# Patient Record
Sex: Female | Born: 1960 | Race: Black or African American | Hispanic: No | Marital: Single | Smoking: Former smoker
Health system: Southern US, Community
[De-identification: ages and names within clinical notes are randomized; demographics above are authoritative.]

## PROBLEM LIST (undated history)

## (undated) DIAGNOSIS — Z9289 Personal history of other medical treatment: Secondary | ICD-10-CM

## (undated) DIAGNOSIS — I10 Essential (primary) hypertension: Secondary | ICD-10-CM

## (undated) DIAGNOSIS — M549 Dorsalgia, unspecified: Secondary | ICD-10-CM

## (undated) DIAGNOSIS — I4891 Unspecified atrial fibrillation: Secondary | ICD-10-CM

## (undated) DIAGNOSIS — R569 Unspecified convulsions: Secondary | ICD-10-CM

## (undated) HISTORY — DX: Personal history of other medical treatment: Z92.89

---

## 1998-02-10 ENCOUNTER — Encounter: Admission: RE | Admit: 1998-02-10 | Discharge: 1998-02-10 | Payer: Self-pay | Admitting: Sports Medicine

## 1998-05-16 ENCOUNTER — Encounter: Admission: RE | Admit: 1998-05-16 | Discharge: 1998-05-16 | Payer: Self-pay | Admitting: Family Medicine

## 1998-06-06 ENCOUNTER — Encounter: Admission: RE | Admit: 1998-06-06 | Discharge: 1998-06-06 | Payer: Self-pay | Admitting: Family Medicine

## 1998-07-02 ENCOUNTER — Encounter: Admission: RE | Admit: 1998-07-02 | Discharge: 1998-07-02 | Payer: Self-pay | Admitting: Family Medicine

## 1998-07-15 ENCOUNTER — Encounter: Admission: RE | Admit: 1998-07-15 | Discharge: 1998-07-15 | Payer: Self-pay | Admitting: Family Medicine

## 1998-07-31 ENCOUNTER — Encounter: Admission: RE | Admit: 1998-07-31 | Discharge: 1998-07-31 | Payer: Self-pay | Admitting: Family Medicine

## 1998-08-15 ENCOUNTER — Encounter: Admission: RE | Admit: 1998-08-15 | Discharge: 1998-08-15 | Payer: Self-pay | Admitting: Family Medicine

## 1998-08-25 ENCOUNTER — Encounter: Admission: RE | Admit: 1998-08-25 | Discharge: 1998-08-25 | Payer: Self-pay | Admitting: Family Medicine

## 1998-09-24 ENCOUNTER — Encounter: Admission: RE | Admit: 1998-09-24 | Discharge: 1998-09-24 | Payer: Self-pay | Admitting: Family Medicine

## 1998-11-19 ENCOUNTER — Encounter: Admission: RE | Admit: 1998-11-19 | Discharge: 1998-11-19 | Payer: Self-pay | Admitting: Family Medicine

## 1999-01-20 ENCOUNTER — Encounter: Payer: Self-pay | Admitting: Emergency Medicine

## 1999-01-20 ENCOUNTER — Emergency Department (HOSPITAL_COMMUNITY): Admission: EM | Admit: 1999-01-20 | Discharge: 1999-01-20 | Payer: Self-pay | Admitting: Emergency Medicine

## 1999-01-28 ENCOUNTER — Ambulatory Visit (HOSPITAL_BASED_OUTPATIENT_CLINIC_OR_DEPARTMENT_OTHER): Admission: RE | Admit: 1999-01-28 | Discharge: 1999-01-28 | Payer: Self-pay | Admitting: Orthopedic Surgery

## 1999-04-08 ENCOUNTER — Encounter: Admission: RE | Admit: 1999-04-08 | Discharge: 1999-04-08 | Payer: Self-pay | Admitting: Family Medicine

## 1999-07-27 ENCOUNTER — Emergency Department (HOSPITAL_COMMUNITY): Admission: EM | Admit: 1999-07-27 | Discharge: 1999-07-27 | Payer: Self-pay | Admitting: Emergency Medicine

## 1999-07-27 ENCOUNTER — Encounter: Payer: Self-pay | Admitting: Emergency Medicine

## 1999-10-05 ENCOUNTER — Encounter: Admission: RE | Admit: 1999-10-05 | Discharge: 1999-10-05 | Payer: Self-pay | Admitting: Family Medicine

## 1999-11-06 ENCOUNTER — Encounter: Admission: RE | Admit: 1999-11-06 | Discharge: 1999-11-06 | Payer: Self-pay | Admitting: Family Medicine

## 2000-02-10 ENCOUNTER — Encounter: Admission: RE | Admit: 2000-02-10 | Discharge: 2000-02-10 | Payer: Self-pay | Admitting: Family Medicine

## 2000-04-26 ENCOUNTER — Encounter: Admission: RE | Admit: 2000-04-26 | Discharge: 2000-04-26 | Payer: Self-pay | Admitting: Family Medicine

## 2000-04-26 ENCOUNTER — Other Ambulatory Visit: Admission: RE | Admit: 2000-04-26 | Discharge: 2000-04-26 | Payer: Self-pay | Admitting: Obstetrics

## 2000-05-04 ENCOUNTER — Encounter: Admission: RE | Admit: 2000-05-04 | Discharge: 2000-05-04 | Payer: Self-pay | Admitting: *Deleted

## 2000-10-20 ENCOUNTER — Encounter: Admission: RE | Admit: 2000-10-20 | Discharge: 2000-10-20 | Payer: Self-pay | Admitting: Family Medicine

## 2001-03-08 ENCOUNTER — Encounter (INDEPENDENT_AMBULATORY_CARE_PROVIDER_SITE_OTHER): Payer: Self-pay | Admitting: *Deleted

## 2001-05-18 ENCOUNTER — Encounter: Admission: RE | Admit: 2001-05-18 | Discharge: 2001-05-18 | Payer: Self-pay | Admitting: Family Medicine

## 2001-06-06 ENCOUNTER — Encounter: Admission: RE | Admit: 2001-06-06 | Discharge: 2001-06-06 | Payer: Self-pay | Admitting: Family Medicine

## 2001-08-09 ENCOUNTER — Encounter: Admission: RE | Admit: 2001-08-09 | Discharge: 2001-08-09 | Payer: Self-pay | Admitting: Family Medicine

## 2001-08-22 ENCOUNTER — Encounter: Admission: RE | Admit: 2001-08-22 | Discharge: 2001-08-22 | Payer: Self-pay | Admitting: *Deleted

## 2001-12-21 ENCOUNTER — Encounter: Admission: RE | Admit: 2001-12-21 | Discharge: 2001-12-21 | Payer: Self-pay | Admitting: Family Medicine

## 2002-01-22 ENCOUNTER — Encounter: Admission: RE | Admit: 2002-01-22 | Discharge: 2002-01-22 | Payer: Self-pay | Admitting: Family Medicine

## 2002-02-09 ENCOUNTER — Encounter: Admission: RE | Admit: 2002-02-09 | Discharge: 2002-02-09 | Payer: Self-pay | Admitting: Family Medicine

## 2002-02-14 ENCOUNTER — Encounter: Admission: RE | Admit: 2002-02-14 | Discharge: 2002-02-14 | Payer: Self-pay | Admitting: General Surgery

## 2002-02-14 ENCOUNTER — Encounter (HOSPITAL_BASED_OUTPATIENT_CLINIC_OR_DEPARTMENT_OTHER): Payer: Self-pay | Admitting: General Surgery

## 2002-02-27 ENCOUNTER — Encounter: Payer: Self-pay | Admitting: Neurology

## 2002-02-27 ENCOUNTER — Observation Stay (HOSPITAL_COMMUNITY): Admission: EM | Admit: 2002-02-27 | Discharge: 2002-02-28 | Payer: Self-pay | Admitting: Emergency Medicine

## 2002-02-27 ENCOUNTER — Encounter: Payer: Self-pay | Admitting: Emergency Medicine

## 2002-04-25 ENCOUNTER — Encounter: Payer: Self-pay | Admitting: Neurology

## 2002-04-25 ENCOUNTER — Ambulatory Visit (HOSPITAL_COMMUNITY): Admission: RE | Admit: 2002-04-25 | Discharge: 2002-04-25 | Payer: Self-pay | Admitting: Neurology

## 2002-06-13 ENCOUNTER — Encounter: Admission: RE | Admit: 2002-06-13 | Discharge: 2002-06-13 | Payer: Self-pay | Admitting: Family Medicine

## 2002-10-03 ENCOUNTER — Encounter: Admission: RE | Admit: 2002-10-03 | Discharge: 2002-10-03 | Payer: Self-pay | Admitting: Family Medicine

## 2002-10-30 ENCOUNTER — Encounter: Payer: Self-pay | Admitting: Obstetrics

## 2002-10-30 ENCOUNTER — Encounter: Admission: RE | Admit: 2002-10-30 | Discharge: 2002-10-30 | Payer: Self-pay | Admitting: Obstetrics

## 2003-01-20 ENCOUNTER — Emergency Department (HOSPITAL_COMMUNITY): Admission: AD | Admit: 2003-01-20 | Discharge: 2003-01-20 | Payer: Self-pay | Admitting: Family Medicine

## 2003-04-23 ENCOUNTER — Ambulatory Visit (HOSPITAL_COMMUNITY): Admission: RE | Admit: 2003-04-23 | Discharge: 2003-04-23 | Payer: Self-pay | Admitting: Neurology

## 2003-04-30 ENCOUNTER — Encounter: Admission: RE | Admit: 2003-04-30 | Discharge: 2003-04-30 | Payer: Self-pay | Admitting: Sports Medicine

## 2004-01-09 ENCOUNTER — Ambulatory Visit: Payer: Self-pay | Admitting: Family Medicine

## 2004-08-21 ENCOUNTER — Emergency Department (HOSPITAL_COMMUNITY): Admission: EM | Admit: 2004-08-21 | Discharge: 2004-08-21 | Payer: Self-pay | Admitting: *Deleted

## 2004-11-19 ENCOUNTER — Ambulatory Visit (HOSPITAL_COMMUNITY): Admission: RE | Admit: 2004-11-19 | Discharge: 2004-11-19 | Payer: Self-pay | Admitting: Obstetrics

## 2005-05-01 ENCOUNTER — Emergency Department (HOSPITAL_COMMUNITY): Admission: EM | Admit: 2005-05-01 | Discharge: 2005-05-01 | Payer: Self-pay | Admitting: Family Medicine

## 2006-05-05 DIAGNOSIS — K219 Gastro-esophageal reflux disease without esophagitis: Secondary | ICD-10-CM

## 2006-05-05 DIAGNOSIS — I10 Essential (primary) hypertension: Secondary | ICD-10-CM

## 2006-05-05 DIAGNOSIS — D649 Anemia, unspecified: Secondary | ICD-10-CM

## 2006-05-05 DIAGNOSIS — G44209 Tension-type headache, unspecified, not intractable: Secondary | ICD-10-CM

## 2006-05-05 DIAGNOSIS — Z720 Tobacco use: Secondary | ICD-10-CM

## 2006-05-06 ENCOUNTER — Encounter (INDEPENDENT_AMBULATORY_CARE_PROVIDER_SITE_OTHER): Payer: Self-pay | Admitting: *Deleted

## 2006-11-20 ENCOUNTER — Emergency Department (HOSPITAL_COMMUNITY): Admission: EM | Admit: 2006-11-20 | Discharge: 2006-11-20 | Payer: Self-pay | Admitting: Emergency Medicine

## 2007-05-07 ENCOUNTER — Encounter: Payer: Self-pay | Admitting: Internal Medicine

## 2007-05-07 ENCOUNTER — Ambulatory Visit: Payer: Self-pay | Admitting: Internal Medicine

## 2007-05-07 ENCOUNTER — Emergency Department (HOSPITAL_COMMUNITY): Admission: EM | Admit: 2007-05-07 | Discharge: 2007-05-07 | Payer: Self-pay | Admitting: Emergency Medicine

## 2007-06-01 ENCOUNTER — Ambulatory Visit: Payer: Self-pay | Admitting: Internal Medicine

## 2007-06-01 LAB — CONVERTED CEMR LAB
BUN: 11 mg/dL (ref 6–23)
Chloride: 101 meq/L (ref 96–112)
Glucose, Bld: 89 mg/dL (ref 70–99)
Potassium: 3.3 meq/L — ABNORMAL LOW (ref 3.5–5.1)

## 2007-06-13 ENCOUNTER — Ambulatory Visit: Payer: Self-pay | Admitting: Internal Medicine

## 2007-06-13 LAB — CONVERTED CEMR LAB
BUN: 12 mg/dL (ref 6–23)
CO2: 31 meq/L (ref 19–32)
Chloride: 101 meq/L (ref 96–112)
Creatinine, Ser: 0.9 mg/dL (ref 0.4–1.2)

## 2007-07-11 ENCOUNTER — Ambulatory Visit: Payer: Self-pay | Admitting: Internal Medicine

## 2007-08-02 ENCOUNTER — Ambulatory Visit: Payer: Self-pay | Admitting: Internal Medicine

## 2008-01-16 ENCOUNTER — Emergency Department (HOSPITAL_COMMUNITY): Admission: EM | Admit: 2008-01-16 | Discharge: 2008-01-16 | Payer: Self-pay | Admitting: Emergency Medicine

## 2008-01-31 ENCOUNTER — Ambulatory Visit: Payer: Self-pay | Admitting: Internal Medicine

## 2008-03-20 ENCOUNTER — Emergency Department (HOSPITAL_COMMUNITY): Admission: EM | Admit: 2008-03-20 | Discharge: 2008-03-20 | Payer: Self-pay | Admitting: Emergency Medicine

## 2008-11-05 DIAGNOSIS — Z87898 Personal history of other specified conditions: Secondary | ICD-10-CM

## 2008-11-05 DIAGNOSIS — D573 Sickle-cell trait: Secondary | ICD-10-CM | POA: Insufficient documentation

## 2008-11-05 DIAGNOSIS — Z8669 Personal history of other diseases of the nervous system and sense organs: Secondary | ICD-10-CM | POA: Insufficient documentation

## 2008-11-05 DIAGNOSIS — I480 Paroxysmal atrial fibrillation: Secondary | ICD-10-CM | POA: Insufficient documentation

## 2008-11-05 DIAGNOSIS — R42 Dizziness and giddiness: Secondary | ICD-10-CM

## 2008-11-15 ENCOUNTER — Telehealth: Payer: Self-pay | Admitting: Internal Medicine

## 2008-11-22 ENCOUNTER — Ambulatory Visit: Payer: Self-pay | Admitting: Internal Medicine

## 2008-11-28 ENCOUNTER — Telehealth: Payer: Self-pay | Admitting: Internal Medicine

## 2009-03-21 ENCOUNTER — Ambulatory Visit: Payer: Self-pay | Admitting: Internal Medicine

## 2009-04-18 ENCOUNTER — Telehealth: Payer: Self-pay | Admitting: Internal Medicine

## 2009-04-25 ENCOUNTER — Telehealth: Payer: Self-pay | Admitting: Internal Medicine

## 2009-05-27 ENCOUNTER — Ambulatory Visit (HOSPITAL_COMMUNITY): Admission: RE | Admit: 2009-05-27 | Discharge: 2009-05-27 | Payer: Self-pay | Admitting: Obstetrics

## 2009-06-24 ENCOUNTER — Emergency Department (HOSPITAL_COMMUNITY): Admission: EM | Admit: 2009-06-24 | Discharge: 2009-06-24 | Payer: Self-pay | Admitting: Emergency Medicine

## 2009-07-22 ENCOUNTER — Encounter: Payer: Self-pay | Admitting: Internal Medicine

## 2009-07-29 ENCOUNTER — Ambulatory Visit: Payer: Self-pay | Admitting: Internal Medicine

## 2009-09-25 ENCOUNTER — Telehealth: Payer: Self-pay | Admitting: Internal Medicine

## 2009-10-03 ENCOUNTER — Encounter: Payer: Self-pay | Admitting: Internal Medicine

## 2009-10-06 ENCOUNTER — Ambulatory Visit: Payer: Self-pay | Admitting: Internal Medicine

## 2009-10-29 ENCOUNTER — Ambulatory Visit: Payer: Self-pay | Admitting: Internal Medicine

## 2009-10-29 DIAGNOSIS — R5383 Other fatigue: Secondary | ICD-10-CM

## 2009-10-29 DIAGNOSIS — R5381 Other malaise: Secondary | ICD-10-CM

## 2009-12-13 ENCOUNTER — Emergency Department (HOSPITAL_COMMUNITY): Admission: EM | Admit: 2009-12-13 | Discharge: 2009-12-13 | Payer: Self-pay | Admitting: Emergency Medicine

## 2010-03-29 ENCOUNTER — Encounter: Payer: Self-pay | Admitting: Obstetrics

## 2010-04-05 LAB — CONVERTED CEMR LAB
Basophils Absolute: 0 10*3/uL (ref 0.0–0.1)
CO2: 28 meq/L (ref 19–32)
Calcium: 9.9 mg/dL (ref 8.4–10.5)
Creatinine, Ser: 0.9 mg/dL (ref 0.4–1.2)
Eosinophils Absolute: 0.1 10*3/uL (ref 0.0–0.7)
Glucose, Bld: 109 mg/dL — ABNORMAL HIGH (ref 70–99)
Hemoglobin: 12.8 g/dL (ref 12.0–15.0)
Lymphocytes Relative: 27.9 % (ref 12.0–46.0)
MCHC: 33.8 g/dL (ref 30.0–36.0)
Monocytes Absolute: 0.5 10*3/uL (ref 0.1–1.0)
Neutro Abs: 4.2 10*3/uL (ref 1.4–7.7)
Neutrophils Relative %: 62.4 % (ref 43.0–77.0)
RDW: 13.8 % (ref 11.5–14.6)

## 2010-04-09 NOTE — Assessment & Plan Note (Signed)
Summary: rov. syncopal, episodes / gd   Visit Type:  Follow-up Primary Provider:  Kellie Shropshire  CC:  was just put on Keppra.  History of Present Illness: [name] is a delightful 50 year old with a history of hypertension and paroxysmal atrial fibrillation who underwent cardioversion in March 2008.  She returns today for routine followup.   Had holter in 9/10 showed PVCs but no AF. No current palpitations, chest pain or shortness of breath.  Had a seizure in April. 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 Then eyes rolled back and had convulsions.  EMS at site said her heart beat was really faint. Taken to ER and brain MRI showed gliosis (no stroke) which was felt to be possible focus for seizure. No evidence of CVA. Started on Keppra. Saw Dr. Marjory Lies in neurology who is planning EEG. Asked her to come back and see [place] to r/o cardiac etiology for episode. Had similar episode of seizures in 2004.  Current Medications (verified): 1)  Benicar Hct 40-25 Mg Tabs (Olmesartan Medoxomil-Hctz) .... Once Daily 2)  Carvedilol 12.5 Mg Tabs (Carvedilol) .... Take One Tablet By Mouth Twice A Day 3)  Aspirin Ec 325 Mg Tbec (Aspirin) .... Take One Tablet By Mouth Daily 4)  Amlodipine Besylate 5 Mg Tabs (Amlodipine Besylate) .... Take One Tablet By Mouth Daily 5)  Keppra 500 Mg Tabs (Levetiracetam) .... Two Times A Day  Allergies (verified): 1)  ! 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 Lurene Shadow, h pylori + 6/03 (treated), ss trait (has had hgb >13 in past) Current Problems:  * CARDIOVERSION TUBAL LIGATION, HX OF (ICD-V26.51) DIZZINESS (ICD-780.4) ATRIAL FIBRILLATION, HX OF (ICD-V12.59) SYNCOPE, HX OF (ICD-V12.49) SICKLE CELL TRAIT (ICD-282.5) FIBROCYSTIC BREAST DISEASE, HX OF (ICD-V13.9) TOBACCO DEPENDENCE (ICD-305.1) TENSION HEADACHE (ICD-307.81) HYPERTENSION, BENIGN SYSTEMIC  (ICD-401.1) GASTROESOPHAGEAL REFLUX, NO ESOPHAGITIS (ICD-530.81) ANEMIA, OTHER, UNSPECIFIED (ICD-285.9)  Review of Systems       As per HPI and past medical history; otherwise all systems negative.   Vital Signs:  Patient profile:   50 year old female Height:      64 inches Weight:      153 pounds BMI:     26.36 Pulse rate:   71 / minute BP sitting:   122 / 80  (left arm) Cuff size:   regular  Vitals Entered By: Hardin Negus, RMA (Jul 29, 2009 4:31 PM)  Physical Exam  General:  Gen: well appearing. no resp difficulty HEENT: normal Neck: supple. no JVD. Carotids 2+ bilat; no bruits. No lymphadenopathy or thryomegaly appreciated. Cor: PMI nondisplaced. Regular rate & rhythm. No rubs, gallops. 2/6 SEM at LSB Lungs: clear Abdomen: soft, nontender, nondistended. Good bowel sounds. Extremities: no cyanosis, clubbing, rash, edema Neuro: alert & orientedx3, cranial nerves grossly intact. moves all 4 extremities w/o difficulty. affect pleasant    Impression & Recommendations:  Problem # 1:  ATRIAL FIBRILLATION, HX OF (ICD-V12.59) This has been quiescent since 2008. Holter monitor without evidence of AF. While I cannot entirely exclude arrhythmia as a cause of her seizure I think it is unlikely. Given that episodes now 7 years apart, placing another monitor on her would be of little value. Thus we have chosen a watch and wait approach. If episode recurs in near future would place 1 month event monitor. We also discussed possiblity of implantable loop recorder but once again given that episodes are 7 years apart this would be low  yield at this point. With CHADs2 score of 1 would continue ASA and not switch to coumadin.   Other Orders: EKG w/ Interpretation (93000)  Patient Instructions: 1)  follow up in 6 months

## 2010-04-09 NOTE — Procedures (Signed)
Summary: Summary Report  Summary Report   Imported By: Erle Crocker 11/19/2009 12:42:07  _____________________________________________________________________  External Attachment:    Type:   Image     Comment:   External Document

## 2010-04-09 NOTE — Miscellaneous (Signed)
Summary: Orders Update  2 week event monitor  Clinical Lists Changes  Orders: Added new Referral order of Event (Event) - Signed

## 2010-04-09 NOTE — Assessment & Plan Note (Signed)
Summary: Z6X   Primary Provider:  Kellie Shropshire   History of Present Illness: [name] is a delightful 50 year old with a history of hypertension and paroxysmal atrial fibrillation who underwent cardioversion in March 2008.  She returns today for routine followup.   Overall, she is doing very well.  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  At the end of the last year was having palpitations. Holter showed PVCs but no AF. No current palpitations, chest pain or shortness of breath. Remains active going to gym 5 days/week on TM, ellipticals and weights.   Problems Prior to Update: 1)  Cardioversion  () 2)  Tubal Ligation, Hx of  (ICD-V26.51) 3)  Dizziness  (ICD-780.4) 4)  Atrial Fibrillation, Hx of  (ICD-V12.59) 5)  Syncope, Hx of  (ICD-V12.49) 6)  Sickle Cell Trait  (ICD-282.5) 7)  Fibrocystic Breast Disease, Hx of  (ICD-V13.9) 8)  Tobacco Dependence  (ICD-305.1) 9)  Tension Headache  (ICD-307.81) 10)  Hypertension, Benign Systemic  (ICD-401.1) 11)  Gastroesophageal Reflux, No Esophagitis  (ICD-530.81) 12)  Anemia, Other, Unspecified  (ICD-285.9)  Current Medications (verified): 1)  Benicar Hct 40-25 Mg Tabs (Olmesartan Medoxomil-Hctz) .... Once Daily 2)  Atenolol 25 Mg Tabs (Atenolol) .... Take One Tablet By Mouth Two Times A Day 3)  Aspirin Ec 325 Mg Tbec (Aspirin) .... Take One Tablet By Mouth Daily  Allergies (verified): 1)  ! 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 Lurene Shadow, h pylori + 6/03 (treated), ss trait (has had hgb >13 in past) Current Problems:  * CARDIOVERSION TUBAL LIGATION, HX OF (ICD-V26.51) DIZZINESS (ICD-780.4) ATRIAL FIBRILLATION, HX OF (ICD-V12.59) SYNCOPE, HX OF (ICD-V12.49) SICKLE CELL TRAIT (ICD-282.5) FIBROCYSTIC BREAST DISEASE, HX OF (ICD-V13.9) TOBACCO DEPENDENCE (ICD-305.1) TENSION HEADACHE (ICD-307.81) HYPERTENSION, BENIGN SYSTEMIC (ICD-401.1) GASTROESOPHAGEAL  REFLUX, NO ESOPHAGITIS (ICD-530.81) ANEMIA, OTHER, UNSPECIFIED (ICD-285.9)  Review of Systems       As per HPI and past medical history; otherwise all systems negative.   Vital Signs:  Patient profile:   50 year old female Height:      64 inches Weight:      156 pounds BMI:     26.87 Pulse rate:   76 / minute BP sitting:   136 / 82  (right arm) Cuff size:   regular  Vitals Entered By: Hardin Negus, RMA (March 21, 2009 4:23 PM)  Physical Exam  General:  Gen: well appearing. no resp difficulty HEENT: normal Neck: supple. no JVD. Carotids 2+ bilat; no bruits. No lymphadenopathy or thryomegaly appreciated. Cor: PMI nondisplaced. Regular rate & rhythm. No rubs, gallops. 2/6 SEM at LSB Lungs: clear Abdomen: soft, nontender, nondistended. Good bowel sounds. Extremities: no cyanosis, clubbing, rash, edema Neuro: alert & orientedx3, cranial nerves grossly intact. moves all 4 extremities w/o difficulty. affect pleasant    Impression & Recommendations:  Problem # 1:  HYPERTENSION, BENIGN SYSTEMIC (ICD-401.1)  BP borderline. Will have her take BPs 2x day for 2 weeks and fax numbers in. If BP still up change atenolol to carvedilol 12.5 two times a day.  Her updated medication list for this problem includes:    Benicar Hct 40-25 Mg Tabs (Olmesartan medoxomil-hctz) ..... Once daily    Atenolol 25 Mg Tabs (Atenolol) .Marland Kitchen... Take one tablet by mouth two times a day    Aspirin Ec 325 Mg Tbec (Aspirin) .Marland Kitchen... Take one tablet by mouth daily  Problem # 2:  ATRIAL FIBRILLATION, HX OF (ICD-V12.59)  No further  episodes. Continue ASA.  Her updated medication list for this problem includes:    Atenolol 25 Mg Tabs (Atenolol) .Marland Kitchen... Take one tablet by mouth two times a day    Aspirin Ec 325 Mg Tbec (Aspirin) .Marland Kitchen... Take one tablet by mouth daily  Problem # 3:  TOBACCO DEPENDENCE (ICD-305.1) Counseled on need to quit.  Patient Instructions: 1)  Your physician has requested that you  regularly monitor and record your blood pressure readings at home.  Please use the same machine at the same time of day to check your readings and record them to bring to your follow-up visit. 2)  Follow up in 1 year

## 2010-04-09 NOTE — Letter (Signed)
Summary: [facility] Office Note  [facility] Office Note   Imported By: Roderic Ovens 08/15/2009 15:23:49  _____________________________________________________________________  External Attachment:    Type:   Image     Comment:   External Document

## 2010-04-09 NOTE — Progress Notes (Signed)
Summary: pt b/p meds not working   Phone Note Call from Patient Call back at [employer] [phone]   Caller: Patient Reason for Call: Talk to Nurse Summary of Call: pt was on Atenolol 50mg  and medication was changed but the new medication is not working her readings are still high and sometimes higher so can the dose be increased since the new medication is a lower dose. She already has a 90day supply of the new meds and she dosen't want to through the meds out. Initial call taken by: Omer Jack,  April 25, 2009 10:30 AM  Follow-up for Phone Call        spoke w/pt she switched atenolol over to carvedilol 12.5 two times a day on Mon 2/14, she is concerned b/c BP cont. to run high around 149-150s/80-100s, will discuss w/Dr Romyn Boswell and call pt back Meredith Staggers, RN  April 25, 2009 10:59 AM   pt rtn your call/lg Omer Jack  April 28, 2009 12:46 PM   Additional Follow-up for Phone Call Additional follow up Details #1::        add norvasc 5qd Dolores Patty, MD, [facility]  April 28, 2009 10:08 AM  Left message to call back Meredith Staggers, RN  April 28, 2009 11:53 AM   pt is aware, new rx sent in [place], RN  April 28, 2009 4:57 PM     New/Updated Medications: AMLODIPINE BESYLATE 5 MG TABS (AMLODIPINE BESYLATE) Take one tablet by mouth daily Prescriptions: AMLODIPINE BESYLATE 5 MG TABS (AMLODIPINE BESYLATE) Take one tablet by mouth daily  #30 x 6   Entered by:   Meredith Staggers, RN   Authorized by:   Dolores Patty, MD, [facility]   Signed by:   Meredith Staggers, RN on 04/28/2009   Method used:   Electronically to        [facility] [phone]* (retail)       [address]       [place], [place]  [address]       Ph: [phone] or [phone]       Fax: [phone]   RxID:   [id number]

## 2010-04-09 NOTE — Progress Notes (Signed)
Summary: blood pressure  Phone Note Call from Patient Call back at Home Phone [phone]   Caller: Patient Reason for Call: Talk to Nurse Details for Reason: Per pt calling, faxing over blood pressure reading, pt is out of her meds, was told that meds might be changed. if meds gonna changed pls [facility] [phone].  Initial call taken by: Lorne Skeens,  April 18, 2009 2:58 PM  Follow-up for Phone Call        lmtcb Scherrie Bateman, LPN  April 18, 2009 3:22 PM  returning call, Migdalia Dk  April 18, 2009 3:34 PM   I called the pt after reviewing her BP readings and they are running about the same as when she was in the office to see Dr Gala Romney.  The pt ran out of her Atenlol today and wanted to get her medication changed today if possible.  I reviewed Dr Ronae Noell's note and he did recommend changing the pt from Atenolol to Carvedilol 12.5mg  two times a day if her BP is still running around 136/82 (this was her BP on 03/21/09).  Rx for Carvedilol sent to the pharmacy.  The pt will stop Atenolol.   Follow-up by: Julieta Gutting, RN, BSN,  April 18, 2009 5:56 PM  Additional Follow-up for Phone Call Additional follow up Details #1::        agree. thanks.  Dolores Patty, MD, [facility]  April 22, 2009 12:55 PM     New/Updated Medications: CARVEDILOL 12.5 MG TABS (CARVEDILOL) Take one tablet by mouth twice a day Prescriptions: CARVEDILOL 12.5 MG TABS (CARVEDILOL) Take one tablet by mouth twice a day  #180 x 3   Entered by:   Julieta Gutting, RN, BSN   Authorized by:   Dolores Patty, MD, [facility]   Signed by:   Julieta Gutting, RN, BSN on 04/18/2009   Method used:   [facility]        [facility] [phone]* (retail)       [address]       [place], [place]  [address]       Ph: [phone] or [phone]       Fax: [phone]   RxID:   [id number]

## 2010-04-09 NOTE — Assessment & Plan Note (Signed)
Summary: 1 month rov. pt had holter monitor 10-06-09/sl   Visit Type:  Follow-up Primary Provider:  Kellie Shropshire  CC:  tired and fatigue.  History of Present Illness: [name] is a delightful 50 year old with a history of hypertension and paroxysmal atrial fibrillation who underwent cardioversion in March 2008.  She returns today for routine followup.   Had holter in 9/10 showed PVCs but no AF. No current palpitations, chest pain or shortness of breath.  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 Then eyes rolled back and had convulsions.  EMS at site said her heart beat was really faint. Taken to ER and brain MRI showed gliosis (no stroke) which was felt to be possible focus for seizure. No evidence of CVA. Started on Keppra. Saw Dr. Marjory Lies in neurology who did EEG which was normal.  Returns today for f/u. No recurrent seizures or syncope. Compliant with all her medicines. Overall doing ok but has periods when she is extremely fatigued. Has not taken her BP during the episodes. No palpitations.   Had Holter monitor earlier this month which I reviewed with her. Sinus rhythm. No arrhythmias.   Current Medications (verified): 1)  Benicar Hct 40-25 Mg Tabs (Olmesartan Medoxomil-Hctz) .... Once Daily 2)  Carvedilol 12.5 Mg Tabs (Carvedilol) .... Take One Tablet By Mouth Twice A Day 3)  Aspirin Ec 325 Mg Tbec (Aspirin) .... Take One Tablet By Mouth Daily 4)  Amlodipine Besylate 5 Mg Tabs (Amlodipine Besylate) .... Take One Tablet By Mouth Daily 5)  Keppra 500 Mg Tabs (Levetiracetam) .... Two Times A Day  Allergies (verified): 1)  ! Pcn  Review of Systems       As per HPI and past medical history; otherwise all systems negative.   Vital Signs:  Patient profile:   50 year old female Height:      64 inches Weight:      150 pounds BMI:     25.84 Pulse rate:   69 / minute BP sitting:   128 / 84  (left arm) Cuff size:    regular  Vitals Entered By: Hardin Negus, RMA (October 29, 2009 9:33 AM)  Physical Exam  General:  Well appearing. no resp difficulty HEENT: normal Neck: supple. no JVD. Carotids 2+ bilat; no bruits. No lymphadenopathy or thryomegaly appreciated. Cor: PMI nondisplaced. Regular rate & rhythm. No rubs, gallops. 2/6 SEM at LSB Lungs: clear Abdomen: soft, nontender, nondistended. Good bowel sounds. Extremities: no cyanosis, clubbing, rash, edema Neuro: alert & orientedx3, cranial nerves grossly intact. moves all 4 extremities w/o difficulty. affect pleasant    Impression & Recommendations:  Problem # 1:  HYPERTENSION, BENIGN SYSTEMIC (ICD-401.1) Blood pressure well controlled. Continue current regimen.  Problem # 2:  FATIGUE / MALAISE (ICD-780.79) Unclear what to make of this. Doesn't appear  to have sleep apnea. Will have her check BP during episodes and will also check baseline labs including CBC and TSH. She will f/u with Dr. Renae Gloss.   Problem # 3:  ATRIAL FIBRILLATION, HX OF (ICD-V12.59) This has been quiescent since 2008. Continue current regimen.   Other Orders: EKG w/ Interpretation (93000) TLB-BMP (Basic Metabolic Panel-BMET) (80048-METABOL) TLB-CBC Platelet - w/Differential (85025-CBCD) TLB-TSH (Thyroid Stimulating Hormone) (84443-TSH) TLB-T4 (Thyrox), Free [phone])  Patient Instructions: 1)  Labs today 2)  Your physician wants you to follow-up in:  1 year.  You will receive a reminder letter in the mail two months in advance. If  you don't receive a letter, please call our office to schedule the follow-up appointment.

## 2010-04-09 NOTE — Progress Notes (Signed)
Summary: pt heart flutter/ would like to see Dr Gala Romney  Phone Note Call from Patient Call back at Home Phone [phone]   Caller: Patient Reason for Call: Talk to Nurse, Talk to Doctor Summary of Call: pt heart is fluttering and it is getting worse and she thinks it's her seizure meds that is causing it and she wants to come in but first avail is Sept and she wants to caome sooner Initial call taken by: Omer Jack,  September 25, 2009 12:02 PM  Follow-up for Phone Call        Left message to call back, if not urgent can see Dr Gala Romney 8/3 at 10:45 Meredith Staggers, RN  September 25, 2009 5:47 PM   RN Left Message To Call Back. Bernita Raisin, RN, BSN  October 02, 2009 11:06 AM  RN s/w Pt concerned re: medication Sheralyn Boatman) for seizures and mentioned a monitor and she needs to see Dr Gala Romney sooner.Marland Kitchen Her heart is still fluttering, beginning to have twitches in calves, toes, lips and cheeks uncontrollably and feels like her vision is blurry @ times. Bernita Raisin, RN, BSN  October 02, 2009 3:01 PM   Additional Follow-up for Phone Call Additional follow up Details #1::        Place 2 week event monitor. Needs to talk to her neurologist about stopping Keppra not me. I will see her after the event monitor is done. Dolores Patty, MD, [facility]  October 02, 2009 7:00 PM      Appended Document: pt heart flutter/ would like to see Dr Gala Romney pt aware of the above.  Aware the order will be placed and Windell Moulding will call her back to schedule monitor placement

## 2010-05-26 LAB — CBC
MCHC: 35 g/dL (ref 30.0–36.0)
Platelets: 195 10*3/uL (ref 150–400)
RBC: 3.98 MIL/uL (ref 3.87–5.11)
RDW: 13.3 % (ref 11.5–15.5)

## 2010-05-26 LAB — BASIC METABOLIC PANEL
BUN: 10 mg/dL (ref 6–23)
CO2: 28 mEq/L (ref 19–32)
Calcium: 8.8 mg/dL (ref 8.4–10.5)
Creatinine, Ser: 0.76 mg/dL (ref 0.4–1.2)
GFR calc Af Amer: >60 mL/min (ref >60)
Glucose, Bld: 92 mg/dL (ref 70–99)

## 2010-05-26 LAB — DIFFERENTIAL
Basophils Absolute: 0 10*3/uL (ref 0.0–0.1)
Basophils Relative: 0 % (ref 0–1)
Eosinophils Absolute: 0.1 10*3/uL (ref 0.0–0.7)
Neutro Abs: 5.5 10*3/uL (ref 1.7–7.7)
Neutrophils Relative %: 69 % (ref 43–77)

## 2010-05-26 LAB — POCT CARDIAC MARKERS
CKMB, poc: <1 ng/mL — ABNORMAL LOW (ref 1.0–8.0)
Myoglobin, poc: 43.4 ng/mL (ref 12–200)

## 2010-05-26 LAB — URINALYSIS, ROUTINE W REFLEX MICROSCOPIC
Glucose, UA: NEGATIVE mg/dL
Hgb urine dipstick: NEGATIVE
Ketones, ur: NEGATIVE mg/dL
Protein, ur: NEGATIVE mg/dL
pH: 7 (ref 5.0–8.0)

## 2010-07-21 NOTE — Assessment & Plan Note (Signed)
[facility]                            CARDIOLOGY OFFICE NOTE   NAME:[name], [name]                     MRN:          [id number]  DATE:01/31/2008                            DOB:          Jun 05, 1960    PRIMARY CARE PHYSICIAN:  Merlene Laughter. Renae Gloss, MD   INTERVAL HISTORY:  Clois Dupes is a delightful 50 year old with a history of  hypertension and paroxysmal atrial fibrillation who underwent  cardioversion in March 2008.  She returns today for routine followup.   Overall, she is doing very well.  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  She does note one episode of brief palpitations that lasted 2-3  minutes while she was at work under a great deal of stress.  Otherwise,  she has not had any other palpitations.  She denies any chest pain or  shortness of breath.   CURRENT MEDICATIONS:  1. Aspirin 325 a day.  2. Atenolol 50 a day.  3. Benicar/hydrochlorothiazide 40/25.  4. Multivitamins.  5. Vitamin B.  6. Estroven.  7. Diclofenac.   PHYSICAL EXAMINATION:  GENERAL:  She is well appearing in no acute  distress, ambulates around in the clinic without any respiratory  difficulty.  VITAL SIGNS:  Blood pressure initially is 124/82, on manual recheck  132/82.  Heart rate is 71.  Weight is 155.  HEENT:  Normal.  NECK:  Supple.  No JVD.  Carotids are 2+ bilaterally without any bruits.  There is no lymphadenopathy or thyromegaly.  CARDIAC:  PMI is nondisplaced.  Regular rate and rhythm.  No murmurs,  rubs, or gallops.  LUNGS:  Clear.  ABDOMEN:  Soft, nontender, nondistended.  No hepatosplenomegaly.  No  bruits.  No masses.  Good bowel sounds.  EXTREMITIES:  Warm with no cyanosis, clubbing, or edema.  No rash.  NEUROLOGIC:  Alert and oriented x3.  Cranial nerves II through XII are  intact.  Moves all 4 extremities without difficulty.  PSYCHIATRIC:  Affect is pleasant.   EKG shows normal sinus rhythm at a rate of 71 with LVH.   ASSESSMENT AND  PLAN:  1. Paroxysmal atrial fibrillation.  This seems quiescent.  Her      congestive heart failure, hypertension, age, diabetes, prior stroke      is 1.  We will keep her on aspirin.  2. Hypertension.  Blood pressure is a bit borderline.  I would like to      see all her systolics under 130.  I have recommended she get home      blood pressure cuff and monitor this more closely with a blood      pressure log and follow up with Dr. Renae Gloss.     Bevelyn Buckles. Bensimhon, MD  Electronically Signed    DRB/MedQ  DD: 01/31/2008  DT: 02/01/2008  Job #: [id number]

## 2010-07-21 NOTE — Assessment & Plan Note (Signed)
[facility]                            CARDIOLOGY OFFICE NOTE   NAME:[name], [name]                     MRN:          [id number]  DATE:06/01/2007                            DOB:          1960/09/24    PRIMARY CARDIOLOGIST:  Dr. Arvilla Meres.   PRIMARY CARE DOCTOR:  Merlene Laughter. Renae Gloss, M.D.   PATIENT PROFILE:  A 50 year old African female with history of  hypertension and paroxysmal atrial fibrillation, who presents for  followup after recent cardioversion problems:  1. Atrial fibrillation.      a.     May 07, 2007, a 2-D echocardiogram, EF 65%, no regional       wall motion abnormalities.  Mildly increased LV wall thickness.       Trivial AI.      b.     Status post successful cardiac cardioversion May 07, 2007.  2. Poorly-controlled hypertension.  3. Ongoing tobacco abuse.   HISTORY OF PRESENT ILLNESS:  A 50 year old African female with the above  problem list.  She was recently hospitalized on May 07, 2007, secondary  to tachy palpitations and was found to be in a-fib, which was reasonably  well rate controlled.  Given the short duration of her a-fib, she was  successfully cardioverted by Dr. Gala Romney and was placed on aspirin  therapy.  She has previously been on atenolol therapy, and this was  continued.  Since her discharge, she has done reasonably well, although  has noted 2 episodes of tachy palpitations, just lasting a couple of  minutes and resolving spontaneously.  She says she could feel it up in  her throat.  There was no associated chest pain or dyspnea.  She  unfortunately continues to smoke, as well as drinking two to three cups  of caffeinated coffee and one caffeinated soft drink a day.  Today, she  is in sinus rhythm at 87 beats a minute.   ALLERGIES:  PENICILLIN.   HOME MEDICATIONS:  1. Aspirin 325 mg daily.  2. Atenolol 50 mg daily.  3. Benicar HCT 40/25 mg daily.   PHYSICAL EXAM:  VITAL SIGNS:  Blood  pressure 160/96, heart rate 87,  respirations 16, weight is 147 pounds.  GENERAL:  Pleasant African-American female in no acute distress, awake  and alert, oriented x3.  HEENT:  Normal.  NEURO:  Grossly intact, nonfocal.  SKIN:  Warm and dry without lesions or masses.  NECK:  No bruits or JVD.  LUNGS:  Respirations are regular, unlabored, clear to auscultation.  CARDIAC:  Regular S1, S2, no S3-S4, murmurs.  ABDOMEN:  Round, soft, nontender, nondistended.  Bowel sounds present  x4.  EXTREMITIES:  Warm, dry, pink.  No clubbing, cyanosis or edema.  Dorsalis pedis, posterior tibial pulses 2+ and equal bilaterally.   ACCESSORY CLINICAL FINDINGS:  Event recorder his pending system.   ASSESSMENT/PLAN:  1. Atrial fibrillation.  The patient is status post cardioversion      earlier this month and is in sinus rhythm today, although she has      had at least two episodes of tachy palpitations.  These were fairly      short-lived, and it is not clear at to whether or not these were      atrial fibrillation.  She is not on Coumadin with an [place] score of      one.  I have gone ahead and ordered a CardioNet to better evaluate      her tachy palpitations and to determine her burden of atrial      fibrillation.  As her resting heart rate is 87, I have also      increased her atenolol 250 mg b.i.d..  Will have her follow up with      Dr. Gala Romney in approximately one month.  2. Hypertension.  This is poorly controlled.  We will start by      increasing atenolol 50 b.i.d. as above.  She remains on Benicar      HCTZ 40/25 daily.  If her pressure remains elevated on followup,      would likely plan to add Norvasc therapy.  3. Disposition.  The patient will wear a CardioNet monitor as above,      follow with Dr. Gala Romney in roughly 1 month.      Nicolasa Ducking, ANP  Electronically Signed      Duke Salvia, MD, [facility]  Electronically Signed   CB/MedQ  DD: 06/01/2007  DT: 06/02/2007  Job  #: [id number]

## 2010-07-21 NOTE — Discharge Summary (Signed)
[name], [name]              ACCOUNT NO.:  [id number]   MEDICAL RECORD NO.:  [id number]          PATIENT TYPE:  INP   LOCATION:  1829                         FACILITY:  [facility]   PHYSICIAN:  Beckey Rutter, MD  DATE OF BIRTH:  1960-06-04   DATE OF ADMISSION:  05/07/2007  DATE OF DISCHARGE:  05/07/2007                               DISCHARGE SUMMARY   PRIMARY CARE PHYSICIAN:  CHIEF COMPLAINT and HISTORY OF PRESENT ILLNESS  This is 50 year old pleasant African-American female with a past medical  history of tobacco abuse presented today with heart tracing.   The patient found to have atrial fibrillation with rate control.  The  patient was seen, and evaluated by Dr. Arvilla Meres, who performed  at cardioversion DC-CV.  This patient, now, is on sinus.  She is  comfortable.  Her first enzymes are negative and she has low-risk for  recurrence.  The patient also has a 2-D echo which is suggestive for  LVH.  The patient is stable for discharge, today.   DISCHARGE DIAGNOSES:  1. Atrial fibrillation with rate control converted to sinus in the      emergency department, today.  2. Hypertension.  3. Tobacco abuse.   DISCHARGE MEDICATIONS:  1. Aspirin 325 mg p.o. daily.  2. Atenolol.  3. Benicar dose as prior to the admission.   DISCHARGE/PLAN:  The patient is discharged, today, to follow up with Dr.  Arvilla Meres as discussed with her.      Beckey Rutter, MD  Electronically Signed     EME/MEDQ  D:  05/07/2007  T:  05/08/2007  Job:  [id number]

## 2010-07-21 NOTE — H&P (Signed)
[name], [name]              ACCOUNT NO.:  [id number]   MEDICAL RECORD NO.:  [id number]          PATIENT TYPE:  INP   LOCATION:  1829                         FACILITY:  [facility]   PHYSICIAN:  Della Goo, M.D. DATE OF BIRTH:  02/16/1961   DATE OF ADMISSION:  05/07/2007  DATE OF DISCHARGE:  05/07/2007                              HISTORY & PHYSICAL   PRIMARY CARE PHYSICIAN:  Cala Bradford R. Renae Gloss, M.D.   CHIEF COMPLAINT:  Palpitations.   HISTORY OF PRESENT ILLNESS:  This is a 50 year old female presenting to  the emergency department with complaints of palpitations which have been  occurring for the past 24 hours.  She reports having shortness of breath  and lightheadedness associated with this.  She denies having any chest  pain, nausea or vomiting.  She denies having any previous similar  episodes.  The patient was evaluated in the emergency department and  found to be in atrial fibrillation.  However, the rate was controlled  with rates of 64-82.  The patient was referred for admission and further  evaluation and treatment of the new atrial fibrillation.   PAST MEDICAL HISTORY:  Significant for hypertension.   PAST SURGICAL HISTORY:  None.   MEDICATIONS:  Include atenolol and Benicar.   ALLERGIES:  To PENICILLIN.   SOCIAL HISTORY:  The patient is a smoker, smokes 1-1/2 packs per day for  20 years.  She also drinks socially 3-4 beers on weekends.   FAMILY HISTORY:  Positive for sickle cell disease in her mother and a  sister with a sickle cell variant.  No history of coronary artery  disease in her family.  Positive hypertension in her mother and 2  brothers.  No diabetes in her family.  No cancer in her family that she  knows of.   REVIEW OF SYSTEMS:  Pertinents as mentioned above.   PHYSICAL EXAMINATION:  GENERAL:  This is a 50 year old well-nourished,  well-developed female in no acute distress currently.  VITAL SIGNS:  Her vital signs are temperature 97.4,  blood pressure  initially 174/110, heart rate 64, respirations 16.  The patient was  administered labetalol x1 dose and her blood pressure did improve and  decreased to 151/103 with a heart rate of 82.  HEENT:  Normocephalic, atraumatic.  Pupils equally round and reactive to  light.  Extraocular muscles are intact.  Funduscopic benign.  Oropharynx  is clear.  NECK:  Neck is supple, full range of motion.  No thyromegaly, adenopathy  or jugular venous distention.  HEART:  Regular rhythm.  No murmurs, gallops or rubs.  LUNGS:  Clear to auscultation bilaterally.  ABDOMEN:  Positive bowel sounds, soft, nontender, nondistended.  EXTREMITIES:  Without cyanosis, clubbing or edema.  NEUROLOGICAL:  Examination nonfocal.   LABORATORY STUDIES:  Hemoglobin 13.9 on ISTAT and hematocrit 41.0,  sodium 139, potassium 3.5, chloride 108, bicarb 23.5, BUN 12, creatinine  1.0 and glucose 101.  Pro-time 12.4, INR 0.9.  Cardiac enzymes with a  myoglobin of 28.6, CK-MB less than 1.0, troponin less than 0.05.  EKG  reveals atrial fibrillation which as reported above is  rate controlled.  Also there are ST-segment changes but these were present on previous EKG  with hospitalization.   ASSESSMENT:  A 50 year old female being admitted with:  1. Palpitations.  2. New atrial fibrillation.  3. Hypertension.   PLAN:  The patient will be admitted to the telemetry area for cardiac  monitoring.  Oral Cardizem therapy has been ordered q.6 h. 30 mg  tablets.  IV Lopressor has also been ordered p.r.n.  A cardiology  consultation will also be placed.  Full dose Lovenox therapy and the  Coumadin protocol will be initiated.  A 2-D echo study has also been  ordered.      Della Goo, M.D.  Electronically Signed     HJ/MEDQ  D:  05/07/2007  T:  05/08/2007  Job:  [id number]   cc:   Merlene Laughter. Renae Gloss, M.D.

## 2010-07-21 NOTE — H&P (Signed)
[name], [name]              ACCOUNT NO.:  [id number]   MEDICAL RECORD NO.:  [id number]          PATIENT TYPE:  INP   LOCATION:  1829                         FACILITY:  [facility]   PHYSICIAN:  [name], MDDATE OF BIRTH:  1961/02/21   DATE OF ADMISSION:  05/07/2007  DATE OF DISCHARGE:  05/07/2007                              HISTORY & PHYSICAL   CARDIOLOGIST:  New, being seen by Dr. Gala [name].   PRIMARY CARE PHYSICIAN:  [name].   We were asked by [facility] services to evaluate this 50 year old African-  American female with new onset atrial fib, around 7 o'clock Saturday  evening.  [name] states she was sitting watching TV, had sudden  onset of fluttering in her chest.  She described it as a sporadic  sensation that took her breath away.  She states she stood up to go the  bathroom, she got dizzy.  It seemed like the palpitations were worse  when she was moving around, tried to sleep last night, unable to rest  because of the fluttering in her chest.  She came to the emergency room  for further evaluation.  In the ER, she was found to be in atrial fib at  a rate of 69, with a blood pressure of 171/110.  The patient was treated  with labetalol.  Lovenox and warfarin was started by [employer]  physician, also.  [name] states she has had bronchitis three times  this winter, just recently 2 weeks ago was her last case.  She is also  concerned about anticoagulation.  She states her mother has sickle cell  disease, has some sort of cardiac abnormality, was put on heparin and  started bleeding while she was on heparin, but apparently is on Coumadin  and has had no problems with the Coumadin.  She also states she had a  sister with sickle cell trait, who started having some GI bleeding in  her stomach, not sure of etiology, states her sister is being worked up  for this, but they think she has a type of sickle cell.   ALLERGIES:  PENICILLIN.   AT HOME  MEDICATIONS:  Includes Atenolol and Benicar, unclear doses right  now.   PAST MEDICAL HISTORY:  Includes hypertension.  [name] states she  developed it during the pregnancy of her last child 12 years ago and  sickle cell trait, fibrocystic breast disease, history of syncope x1  associated with vertigo and viral illness in 2002.  She is also status  post tubal ligation.   SOCIAL HISTORY:  She lives in [place].  She lives with her two  daughters.  She works in Presenter, broadcasting for the [employer]  She smokes one pack of cigarettes a day, drinks EtOH on the weekend.  She states average of 4 beers, denies any drug or herbal medication use.   FAMILY HISTORY:  Mother alive with some type of cardiac workup.  Ms.  [name] is not sure.  She states she does have sickle cell disease.  Father deceased, unknown.  Siblings with known sickle cell trait.  REVIEW OF SYSTEMS:  Positive for sweats, shortness of breath,  palpitations, dizziness, numbness in extremities and lips at occasions.  All other systems reviewed and negative.   PHYSICAL EXAM:  VITAL SIGNS:  Temperature 97.4,  pulse 64-79, heart rate  16, blood pressure currently 144/95.  GENERAL:  In no acute distress.  HEENT:  Normal.  NECK:  Supple without lymphadenopathy, no bruit, no JVD.  CARDIOVASCULAR:  Exam reveals S1-S2, irregular rhythm.  LUNGS:  Clear to auscultation.  ABDOMEN:  Soft, nontender, positive bowel sounds.  LOWER EXTREMITIES:  Without clubbing, cyanosis or edema.  NEUROLOGICAL:  Alert and oriented x3.  Chest x-ray showing no acute  findings.   EKG appears to be fib flutter at a rate of 69, H&H 13.9 and 41.  Sodium  139, potassium 3.5, BUN 12, creatinine 1.0, glucose 101. Point of cares  negative x1, first set of enzymes, troponin less than 0.01, PT/INR 12.4  and 0.9.   IMPRESSION:  1. Fib flutter, new onset with a [place] score of 1, rate currently      controlled.  2. Hypertension.  3. Ongoing  tobacco use.   PLAN:  To check a TSH, a 2-D echocardiogram, check a D-dimer, check a  urine drug screen, keep the patient n.p.o., consider cardioversion. Will  defer to Dr. Gala [name], who has been in to examine and assess patient  with reasonable plan of care.      [name], ACNP      [name] Buckles. Bensimhon, MD  Electronically Signed    MB/MEDQ  D:  05/07/2007  T:  05/07/2007  Job:  [id number]

## 2010-07-21 NOTE — Assessment & Plan Note (Signed)
 HEALTHCARE                            CARDIOLOGY OFFICE NOTE   NAME:Dasher, Ria Clock                     MRN:          [id number]  DATE:07/11/2007                            DOB:          [birth year]    PRIMARY CARE PHYSICIAN:  Merlene Laughter. Renae Gloss, M.D.   INTERVAL HISTORY:  [name] is a delightful 50 year old woman with  history of hypertension and paroxysmal atrial fibrillation as well as  tobacco use who recently underwent cardioversion for paroxysmal atrial  fibrillation on May 07, 2007.  She returns today for routine followup.   She has been doing great.  She wore a monitor with no episodes of atrial  fibrillation.  She was at the gym working out.  She is limiting her  caffeine intake.  She has not had any problems.   CURRENT MEDICATIONS:  1. Aspirin 325 a day.  2. Atenolol 50 a day.  3. Benicar/hydrochlorothiazide 40/25.  4. Multivitamin.  5. Iron.   PHYSICAL EXAMINATION:  GENERAL:  She is well appearing, no acute  distress.  VITAL SIGNS:  Blood pressure is 112/72, heart rate 68. Weight is 153.  HEENT:  Normal.  NECK:  Supple.  No JVD.  Carotid 2+ bilaterally without bruits, no  lymphadenopathy or thyromegaly.  CARDIAC:  Regular rate and rhythm.  No murmurs, rubs or gallops.  LUNGS:  Clear.  ABDOMEN:  Soft, nontender, nondistended.  No hepatosplenomegaly, no  bruits, no masses.  Good bowel sounds.  EXTREMITIES:  Warm with no cyanosis, clubbing or edema.  No rash.  NEUROLOGIC:  Alert and x3.  Cranial nerves II-XII are intact.  Moves all  four extremities without difficulty.  Affect is very pleasant.   Review of monitor shows sinus rhythm with no evidence of atrial  fibrillation.   ASSESSMENT AND PLAN:  1. Paroxysmal atrial fibrillation.  This has not recurred.  Her CHADS      score is 1.  We previously discussed aspirin versus Coumadin.  At      this point, we have decided to proceed with full-dose aspirin.      Should she have  recurrence, we may consider Coumadin.  2. Hypertension, well controlled.   DISPOSITION:  Will see her back for routine followup in 6 months.  She  knows to get in touch with me should she have recurrent palpitations or  any other problems.     Bevelyn Buckles. Bensimhon, MD  Electronically Signed    DRB/MedQ  DD: 07/11/2007  DT: 07/11/2007  Job #: [id number]   cc:   Merlene Laughter. Renae Gloss, M.D.

## 2010-07-21 NOTE — Op Note (Signed)
NAMEMARIANE, [name]              ACCOUNT NO.:  [id number]   MEDICAL RECORD NO.:  [id number]          PATIENT TYPE:  INP   LOCATION:  1829                         FACILITY:  [facility]   PHYSICIAN:  Bevelyn Buckles. Bensimhon, MDDATE OF BIRTH:  [birth year]   DATE OF PROCEDURE:  05/07/2007  DATE OF DISCHARGE:  05/07/2007                               OPERATIVE REPORT   TYPE OF PROCEDURE:  Electrical cardioversion.   INDICATION:  New-onset atrial fibrillation.   PATIENT IDENTIFICATION:  [name] is a 50 year old woman with  paroxysmal atrial fibrillation.  She developed symptomatic palpitations  last night.  She came in and was found to be in atrial fibrillation with  a relatively controlled ventricular response.  Echocardiogram showed  normal LV function with moderate LVH.  We initially considered  attempting to cardiovert her with flecainide but given her LVH I felt IC  antiarrhythmics were relatively contraindicated.  She has been treated  with Lovenox and aspirin here.  I have discussed the risks and benefits  of cardioversion with her including a very small risk of stroke.  She  has agreed to proceed.   PROCEDURE DESCRIPTION:  She was sedated by Dr. Gelene Mink in  anesthesiology with 250 mg of sodium Pentothal.  After adequate  anesthesia was achieved she was given a single biphasic 150-joule shock  with prompt conversion to normal sinus rhythm.  There are no immediate  complications.   DISPOSITION:  She will be discharged home today and follow up with me in  clinic in 2 weeks.  Given that her CHADS score was 1 we decided to  forego Coumadin anticoagulation and will treat her with full-dose  aspirin 325 mg a day.      Bevelyn Buckles. Bensimhon, MD  Electronically Signed     DRB/MEDQ  D:  05/07/2007  T:  05/07/2007  Job:  [id number]

## 2010-07-24 NOTE — H&P (Signed)
NAME:  [name], [name]                        ACCOUNT NO.:  [id number]   MEDICAL RECORD NO.:  [id number]                   PATIENT TYPE:  EMS   LOCATION:  MAJO                                 FACILITY:  [facility]   PHYSICIAN:  Gustavus Messing. Orlin Hilding, M.D.          DATE OF BIRTH:  February 02, 1961   DATE OF ADMISSION:  02/27/2002  DATE OF DISCHARGE:                                HISTORY & PHYSICAL   CHIEF COMPLAINT:  Passing out an dizzy.   HISTORY OF PRESENT ILLNESS:  The patient is a 50 year old right-handed black  woman with a history of hypertension.  She is generally in good health.  Last night she went to bed as usual, woke up at about 1 o'clock in the  morning to go to the bathroom, felt hot and dizzy and then passed out.  She  came to quickly, unclear if she hit her head.  No incontinence, no tongue  laceration.  She tried to get up but felt dizzy again and had the sensation  of falling. She crawled into her bed to rest awhile, tried to get up and go  to the living room and once again felt dizzy like she was falling.  She also  had some nausea and vomiting.  She called her boyfriend to come over and  take her to the hospital.   REVIEW OF SYSTEMS:  She complains of a tender spot in the right posterior  head region for the last three days or so prior to this event.  Some vague  vision blurring, nothing specific  No true diplopia or amaurosis.  No chest  pain, shortness of breath, GI pain.  No symptoms compatible with transverse  myelitis or anything to suggest MS.   PAST MEDICAL HISTORY:  1. Hypertension.  2. Fibrocystic breast disease.  3. Sickle cell trait.  4. Recent URI.   MEDICATIONS:  1. Atenolol 25 mg a day.  2. Hydrochlorothiazide 25 mg a day.   ALLERGIES:  PENICILLIN.   SOCIAL HISTORY:  Lives with daughter, is a cigarette user of about one-half  pack a day.  Drinks beer and caffeine.  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   FAMILY  HISTORY:  Positive for sickle cell disease.   PHYSICAL EXAMINATION:  VITAL SIGNS:  Temperature T-max 100, blood pressure  107/61, pulse 71, respirations 20, 97% saturation.  HEENT:  Head is normocephalic and atraumatic.  NECK:  Supple without bruits.  HEART: Regular rate and rhythm without murmurs.  LUNGS:  Clear to auscultation.  EXTREMITIES:  Without edema.  ABDOMEN:  Positive bowel sounds, soft and nontender.  NEUROLOGIC:  Mental Status: She is awake and alert.  Normal language.  She  is oriented and appropriate. Cranial Nerves:  Pupils are equal and reactive.  Disk margins are sharp. Visual fields full to confrontation.  Extraocular  movements are intact without nystagmus ophthalmoparesis, or psychosis.  Facial sensation is normal (  7) facial motor activity is intact without droop  or  asymmetry.  Hearing is intact to finger scratch and voice.  (9, 10, 12)  Palate symmetric and tongue midline.  (11)  She has normal shoulder shrug.  Motor exam.  She has normal bulk, tone, and strength throughout with 5/5  strength in all four extremities.  No drift or satelliting, fasciculations,  atrophy, or tremor.  Reflexes 1+ with downgoing toes.  Coordination finger-  to-nose, rapid alternating movements, heel-to-shin are normal.  Sensory exam  is intact to light tough, temperature, and vibration.   LABORATORY DATA:  MRI of the brain is essentially normal except for a blush  or cloud in the right temporooccipital juncture which seems to have pre-  contrast increased signal in the T1  radiofrequency, cannot tell if it  enhances.  Also noted increased signal on flare.  Unclear of the  significance of this.  Posterior fossa is normal.   MRA is normal.   White blood cell count is 8.8, hemoglobin 12.3, hematocrit 36.1, platelets  238.  Sodium 138, potassium 3.6, chloride 108, CO2 23, BUN 14, creatinine  0.7, glucose 97, calcium 9.4, protein 7.5, albumin 3.9.  SGOT 23, SGPT 23,  alkaline  phosphatase 33, bilirubin 1.0.  PTT 26, PT 13.0, INR 0.9.   IMPRESSION:  Syncope, dizziness, vertigo. Rule out seizure.  Rule out  neurocardiogenic syncope.  Unclear if the a brain cloud on MRI is  significant or relevant.   PLAN:  Admit to telemetry.  Check EKG, EEG, sickle cell count, and  orthostatics.  Hopefully we will able to discharge if no further events.  She will need a followup MRI in two to three months with magnetization  transfer pre-contrast done.                                               Catherine A. Orlin Hilding, M.D.    CAW/MEDQ  D:  02/27/2002  T:  02/27/2002  Job:  [id number]

## 2010-07-24 NOTE — Discharge Summary (Signed)
NAME:  KRIMSON, MASSMANN                        ACCOUNT NO.:  [id number]   MEDICAL RECORD NO.:  [id number]                   PATIENT TYPE:  INP   LOCATION:  3030                                 FACILITY:  [facility]   PHYSICIAN:  Candy Sledge, M.D.            DATE OF BIRTH:  [birth year]   DATE OF ADMISSION:  02/27/2002  DATE OF DISCHARGE:  02/28/2002                                 DISCHARGE SUMMARY   BRIEF HISTORY:  For complete details of Chief Complaint and History of  Present Illness, Past Medical History, and Physical Examination, please  review Dr. Stark Klein admission History and Physical.   Briefly, the patient is a 50 year old, right-handed patient with a history  of hypertension.  She has generally been in good health.  She presented on  the morning of admission after awakening earlier that morning and  complaining of feeling hot and dizzy, then passing out.  She had some  subsequent episodes of dizziness as though she were falling. This was  associated with some nausea and vomiting.  She had been complaining of a  tender spot over the right posterior head region for about three days prior  to admission and some vague blurriness of vision.   Her examination on admission showed low-grade temperature of 100 degrees.  Blood pressure 107/61, pulse 71, respirations 20, O2 saturation 97%.  Her  neurological examination was nonfocal.   The patient did undergo an MRI of the brain at time of admission showing a  blush in the right temporooccipital junction which had increased signal on  pre-contrast and post-contrast T1 as well as on the flair study.  The  patient was admitted with a diagnosis of syncope/dizziness/vertigo/rule out  seizure.   LABORATORY DATA:  Admission CBC and differential showed white cell count  8200, hemoglobin 12.3, hematocrit 36.1, platelet count 238,000.  There were  68% neutrophils, 22% lymphocytes, 9% monocytes. Comprehensive metabolic  panel  was normal apart from low alkaline phosphatase 33 units/liter.  PT and  PTT were 13 and 26 seconds, respectively.  Urinalysis showed 0 to 2 wbc's, 0  to 2 rbc's, and leukocyte esterase negative.   CT scan of the head 02/27/2002 showed no evidence of acute intracranial  abnormality.   Telemetry strips showed normal sinus rhythm.  The patient had an MRI of the  brain as noted above.   She also underwent an EEG on 02/28/2002 which was entirely normal during the  racing state.   HOSPITAL COURSE:  The patient was admitted to the neurosciences unit.  Vital  signs were generally very acceptable with low-normal blood pressures.  T-max  on the day of discharge was 99 with subsequent temperatures being normal.  The patient had a good appetite. She had no further episodes of  dizziness/vertigo or witnessed seizure-like activity.  She was felt to have  arrived at maximum hospital benefit and was discharged to home.   I  should mention that, as part of her laboratory workup, she did have a  sickle cell panel which returned positive at this time.   In light of her abnormal MRI, the patient will be followed closely by Dr.  Orlin Hilding who plans a repeat MRI in about two months.  She was instructed not  to drive until her followup visit and advised not to drink more than 2 beers  per day or otherwise engage in dangerous activities.   DISCHARGE DIAGNOSES:  1. Syncope/vertigo, question etiology.  2. Abnormal MRI of right temporooccipital junction, to be followed.   DISCHARGE MEDICATIONS:  1. Atenolol 25 mg p.o. q.d.  2. HCTZ 25 mg p.o. q.d.  3. Patient's usual vitamin supplements.   DISPOSITION:  She is discharged to home with instruction to followup with  Demetrio Lapping, P. A. or Dr. Orlin Hilding in three to four weeks.  Other  restrictions as noted above.   CONDITION ON DISCHARGE:  Stable.  Prognosis fair to good.                                               Candy Sledge, M.D.     JJS/MEDQ  D:  02/28/2002  T:  03/02/2002  Job:  [id number]   cc:   [facility] Teaching Service   Santina Evans A. Orlin Hilding, M.D.  [address]  [address]  [place]  [place] [address]  Fax: [phone]

## 2010-10-26 ENCOUNTER — Other Ambulatory Visit: Payer: Self-pay | Admitting: Internal Medicine

## 2010-12-01 LAB — I-STAT 8, (EC8 V) (CONVERTED LAB)
BUN: 12 mg/dL (ref 6–23)
Glucose, Bld: 101 mg/dL — ABNORMAL HIGH (ref 70–99)
Hemoglobin: 13.9 g/dL (ref 12.0–15.0)
Potassium: 3.5 mEq/L (ref 3.5–5.1)
Sodium: 139 mEq/L (ref 135–145)
TCO2: 24 mmol/L (ref 0–100)
pH, Ven: 7.508 — ABNORMAL HIGH (ref 7.250–7.300)

## 2010-12-01 LAB — CK TOTAL AND CKMB (NOT AT ARMC)
CK, MB: 0.8 ng/mL (ref 0.3–4.0)
Relative Index: 0.6 (ref 0.0–2.5)
Total CK: 138 U/L (ref 7–177)

## 2010-12-01 LAB — RAPID URINE DRUG SCREEN, HOSP PERFORMED
Barbiturates: NOT DETECTED
Benzodiazepines: NOT DETECTED

## 2010-12-01 LAB — CBC
Platelets: 204 10*3/uL (ref 150–400)
RBC: 4.3 MIL/uL (ref 3.87–5.11)
WBC: 7.1 10*3/uL (ref 4.0–10.5)

## 2010-12-01 LAB — TROPONIN I: Troponin I: <0.01 ng/mL (ref 0.00–0.06)

## 2010-12-01 LAB — POCT I-STAT CREATININE
Creatinine, Ser: 1 mg/dL (ref 0.5–1.7)
Operator id: 222501

## 2010-12-01 LAB — POCT CARDIAC MARKERS
CKMB, poc: <1 ng/mL — ABNORMAL LOW (ref 1.0–8.0)
Operator id: 222501
Troponin i, poc: <0.05 ng/mL (ref 0.00–0.05)

## 2010-12-01 LAB — DIFFERENTIAL
Basophils Absolute: 0 10*3/uL (ref 0.0–0.1)
Basophils Relative: 1 % (ref 0–1)
Eosinophils Absolute: 0.2 10*3/uL (ref 0.0–0.7)
Monocytes Relative: 9 % (ref 3–12)
Neutro Abs: 3.8 10*3/uL (ref 1.7–7.7)
Neutrophils Relative %: 54 % (ref 43–77)

## 2010-12-01 LAB — TSH: TSH: 3.63 m[IU]/mL (ref 0.350–5.500)

## 2010-12-01 LAB — PROTIME-INR: INR: 0.9 (ref 0.00–1.49)

## 2010-12-01 LAB — D-DIMER, QUANTITATIVE: D-Dimer, Quant: <0.22 ug/mL-FEU (ref 0.00–0.48)

## 2010-12-18 ENCOUNTER — Inpatient Hospital Stay (INDEPENDENT_AMBULATORY_CARE_PROVIDER_SITE_OTHER)
Admission: RE | Admit: 2010-12-18 | Discharge: 2010-12-18 | Disposition: A | Discharge status: Home / Self Care | Payer: Self-pay | Source: Ambulatory Visit | Attending: Family Medicine | Admitting: Family Medicine

## 2010-12-18 DIAGNOSIS — N760 Acute vaginitis: Secondary | ICD-10-CM

## 2010-12-18 LAB — POCT PREGNANCY, URINE: Preg Test, Ur: NEGATIVE

## 2010-12-18 LAB — POCT URINALYSIS DIP (DEVICE)
Bilirubin Urine: NEGATIVE
Glucose, UA: NEGATIVE mg/dL
Hgb urine dipstick: NEGATIVE
Ketones, ur: NEGATIVE mg/dL
Nitrite: NEGATIVE
Protein, ur: NEGATIVE mg/dL
Specific Gravity, Urine: 1.02 (ref 1.005–1.030)
Urobilinogen, UA: 0.2 mg/dL (ref 0.0–1.0)
pH: 5.5 (ref 5.0–8.0)

## 2010-12-18 LAB — WET PREP, GENITAL
Trich, Wet Prep: NONE SEEN
Yeast Wet Prep HPF POC: NONE SEEN

## 2010-12-19 LAB — GC/CHLAMYDIA PROBE AMP, GENITAL
Chlamydia, DNA Probe: NEGATIVE
GC Probe Amp, Genital: NEGATIVE

## 2011-01-20 ENCOUNTER — Other Ambulatory Visit (HOSPITAL_COMMUNITY): Payer: Self-pay | Admitting: Internal Medicine

## 2011-01-20 DIAGNOSIS — Z1231 Encounter for screening mammogram for malignant neoplasm of breast: Secondary | ICD-10-CM

## 2011-02-26 ENCOUNTER — Ambulatory Visit (HOSPITAL_COMMUNITY): Payer: Self-pay | Attending: Internal Medicine

## 2011-03-26 ENCOUNTER — Encounter (HOSPITAL_COMMUNITY): Payer: Self-pay | Admitting: Emergency Medicine

## 2011-03-26 ENCOUNTER — Emergency Department (HOSPITAL_COMMUNITY)
Admission: EM | Admit: 2011-03-26 | Discharge: 2011-03-26 | Disposition: A | Discharge status: Still In Hospital | Payer: Self-pay | Source: Home / Self Care | Attending: Emergency Medicine | Admitting: Emergency Medicine

## 2011-03-26 ENCOUNTER — Emergency Department (INDEPENDENT_AMBULATORY_CARE_PROVIDER_SITE_OTHER)
Admission: EM | Admit: 2011-03-26 | Discharge: 2011-03-26 | Disposition: A | Discharge status: Home / Self Care | Payer: Self-pay | Source: Home / Self Care | Attending: Emergency Medicine | Admitting: Emergency Medicine

## 2011-03-26 DIAGNOSIS — M62830 Muscle spasm of back: Secondary | ICD-10-CM

## 2011-03-26 DIAGNOSIS — M5380 Other specified dorsopathies, site unspecified: Secondary | ICD-10-CM

## 2011-03-26 HISTORY — DX: Dorsalgia, unspecified: M54.9

## 2011-03-26 HISTORY — DX: Unspecified convulsions: R56.9

## 2011-03-26 HISTORY — DX: Essential (primary) hypertension: I10

## 2011-03-26 HISTORY — DX: Unspecified atrial fibrillation: I48.91

## 2011-03-26 MED ORDER — DIAZEPAM 5 MG PO TABS: 5.0000 mg | ORAL_TABLET | Freq: Four times a day (QID) | ORAL | Status: AC | PRN

## 2011-03-26 MED ORDER — HYDROCODONE-ACETAMINOPHEN 5-325 MG PO TABS: 2.0000 | ORAL_TABLET | ORAL | Status: AC | PRN

## 2011-03-26 MED ORDER — IBUPROFEN 600 MG PO TABS: 600.0000 mg | ORAL_TABLET | Freq: Four times a day (QID) | ORAL | Status: AC | PRN

## 2011-03-26 MED ORDER — LORAZEPAM 2 MG/ML IJ SOLN
INTRAMUSCULAR | Status: AC
  Filled 2011-03-26: qty 1

## 2011-03-26 MED ORDER — LORAZEPAM 2 MG/ML IJ SOLN
1.0000 mg | Freq: Once | INTRAMUSCULAR | Status: AC
  Administered 2011-03-26: 1 mg via INTRAMUSCULAR

## 2011-03-26 NOTE — ED Notes (Signed)
ALL INFORMATION DELETED ON THIS PT DUE TO DIFFERENT BIRTHDATE AND NEW CHART CREATED.= WITH DIFFERENT MEDICAL RECORD NUMBER

## 2011-03-26 NOTE — ED Provider Notes (Signed)
History     CSN: [id number]  Arrival date & time 03/26/11  1031   None     Chief Complaint  Patient presents with  . Spasms  . Back Pain    (Consider location/radiation/quality/duration/timing/severity/associated sxs/prior treatment) HPI Comments: Patient with left sided mid thoracic and left lumbar pain described as "spasms" over the past 2 days patient states she feels a "catch" when she rotates her torso, walks, bends forward. States that the muscles in between her ribs and down the left side of her back then start spasming. States that the pain is now starting to go into her right lower back . Reports some shortness of breath during spasms. No other shortness of breath. No nausea, vomiting, diaphoresis, radiation to neck or to arm, palpitations. No wheezing, cough, fevers. No exertional component. Has tried leftover prescription of Flexeril, ibuprofen 400 mg without relief. Has had multiple episodes of identical symptoms in the past since injuring her back at work years ago.  ROS as noted in HPI. All other ROS negative.   Patient is a 51 y.o. female presenting with back pain.  Back Pain  This is a recurrent problem. The current episode started 2 days ago. The problem has been gradually worsening. The pain is associated with twisting. The pain is present in the thoracic spine and lumbar spine. The pain does not radiate. The symptoms are aggravated by bending, twisting and certain positions. The pain is worse during the day. Pertinent negatives include no chest pain, no fever, no numbness, no weight loss, no headaches, no abdominal pain, no abdominal swelling, no bowel incontinence, no perianal numbness, no bladder incontinence, no dysuria, no pelvic pain, no leg pain, no paresthesias, no paresis, no tingling and no weakness. She has tried NSAIDs and muscle relaxants for the symptoms. The treatment provided no relief.    Past Medical History  Diagnosis Date  . Atrial fibrillation   .  Hypertension   . Back pain   . Seizures     Past Surgical History  Procedure Date  . Cesarean section     History reviewed. No pertinent family history.  History  Substance Use Topics  . Smoking status: Current Everyday Smoker  . Smokeless tobacco: Not on file  . Alcohol Use: Yes    OB History    Grav Para Term Preterm Abortions TAB SAB Ect Mult Living                  Review of Systems  Constitutional: Negative for fever and weight loss.  Cardiovascular: Negative for chest pain.  Gastrointestinal: Negative for abdominal pain and bowel incontinence.  Genitourinary: Negative for bladder incontinence, dysuria and pelvic pain.  Musculoskeletal: Positive for back pain.  Neurological: Negative for tingling, weakness, numbness, headaches and paresthesias.    Allergies  Penicillins  Home Medications   Current Outpatient Rx  Name Route Sig Dispense Refill  . CARVEDILOL 12.5 MG PO TABS Oral Take 12.5 mg by mouth 2 (two) times daily with a meal.    . LEVETIRACETAM 500 MG PO TABS Oral Take 500 mg by mouth every 12 (twelve) hours.    Marland Kitchen VALSARTAN-HYDROCHLOROTHIAZIDE 160-25 MG PO TABS Oral Take 1 tablet by mouth daily.    Marland Kitchen AMLODIPINE BESYLATE 5 MG PO TABS  TAKE ONE TABLET BY MOUTH EVERY DAY 30 tablet 6  . DIAZEPAM 5 MG PO TABS Oral Take 1 tablet (5 mg total) by mouth every 6 (six) hours as needed for anxiety. 16 tablet 0  .  HYDROCODONE-ACETAMINOPHEN 5-325 MG PO TABS Oral Take 2 tablets by mouth every 4 (four) hours as needed for pain. 20 tablet 0  . IBUPROFEN 600 MG PO TABS Oral Take 1 tablet (600 mg total) by mouth every 6 (six) hours as needed for pain. 30 tablet 0    BP 125/85  Pulse 70  Temp(Src) 98.2 F (36.8 C) (Oral)  Resp 16  SpO2 100%  Physical Exam  Nursing note and vitals reviewed. Constitutional: She is oriented to person, place, and time. She appears well-developed and well-nourished. She appears distressed.       Appears in mod painful distress  HENT:    Head: Normocephalic and atraumatic.  Eyes: Conjunctivae and EOM are normal.  Neck: Normal range of motion.  Cardiovascular: Normal rate, regular rhythm, normal heart sounds and intact distal pulses.   Pulmonary/Chest: Effort normal and breath sounds normal.  Abdominal: Soft. Bowel sounds are normal. She exhibits no distension. There is no tenderness.  Musculoskeletal: Normal range of motion.       Back:  Neurological: She is alert and oriented to person, place, and time.  Skin: Skin is warm and dry.  Psychiatric: She has a normal mood and affect. Her behavior is normal. Judgment and thought content normal.    ED Course  Procedures (including critical care time)  Labs Reviewed - No data to display No results found.   1. Back muscle spasm       MDM  [place] narcotic database reviewed. Pt with no narcotic rx in database. Previous charts reviewed.  Patient last seen urgent care Center on 12/2009 with a similar presentation. Had back pain left lower site and to the hip. Was prescribed Flexeril and Vicoprofen.   Pt started having muscle spasms with deep inspiration. Appears uncomfortable. VS acceptable. Will give her ativan 1 mg IM. Cardiac etiology less likely based on H&P. Discussed this with the patient. Will send her home with Valium, NSAIDs, and and a small prescription for Norco. Patient to follow with her doctor as needed. Patient agrees with plan.  Luiz Blare, MD 03/26/11 1120

## 2011-03-26 NOTE — ED Notes (Signed)
PT HERE WITH PROGRESSIVE MUSCLE SPASMS THAT STARTED UNDER LEFT SHOULDER BLADE WITH RADIATION TO RIGHT SIDE AND DOWN TO LUMBAR REGION X 2 DYS.PT HAS HX BACK INJURY YRS AGO AND STATES SX FLARES UP OCASSIONALLY.PT HAS BEEN TAKING FLEXERIL,IBUPROFEN AND HYDROCODONE AND STATES ITS NOT TOUCHING PAIN.PT NEEDED W/C TO GET IN ROOM

## 2011-03-26 NOTE — ED Notes (Deleted)
PT HERE WITH PROGRESSIVE SPASMS STARTING UNDER L SHOULDER BLADE RADIATING ACROSS R CHEST AND DOWN LUMBAR AREA X 2 DYS AGO.PT HAS HX BACK INJURY YRS AGO AND STATES PAIN FLARES UP OCASS.DIFF TO BREATHE OR AMBULATE

## 2011-05-15 ENCOUNTER — Other Ambulatory Visit (HOSPITAL_COMMUNITY): Payer: Self-pay | Admitting: Internal Medicine

## 2011-05-17 ENCOUNTER — Other Ambulatory Visit: Payer: Self-pay

## 2011-05-17 MED ORDER — CARVEDILOL 12.5 MG PO TABS: 12.5000 mg | ORAL_TABLET | Freq: Two times a day (BID) | ORAL | Status: DC

## 2011-06-06 ENCOUNTER — Other Ambulatory Visit (HOSPITAL_COMMUNITY): Payer: Self-pay | Admitting: Internal Medicine

## 2011-10-31 ENCOUNTER — Other Ambulatory Visit (HOSPITAL_COMMUNITY): Payer: Self-pay | Admitting: Internal Medicine

## 2011-10-31 DIAGNOSIS — I10 Essential (primary) hypertension: Secondary | ICD-10-CM

## 2011-12-06 ENCOUNTER — Other Ambulatory Visit (HOSPITAL_COMMUNITY): Payer: Self-pay | Admitting: *Deleted

## 2011-12-06 DIAGNOSIS — I10 Essential (primary) hypertension: Secondary | ICD-10-CM

## 2011-12-06 MED ORDER — AMLODIPINE BESYLATE 5 MG PO TABS: ORAL_TABLET | ORAL | Status: DC

## 2011-12-08 ENCOUNTER — Other Ambulatory Visit: Payer: Self-pay

## 2011-12-08 DIAGNOSIS — I10 Essential (primary) hypertension: Secondary | ICD-10-CM

## 2011-12-08 MED ORDER — AMLODIPINE BESYLATE 5 MG PO TABS: 5.0000 mg | ORAL_TABLET | Freq: Every day | ORAL | Status: DC

## 2011-12-09 ENCOUNTER — Other Ambulatory Visit: Payer: Self-pay | Admitting: Internal Medicine

## 2011-12-09 DIAGNOSIS — I10 Essential (primary) hypertension: Secondary | ICD-10-CM

## 2011-12-09 MED ORDER — AMLODIPINE BESYLATE 5 MG PO TABS: 5.0000 mg | ORAL_TABLET | Freq: Every day | ORAL | Status: DC

## 2012-01-17 ENCOUNTER — Encounter: Payer: Self-pay | Admitting: Cardiovascular Disease

## 2012-01-18 ENCOUNTER — Ambulatory Visit (INDEPENDENT_AMBULATORY_CARE_PROVIDER_SITE_OTHER): Payer: Self-pay | Admitting: Cardiovascular Disease

## 2012-01-18 ENCOUNTER — Encounter: Payer: Self-pay | Admitting: Cardiovascular Disease

## 2012-01-18 VITALS — BP 114/86 | HR 79 | Ht 64.5 in | Wt 157.6 lb

## 2012-01-18 DIAGNOSIS — I10 Essential (primary) hypertension: Secondary | ICD-10-CM

## 2012-01-18 DIAGNOSIS — I4891 Unspecified atrial fibrillation: Secondary | ICD-10-CM

## 2012-01-18 NOTE — Patient Instructions (Addendum)
Your physician wants you to follow-up in: 1 YEAR with Dr Cooper.  You will receive a reminder letter in the mail two months in advance. If you don't receive a letter, please call our office to schedule the follow-up appointment.  Your physician recommends that you continue on your current medications as directed. Please refer to the Current Medication list given to you today.  

## 2012-01-18 NOTE — Progress Notes (Signed)
HPI:  51 year old woman presenting for followup evaluation. She has been previously followed by Dr. Gala Romney. The patient has paroxysmal atrial fibrillation with no episodes of atrial fib in several years. She also has hypertension and has been well controlled on medication.  She presents today for followup evaluation because she needs to get her medications refilled. She denies chest pain, chest pressure, palpitations, dyspnea, lightheadedness, syncope, or leg swelling. She is doing quite well and has no complaints today.  Outpatient Encounter Prescriptions as of 01/18/2012  Medication Sig Dispense Refill  . ALPRAZolam (XANAX) 0.5 MG tablet Take 0.5 mg by mouth at bedtime as needed.      Marland Kitchen amLODipine (NORVASC) 5 MG tablet Take 1 tablet (5 mg total) by mouth daily.  30 tablet  1  . aspirin 325 MG tablet Take 325 mg by mouth daily.      . carvedilol (COREG) 12.5 MG tablet Take 1 tablet (12.5 mg total) by mouth 2 (two) times daily with a meal.  60 tablet  5  . levETIRAcetam (KEPPRA) 500 MG tablet Take 500 mg by mouth every 12 (twelve) hours.      Marland Kitchen losartan-hydrochlorothiazide (HYZAAR) 100-25 MG per tablet Take 1 tablet by mouth daily.      . [DISCONTINUED] valsartan-hydrochlorothiazide (DIOVAN-HCT) 160-25 MG per tablet Take 1 tablet by mouth daily.        Penicillins  Past Medical History  Diagnosis Date  . Atrial fibrillation   . Hypertension   . Back pain   . Seizures     Past Surgical History  Procedure Date  . Cesarean section     History   Social History  . Marital Status: Single    Spouse Name: N/A    Number of Children: N/A  . Years of Education: N/A   Occupational History  . Not on file.   Social History Main Topics  . Smoking status: Current Every Day Smoker  . Smokeless tobacco: Not on file  . Alcohol Use: Yes  . Drug Use: No  . Sexually Active:    Other Topics Concern  . Not on file   Social History Narrative  . No narrative on file    No family  history on file.  ROS: General: no fevers/chills/night sweats Eyes: no blurry vision, diplopia, or amaurosis ENT: no sore throat or hearing loss Resp: no cough, wheezing, or hemoptysis CV: no edema or palpitations GI: no abdominal pain, nausea, vomiting, diarrhea, or constipation GU: no dysuria, frequency, or hematuria Skin: no rash Neuro: no headache, numbness, tingling, or weakness of extremities Musculoskeletal: no joint pain or swelling Heme: no bleeding, DVT, or easy bruising Endo: no polydipsia or polyuria  BP 114/86  Pulse 79  Ht 5' 4.5" (1.638 m)  Wt 71.487 kg (157 lb 9.6 oz)  BMI 26.63 kg/m2  PHYSICAL EXAM: Pt is alert and oriented, WD, WN, in no distress. HEENT: normal Neck: JVP normal. Carotid upstrokes normal without bruits. No thyromegaly. Lungs: equal expansion, clear bilaterally CV: Apex is discrete and nondisplaced, RRR without murmur or gallop Abd: soft, NT, +BS, no bruit, no hepatosplenomegaly Back: no CVA tenderness Ext: no C/C/E        Femoral pulses 2+= without bruits        DP/PT pulses intact and = Skin: warm and dry without rash Neuro: CNII-XII intact             Strength intact = bilaterally  EKG:  Normal sinus rhythm 79 beats per minute, within  normal limits.  ASSESSMENT AND PLAN: 1. Paroxysmal atrial fibrillation. It does not appear that she has had any symptomatic atrial fib in several years. She has been treated with aspirin because of low CHADS score = 1 (HTN). I would agree with continuing this. It is also important to note that she has a seizure disorder and I think anticoagulation is at least relatively contraindicated because of this.  She has a structurally normal heart by echocardiogram in 2009 this was reviewed today. I would like to see her back in 12 months for followup.  2. Hypertension, essential. Her blood pressure is well controlled on her current antihypertensive therapy. These will be continued without changes.  For followup I  would like to see the patient back in 12 months.  Tonny Bollman 01/18/2012 6:15 PM

## 2012-03-17 ENCOUNTER — Other Ambulatory Visit: Payer: Self-pay

## 2012-03-17 MED ORDER — CARVEDILOL 12.5 MG PO TABS: 12.5000 mg | ORAL_TABLET | Freq: Two times a day (BID) | ORAL | Status: DC

## 2012-03-23 ENCOUNTER — Other Ambulatory Visit (HOSPITAL_COMMUNITY): Payer: Self-pay | Admitting: *Deleted

## 2012-03-23 MED ORDER — CARVEDILOL 12.5 MG PO TABS: 12.5000 mg | ORAL_TABLET | Freq: Two times a day (BID) | ORAL | Status: DC

## 2012-03-24 ENCOUNTER — Telehealth: Payer: Self-pay | Admitting: Cardiovascular Disease

## 2012-03-24 ENCOUNTER — Telehealth: Payer: Self-pay | Admitting: *Deleted

## 2012-03-24 NOTE — Telephone Encounter (Signed)
Error

## 2012-03-24 NOTE — Telephone Encounter (Signed)
New problem:    [facility] on [address]   Coreg 12.5 mg.

## 2012-03-28 ENCOUNTER — Other Ambulatory Visit: Payer: Self-pay

## 2012-03-28 MED ORDER — CARVEDILOL 12.5 MG PO TABS: 12.5000 mg | ORAL_TABLET | Freq: Two times a day (BID) | ORAL | Status: DC

## 2012-03-28 NOTE — Telephone Encounter (Signed)
Refill sent for carvedilol 12.5 mg take one tablet twice a day.  

## 2012-04-05 ENCOUNTER — Other Ambulatory Visit: Payer: Self-pay | Admitting: *Deleted

## 2012-04-05 DIAGNOSIS — I10 Essential (primary) hypertension: Secondary | ICD-10-CM

## 2012-04-05 MED ORDER — AMLODIPINE BESYLATE 5 MG PO TABS: 5.0000 mg | ORAL_TABLET | Freq: Every day | ORAL | Status: DC

## 2012-04-06 ENCOUNTER — Other Ambulatory Visit: Payer: Self-pay | Admitting: Internal Medicine

## 2012-04-07 ENCOUNTER — Telehealth: Payer: Self-pay

## 2012-04-07 NOTE — Telephone Encounter (Signed)
Called [name] to ask her about refill request for Carvedilol 12.5 MG and she stated she already picked it up and that she does not need any refills for any of her medications at this time.

## 2012-08-09 ENCOUNTER — Other Ambulatory Visit: Payer: Self-pay | Admitting: Cardiovascular Disease

## 2012-10-17 ENCOUNTER — Other Ambulatory Visit: Payer: Self-pay

## 2012-10-17 MED ORDER — LOSARTAN POTASSIUM-HCTZ 100-25 MG PO TABS: 1.0000 | ORAL_TABLET | Freq: Every day | ORAL | Status: DC

## 2012-12-06 LAB — HM COLONOSCOPY

## 2012-12-09 ENCOUNTER — Other Ambulatory Visit: Payer: Self-pay | Admitting: Cardiovascular Disease

## 2012-12-12 ENCOUNTER — Emergency Department (INDEPENDENT_AMBULATORY_CARE_PROVIDER_SITE_OTHER): Payer: Medicaid Other

## 2012-12-12 ENCOUNTER — Encounter (HOSPITAL_COMMUNITY): Payer: Self-pay | Admitting: Emergency Medicine

## 2012-12-12 ENCOUNTER — Emergency Department (HOSPITAL_COMMUNITY)
Admission: EM | Admit: 2012-12-12 | Discharge: 2012-12-12 | Disposition: A | Discharge status: Home / Self Care | Payer: Medicaid Other | Source: Home / Self Care | Attending: Family Medicine | Admitting: Family Medicine

## 2012-12-12 DIAGNOSIS — M25462 Effusion, left knee: Secondary | ICD-10-CM

## 2012-12-12 DIAGNOSIS — M25511 Pain in right shoulder: Secondary | ICD-10-CM

## 2012-12-12 DIAGNOSIS — M25519 Pain in unspecified shoulder: Secondary | ICD-10-CM

## 2012-12-12 DIAGNOSIS — M25469 Effusion, unspecified knee: Secondary | ICD-10-CM

## 2012-12-12 MED ORDER — HYDROCODONE-ACETAMINOPHEN 5-325 MG PO TABS: 2.0000 | ORAL_TABLET | ORAL | Status: DC | PRN

## 2012-12-12 MED ORDER — IBUPROFEN 800 MG PO TABS: 800.0000 mg | ORAL_TABLET | Freq: Three times a day (TID) | ORAL | Status: DC

## 2012-12-12 NOTE — ED Provider Notes (Signed)
CSN: [id number]     Arrival date & time 12/12/12  4098 History   First MD Initiated Contact with Patient 12/12/12 [phone]     Chief Complaint  Patient presents with  . Knee Pain  . Shoulder Pain   (Consider location/radiation/quality/duration/timing/severity/associated sxs/prior Treatment) Patient is a 52 y.o. female presenting with knee pain and shoulder pain. The history is provided by the patient. No language interpreter was used.  Knee Pain Location:  Knee Injury: no   Knee location:  L knee Pain details:    Quality:  Aching and sharp   Radiates to:  Does not radiate   Severity:  Moderate   Onset quality:  Gradual   Timing:  Constant   Progression:  Worsening Chronicity:  New Prior injury to area:  Yes Relieved by:  Nothing Worsened by:  Nothing tried Ineffective treatments:  None tried Associated symptoms comment:  Right shoulder pain Shoulder Pain  Pt complains of pain with moving shoulder  Past Medical History  Diagnosis Date  . Atrial fibrillation   . Hypertension   . Back pain   . Seizures    Past Surgical History  Procedure Laterality Date  . Cesarean section     History reviewed. No pertinent family history. History  Substance Use Topics  . Smoking status: Current Every Day Smoker  . Smokeless tobacco: Not on file  . Alcohol Use: Yes   OB History   Grav Para Term Preterm Abortions TAB SAB Ect Mult Living                 Review of Systems  Musculoskeletal: Positive for joint swelling.  All other systems reviewed and are negative.    Allergies  Penicillins  Home Medications   Current Outpatient Rx  Name  Route  Sig  Dispense  Refill  . ALPRAZolam (XANAX) 0.5 MG tablet   Oral   Take 0.5 mg by mouth at bedtime as needed.         Marland Kitchen amLODipine (NORVASC) 5 MG tablet      TAKE ONE TABLET BY MOUTH ONCE DAILY   30 tablet   0   . aspirin 325 MG tablet   Oral   Take 325 mg by mouth daily.         . carvedilol (COREG) 12.5 MG tablet  Oral   Take 1 tablet (12.5 mg total) by mouth 2 (two) times daily with a meal.   60 tablet   4   . levETIRAcetam (KEPPRA) 500 MG tablet   Oral   Take 500 mg by mouth every 12 (twelve) hours.         Marland Kitchen losartan-hydrochlorothiazide (HYZAAR) 100-25 MG per tablet   Oral   Take 1 tablet by mouth daily.   30 tablet   4    BP 140/85  Pulse 80  Temp(Src) 98.5 F (36.9 C) (Oral)  SpO2 100% Physical Exam  Vitals reviewed. Constitutional: She is oriented to person, place, and time. She appears well-developed and well-nourished.  HENT:  Head: Normocephalic.  Eyes: Pupils are equal, round, and reactive to light.  Cardiovascular: Normal rate.   Pulmonary/Chest: Effort normal.  Musculoskeletal: She exhibits tenderness.  Diffusely tender left shoulder,  No swollen,  Right knee effusion,  Tender,     Neurological: She is alert and oriented to person, place, and time. She has normal reflexes.  Skin: Skin is warm.  Psychiatric: She has a normal mood and affect.    ED Course  Procedures (  including critical care time) Labs Review Labs Reviewed - No data to display Imaging Review Dg Shoulder Right  12/12/2012   CLINICAL DATA:  Right shoulder pain for 2 months  EXAM: RIGHT SHOULDER - 2+ VIEW  COMPARISON:  None.  FINDINGS: There is no evidence of fracture or dislocation. Minimal degenerative changes right AC joint. Glenohumeral joint is preserved.  IMPRESSION: No acute fracture or subluxation. Minimal degenerative changes right AC joint.   Electronically Signed   By: Natasha Mead M.D.   On: 12/12/2012 09:43   Dg Knee Complete 4 Views Left  12/12/2012   CLINICAL DATA:  Knee pain  EXAM: LEFT KNEE - COMPLETE 4+ VIEW  COMPARISON:  None.  FINDINGS: The knee is located and the bony mineralization appears normal. There is a moderate-sized joint effusion. Degenerative changes of the patella noted with osteophyte formation along the superior pole and somewhat patchy mineralization of the posterior  cortex. No significant joint space narrowing of the knee is appreciated. Tiny patellar osteophytes are seen in the medial compartment. Negative for fracture.  IMPRESSION: 1.  Moderate-sized joint effusion.  2. Mild degenerative changes of the patellofemoral and medial compartments.   Electronically Signed   By: Britta Mccreedy M.D.   On: 12/12/2012 09:42    MDM   1. Effusion of left knee   2. Right shoulder pain    Knee immbolizer,  Hydrocodone, ibuprofen.   Schedule to see Dr. Rennis Chris for evaluation.   Rx for prednisone taper,  hydrocodone    Elson Areas, PA-C 12/12/12 1003

## 2012-12-12 NOTE — ED Notes (Signed)
C/o left knee pain and right shoulder pain for months off and on.  Patient states she feels as if fluid is on her knee.  Ice, heat, and pain medication taken.

## 2012-12-13 NOTE — ED Provider Notes (Signed)
Medical screening examination/treatment/procedure(s) were performed by resident physician or non-physician practitioner and as supervising physician I was immediately available for consultation/collaboration.   KINDL,JAMES DOUGLAS MD.   James D Kindl, MD 12/13/12 2109 

## 2012-12-21 ENCOUNTER — Other Ambulatory Visit: Payer: Self-pay

## 2012-12-21 MED ORDER — LEVETIRACETAM 500 MG PO TABS: 500.0000 mg | ORAL_TABLET | Freq: Two times a day (BID) | ORAL | Status: DC

## 2013-01-12 ENCOUNTER — Other Ambulatory Visit: Payer: Self-pay | Admitting: Cardiovascular Disease

## 2013-01-17 ENCOUNTER — Ambulatory Visit (INDEPENDENT_AMBULATORY_CARE_PROVIDER_SITE_OTHER): Payer: Medicaid Other | Admitting: Cardiovascular Disease

## 2013-01-17 ENCOUNTER — Encounter: Payer: Self-pay | Admitting: Cardiovascular Disease

## 2013-01-17 VITALS — BP 124/72 | HR 71 | Ht 64.5 in | Wt 159.0 lb

## 2013-01-17 DIAGNOSIS — I10 Essential (primary) hypertension: Secondary | ICD-10-CM

## 2013-01-17 NOTE — Progress Notes (Signed)
    HPI:  52 year old woman presenting for followup evaluation. The patient is followed for paroxysmal atrial fibrillation with no symptomatic episodes in several years. She's been maintained on aspirin alone with a CHADS-Vasc of 2 (female, HTN) and no atrial fib x several years. She also has a seizure disorder but no seizures in the past few years.  From a cardiac perspective she is doing well. She had a fleeting episode of palpitations several months ago. She's had no sustained symptoms. She denies chest pain, lightheadedness, edema, or shortness of breath.  The patient continues to smoke one pack per day. She has quit a few times in the past but has not been able to stay off of cigarettes long-term.  Outpatient Encounter Prescriptions as of 01/17/2013  Medication Sig  . ALPRAZolam (XANAX) 0.5 MG tablet Take 0.5 mg by mouth at bedtime as needed.  Marland Kitchen amLODipine (NORVASC) 5 MG tablet TAKE ONE TABLET BY MOUTH ONCE DAILY  . aspirin 325 MG tablet Take 325 mg by mouth daily.  . carvedilol (COREG) 12.5 MG tablet Take 1 tablet (12.5 mg total) by mouth 2 (two) times daily with a meal.  . HYDROcodone-acetaminophen (NORCO/VICODIN) 5-325 MG per tablet Take 2 tablets by mouth every 4 (four) hours as needed for pain.  Marland Kitchen ibuprofen (ADVIL,MOTRIN) 800 MG tablet Take 1 tablet (800 mg total) by mouth 3 (three) times daily.  Marland Kitchen levETIRAcetam (KEPPRA) 500 MG tablet Take 1 tablet (500 mg total) by mouth 2 (two) times daily.  Marland Kitchen losartan-hydrochlorothiazide (HYZAAR) 100-25 MG per tablet Take 1 tablet by mouth daily.    Allergies  Allergen Reactions  . Penicillins     Past Medical History  Diagnosis Date  . Atrial fibrillation   . Hypertension   . Back pain   . Seizures    ROS: Positive for anxiety, otherwise negative except as per HPI  BP 124/72  Pulse 71  Ht 5' 4.5" (1.638 m)  Wt 159 lb (72.122 kg)  BMI 26.88 kg/m2  PHYSICAL EXAM: Pt is alert and oriented, NAD HEENT: normal Neck: JVP - normal,  carotids 2+= without bruits Lungs: CTA bilaterally CV: RRR without murmur or gallop Abd: soft, NT, Positive BS, no hepatomegaly Ext: no C/C/E, distal pulses intact and equal Skin: warm/dry no rash  EKG:  Normal sinus rhythm 71 beats per minute, early repolarization pattern.  ASSESSMENT AND PLAN: 1. Paroxysmal atrial fibrillation. No documented episodes of atrial fib and many years. Will continue current program which includes aspirin alone. Her heart is structurally normal and considering her seizure disorder I think the risk-benefit of anticoagulation is unfavorable.  2. Essential hypertension. Blood pressure well controlled on a combination of amlodipine, carvedilol, losartan, and hydrochlorothiazide.  For followup I will see her back in 12 months. Labs are checked regularly by Dr. Renae Gloss.  Tonny Bollman 01/17/2013 12:13 PM

## 2013-01-17 NOTE — Patient Instructions (Signed)
Your physician wants you to follow-up in one YEAR with Dr Excell Seltzer. You will receive a reminder letter in the mail two months in advance. If you don't receive a letter, please call our office to schedule the follow-up appointment.  Your physician recommends that you continue on your current medications as directed. Please refer to the Current Medication list given to you today.

## 2013-01-18 ENCOUNTER — Telehealth: Payer: Self-pay | Admitting: *Deleted

## 2013-01-18 NOTE — Telephone Encounter (Signed)
Medicaid approval for losartan-hctz PA # [id number]

## 2013-01-21 ENCOUNTER — Encounter (HOSPITAL_COMMUNITY): Payer: Self-pay | Admitting: Emergency Medicine

## 2013-01-21 ENCOUNTER — Emergency Department (HOSPITAL_COMMUNITY): Admission: EM | Admit: 2013-01-21 | Discharge: 2013-01-21 | Discharge status: Against Medical Advice | Payer: Self-pay

## 2013-01-21 ENCOUNTER — Emergency Department (HOSPITAL_COMMUNITY)
Admission: EM | Admit: 2013-01-21 | Discharge: 2013-01-21 | Disposition: A | Discharge status: Home / Self Care | Payer: Medicaid Other | Attending: Emergency Medicine | Admitting: Emergency Medicine

## 2013-01-21 ENCOUNTER — Emergency Department (HOSPITAL_COMMUNITY)
Admission: EM | Admit: 2013-01-21 | Discharge: 2013-01-21 | Discharge status: Against Medical Advice | Payer: Medicaid Other

## 2013-01-21 DIAGNOSIS — I10 Essential (primary) hypertension: Secondary | ICD-10-CM | POA: Insufficient documentation

## 2013-01-21 DIAGNOSIS — M549 Dorsalgia, unspecified: Secondary | ICD-10-CM

## 2013-01-21 DIAGNOSIS — I4891 Unspecified atrial fibrillation: Secondary | ICD-10-CM | POA: Insufficient documentation

## 2013-01-21 DIAGNOSIS — Z880 Allergy status to penicillin: Secondary | ICD-10-CM | POA: Insufficient documentation

## 2013-01-21 DIAGNOSIS — Z791 Long term (current) use of non-steroidal anti-inflammatories (NSAID): Secondary | ICD-10-CM | POA: Insufficient documentation

## 2013-01-21 DIAGNOSIS — M545 Low back pain, unspecified: Secondary | ICD-10-CM | POA: Insufficient documentation

## 2013-01-21 DIAGNOSIS — Z7982 Long term (current) use of aspirin: Secondary | ICD-10-CM | POA: Insufficient documentation

## 2013-01-21 DIAGNOSIS — F17200 Nicotine dependence, unspecified, uncomplicated: Secondary | ICD-10-CM | POA: Insufficient documentation

## 2013-01-21 DIAGNOSIS — G40909 Epilepsy, unspecified, not intractable, without status epilepticus: Secondary | ICD-10-CM | POA: Insufficient documentation

## 2013-01-21 DIAGNOSIS — Z79899 Other long term (current) drug therapy: Secondary | ICD-10-CM | POA: Insufficient documentation

## 2013-01-21 MED ORDER — DIAZEPAM 5 MG/ML IJ SOLN
5.0000 mg | Freq: Once | INTRAMUSCULAR | Status: AC
  Administered 2013-01-21: 5 mg via INTRAVENOUS
  Filled 2013-01-21: qty 2

## 2013-01-21 MED ORDER — HYDROMORPHONE HCL PF 1 MG/ML IJ SOLN
1.0000 mg | Freq: Once | INTRAMUSCULAR | Status: AC
  Administered 2013-01-21: 1 mg via INTRAVENOUS
  Filled 2013-01-21: qty 1

## 2013-01-21 MED ORDER — DIAZEPAM 5 MG PO TABS: 5.0000 mg | ORAL_TABLET | Freq: Three times a day (TID) | ORAL | Status: DC | PRN

## 2013-01-21 MED ORDER — DEXAMETHASONE SODIUM PHOSPHATE 4 MG/ML IJ SOLN
8.0000 mg | Freq: Once | INTRAMUSCULAR | Status: AC
  Administered 2013-01-21: 8 mg via INTRAVENOUS
  Filled 2013-01-21: qty 2

## 2013-01-21 MED ORDER — NAPROXEN 375 MG PO TABS: 375.0000 mg | ORAL_TABLET | Freq: Two times a day (BID) | ORAL | Status: DC | PRN

## 2013-01-21 MED ORDER — OXYCODONE-ACETAMINOPHEN 5-325 MG PO TABS: 1.0000 | ORAL_TABLET | ORAL | Status: DC | PRN

## 2013-01-21 MED ORDER — KETOROLAC TROMETHAMINE 30 MG/ML IJ SOLN
30.0000 mg | Freq: Once | INTRAMUSCULAR | Status: AC
  Administered 2013-01-21: 30 mg via INTRAVENOUS
  Filled 2013-01-21: qty 1

## 2013-01-21 NOTE — ED Notes (Signed)
Pt c/o lower back pain x 2 days

## 2013-01-21 NOTE — ED Notes (Signed)
Discharge instructions explained to pt and a copy given to pt along w/ her prescriptions; pt verbalized understanding instructions.

## 2013-01-23 ENCOUNTER — Other Ambulatory Visit: Payer: Self-pay | Admitting: Diagnostic Neuroimaging

## 2013-01-25 NOTE — ED Provider Notes (Signed)
CSN: [id number]     Arrival date & time 01/21/13  1156 History   First MD Initiated Contact with Patient 01/21/13 1215     Chief Complaint  Patient presents with  . Back Pain   (Consider location/radiation/quality/duration/timing/severity/associated sxs/prior Treatment) HPI  52 year old female with back pain. Atraumatic. Onset about 2 days ago and progressively worsening. Vision she felt a "twinge" in her lower back when she twisted. Became progressively worse over the period of several hours significantly worse the next day. Since then the pain has been relatively constant. Takes at rest and worse with any type of movement. No numbness, tingling loss of strength. No urinary complaints. No incontinence or retention. Is on aspirin, otherwise no blood thinners. No history similar type pain. No history of spinal surgery. Denies IV drug use. No history of malignancy.  Past Medical History  Diagnosis Date  . Atrial fibrillation   . Hypertension   . Back pain   . Seizures    Past Surgical History  Procedure Laterality Date  . Cesarean section     No family history on file. History  Substance Use Topics  . Smoking status: Current Every Day Smoker    Types: Cigarettes  . Smokeless tobacco: Not on file  . Alcohol Use: Yes   OB History   Grav Para Term Preterm Abortions TAB SAB Ect Mult Living                 Review of Systems  All systems reviewed and negative, other than as noted in HPI.   Allergies  Penicillins  Home Medications   Current Outpatient Rx  Name  Route  Sig  Dispense  Refill  . ALPRAZolam (XANAX) 0.5 MG tablet   Oral   Take 0.5 mg by mouth at bedtime as needed.         Marland Kitchen amLODipine (NORVASC) 5 MG tablet   Oral   Take 5 mg by mouth daily.         Marland Kitchen aspirin 325 MG tablet   Oral   Take 325 mg by mouth daily.         . carvedilol (COREG) 12.5 MG tablet   Oral   Take 1 tablet (12.5 mg total) by mouth 2 (two) times daily with a meal.   60 tablet    4   . ibuprofen (ADVIL,MOTRIN) 800 MG tablet   Oral   Take 1 tablet (800 mg total) by mouth 3 (three) times daily.   21 tablet   0   . levETIRAcetam (KEPPRA) 500 MG tablet   Oral   Take 1 tablet (500 mg total) by mouth 2 (two) times daily.   60 tablet   0     PLEASE SCHEDULE APPT   . losartan-hydrochlorothiazide (HYZAAR) 100-25 MG per tablet   Oral   Take 1 tablet by mouth daily.   30 tablet   4   . Multiple Vitamins-Minerals (MULTIVITAMIN WITH MINERALS) tablet   Oral   Take 1 tablet by mouth daily.         . diazepam (VALIUM) 5 MG tablet   Oral   Take 1 tablet (5 mg total) by mouth every 8 (eight) hours as needed (muscle spasm).   12 tablet   0   . naproxen (NAPROSYN) 375 MG tablet   Oral   Take 1 tablet (375 mg total) by mouth 2 (two) times daily as needed.   20 tablet   0   . oxyCODONE-acetaminophen (PERCOCET/ROXICET)  5-325 MG per tablet   Oral   Take 1-2 tablets by mouth every 4 (four) hours as needed for severe pain.   15 tablet   0    BP 150/92  Pulse 79  Temp(Src) 98.4 F (36.9 C) (Oral)  Resp 16  SpO2 98% Physical Exam  Nursing note and vitals reviewed. Constitutional: She appears well-developed and well-nourished. No distress.  HENT:  Head: Normocephalic and atraumatic.  Eyes: Conjunctivae are normal. Right eye exhibits no discharge. Left eye exhibits no discharge.  Neck: Neck supple.  Cardiovascular: Normal rate, regular rhythm and normal heart sounds.  Exam reveals no gallop and no friction rub.   No murmur heard. Pulmonary/Chest: Effort normal and breath sounds normal. No respiratory distress.  Abdominal: Soft. She exhibits no distension. There is no tenderness.  Musculoskeletal: She exhibits tenderness. She exhibits no edema.  Tenderness across the lumbar region both in the midline and paraspinally bilaterally. No concerning skin lesions. Reports increase in lower back pain with hip flexion bilaterally.  Neurological: She is alert.   Strength is 5 out of 5 bilateral lower extremities. Sensation is intact to light touch. Patellar reflexes 1+ bilaterally. Easily palpable dorsalis pedis pulses.  Skin: Skin is warm and dry.  Psychiatric: She has a normal mood and affect. Her behavior is normal. Thought content normal.    ED Course  Procedures (including critical care time) Labs Review Labs Reviewed - No data to display Imaging Review No results found.  EKG Interpretation   None       MDM   1. Back pain    52 year old female with back pain. Atraumatic. Nonfocal neurological examination. Afebrile and hemodynamically stable. No history of any back surgery or blood thinning medication usage aside from aspirin. Denies IV drug abuse. Very low suspicion for emergent etiology of her symptoms. Will treat symptomatically at this time. Return precautions were discussed. Outpatient followup.    Raeford Razor, MD 01/25/13 [phone]

## 2013-02-12 ENCOUNTER — Other Ambulatory Visit: Payer: Self-pay | Admitting: Cardiovascular Disease

## 2013-02-14 ENCOUNTER — Telehealth: Payer: Self-pay | Admitting: Diagnostic Neuroimaging

## 2013-02-14 MED ORDER — LEVETIRACETAM 500 MG PO TABS: 500.0000 mg | ORAL_TABLET | Freq: Two times a day (BID) | ORAL | Status: DC

## 2013-02-14 NOTE — Telephone Encounter (Signed)
Called patient to reschedule 12/30 appointment per Dr. Richrd Humbles schedule to 3/17. Patient states she will need refill on her meds to last until that appointment. Please call.

## 2013-02-22 ENCOUNTER — Other Ambulatory Visit: Payer: Self-pay | Admitting: Physician Assistant

## 2013-02-22 ENCOUNTER — Other Ambulatory Visit (HOSPITAL_COMMUNITY): Payer: Medicaid Other

## 2013-02-22 ENCOUNTER — Ambulatory Visit (HOSPITAL_COMMUNITY)
Admission: RE | Admit: 2013-02-22 | Discharge: 2013-02-22 | Disposition: A | Discharge status: Home / Self Care | Payer: Medicaid Other | Source: Ambulatory Visit | Attending: Physician Assistant | Admitting: Physician Assistant

## 2013-02-22 DIAGNOSIS — R079 Chest pain, unspecified: Secondary | ICD-10-CM

## 2013-03-06 ENCOUNTER — Ambulatory Visit: Payer: Self-pay | Admitting: Diagnostic Neuroimaging

## 2013-03-24 ENCOUNTER — Other Ambulatory Visit: Payer: Self-pay | Admitting: Cardiovascular Disease

## 2013-04-22 ENCOUNTER — Other Ambulatory Visit (HOSPITAL_COMMUNITY): Payer: Self-pay | Admitting: Internal Medicine

## 2013-04-25 ENCOUNTER — Other Ambulatory Visit: Payer: Self-pay

## 2013-04-25 MED ORDER — CARVEDILOL 12.5 MG PO TABS
12.5000 mg | ORAL_TABLET | Freq: Two times a day (BID) | ORAL | Status: DC
Start: 2013-04-25 — End: 2013-10-28

## 2013-05-22 ENCOUNTER — Encounter: Payer: Self-pay | Admitting: Diagnostic Neuroimaging

## 2013-05-22 ENCOUNTER — Ambulatory Visit (INDEPENDENT_AMBULATORY_CARE_PROVIDER_SITE_OTHER): Payer: Medicaid Other | Admitting: Diagnostic Neuroimaging

## 2013-05-22 ENCOUNTER — Encounter (INDEPENDENT_AMBULATORY_CARE_PROVIDER_SITE_OTHER): Payer: Self-pay

## 2013-05-22 VITALS — BP 126/95 | HR 76 | Temp 98.6°F | Ht 64.5 in | Wt 160.0 lb

## 2013-05-22 DIAGNOSIS — G40909 Epilepsy, unspecified, not intractable, without status epilepticus: Secondary | ICD-10-CM

## 2013-05-22 MED ORDER — LEVETIRACETAM 500 MG PO TABS: 500.0000 mg | ORAL_TABLET | Freq: Two times a day (BID) | ORAL | Status: DC

## 2013-05-22 NOTE — Progress Notes (Signed)
[facility]  PATIENT: [name] DOB: 1961/01/05  REFERRING CLINICIAN:  HISTORY FROM: patient  REASON FOR VISIT: follow up   HISTORICAL  CHIEF COMPLAINT:  Chief Complaint  Patient presents with  . Follow-up    sz    HISTORY OF PRESENT ILLNESS:   UPDATE 05/22/13: Doing well. No seizures since 11/11/11. Now on better schedule of LEV $Remo'500mg'zfGkt$  BID, without missing doses. 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 This is challenging and has caused her increased stress. Also, her younger brother also diagnosed with seizure disorder now.  UPDATE 11/15/11:  Felt on Thurs (11/11/11)had feelings of having seizure; she felt "out of body".  She was in the car and she had someone to come and pick her up.  She was aware of the situation.  She has missed an average of 3 hs doses/wk of LEV $Remo'500mg'kSWMs$ .  She has had increased stress with her mother's declining health.  Having difficulty with putting thoughts together and repeating words.  She is also having increased anxiety.    UPDATE 01/05/11: Doing well.  Tolerating meds.  Back to driving.  No seizure since Jun 24, 2009.  UPDATE 11/04/09: Doing well; no further seizures.  Having intermittent twitching in toes up to thighs; sometimes bilateral, sometimes unilateral; no triggers.  Still with short temper.  PRIOR HPI (07/22/09): 53 year old right-handed female with hypertension and atrial fibrillation presenting for evaluation of possible seizure disorder.  The patient has had 3 episodes in her life of altered or loss of consciousness.  The first episode occurred in the 1990s when she was a mall and a "strange feeling" with pain over her. She felt as though she was about to pass out. She was able to sit down and did not lose consciousness. She was somewhat confused. Her brother came to pick her up and she did not seek medical attention.  The second episode occurred in 2004  when she woke up in the middle of the night feeling very hot. She went to the kitchen and the next thing she remembers is on the floor with her daughter standing over her. She was evaluated in the hospital MRI of the brain revealed a right parietal periventricular focus of gliosis (which I reviewed and agree with report).  Followup MRIs demonstrated stability of this lesion.  June 24, 2009 the patient was at the beauty salon, when she all of a sudden she began to profusely sweating. She felt as though she was about to go into the basin.  Someone gave her water; she was able to drink and she ended up going into a "stare" and that her eyes rolled back and she began to shake all over. Unclear what the duration of this event was. She did not get her tongue. He did not have urinary or bowel incontinence. Paramedics were called to the scene and reportedly she had a "weak pulse" (no documentation of this).  She was evaluated at [facility] emergency room with CT scan and MRI of the brain, which show  Since discharge she is had no further events. She does have some fatigue and mood swings with this medication. In retrospect patient has been having episodes of dj vu and out of body experience. She denies olfactory hallucinations. She has a family history of seizure (sister).  She denies any prior head trauma. Denies meningitis or encephalitis. She did well in school (honor roll).  She is not  driving presently.  REVIEW OF SYSTEMS: Full 14 system review of systems performed and notable only for easy bruising and bleeding memory loss speech difficulty confusion decreased haustration nervousness anxiety frequent waking palpitations strolling fatigue eye redness flushing.  ALLERGIES: Allergies  Allergen Reactions  . Penicillins     HOME MEDICATIONS: Outpatient Prescriptions Prior to Visit  Medication Sig Dispense Refill  . ALPRAZolam (XANAX) 0.5 MG tablet Take 0.5 mg by mouth at bedtime as needed.      Marland Kitchen  amLODipine (NORVASC) 5 MG tablet TAKE ONE TABLET BY MOUTH ONCE DAILY  30 tablet  6  . aspirin 325 MG tablet Take 325 mg by mouth daily.      . carvedilol (COREG) 12.5 MG tablet Take 1 tablet (12.5 mg total) by mouth 2 (two) times daily with a meal.  60 tablet  4  . diazepam (VALIUM) 5 MG tablet Take 1 tablet (5 mg total) by mouth every 8 (eight) hours as needed (muscle spasm).  12 tablet  0  . HYZAAR 100-25 MG per tablet TAKE ONE TABLET BY MOUTH ONCE DAILY  30 tablet  5  . ibuprofen (ADVIL,MOTRIN) 800 MG tablet Take 1 tablet (800 mg total) by mouth 3 (three) times daily.  21 tablet  0  . Multiple Vitamins-Minerals (MULTIVITAMIN WITH MINERALS) tablet Take 1 tablet by mouth daily.      . naproxen (NAPROSYN) 375 MG tablet Take 1 tablet (375 mg total) by mouth 2 (two) times daily as needed.  20 tablet  0  . oxyCODONE-acetaminophen (PERCOCET/ROXICET) 5-325 MG per tablet Take 1-2 tablets by mouth every 4 (four) hours as needed for severe pain.  15 tablet  0  . levETIRAcetam (KEPPRA) 500 MG tablet Take 1 tablet (500 mg total) by mouth 2 (two) times daily.  60 tablet  3   No facility-administered medications prior to visit.    PAST MEDICAL HISTORY: Past Medical History  Diagnosis Date  . Atrial fibrillation   . Hypertension   . Back pain   . Seizures     PAST SURGICAL HISTORY: Past Surgical History  Procedure Laterality Date  . Cesarean section      FAMILY HISTORY: Family History  Problem Relation Age of Onset  . Seizures Sister   . Seizures Brother     SOCIAL HISTORY:  History   Social History  . Marital Status: Single    Spouse Name: N/A    Number of Children: 2  . Years of Education: College   Occupational History  .  Other    n/a   Social History Main Topics  . Smoking status: Current Every Day Smoker    Types: Cigarettes  . Smokeless tobacco: Not on file  . Alcohol Use: Yes  . Drug Use: No  . Sexual Activity: Not on file   Other Topics Concern  . Not on file    Social History Narrative   Patient lives at home alone   Caffeine Use: 2-3 cups daily     PHYSICAL EXAM  Filed Vitals:   05/22/13 0845  BP: 126/95  Pulse: 76  Temp: 98.6 F (37 C)  TempSrc: Oral  Height: 5' 4.5" (1.638 m)  Weight: 160 lb (72.576 kg)    Not recorded    Body mass index is 27.05 kg/(m^2).  GENERAL EXAM: Patient is in no distress; well developed, nourished and groomed; neck is supple  CARDIOVASCULAR: Regular rate and rhythm, no murmurs, no carotid bruits  NEUROLOGIC: MENTAL STATUS: awake, alert, oriented  to person, place and time, recent and remote memory intact, normal attention and concentration, language fluent, comprehension intact, naming intact, fund of knowledge appropriate CRANIAL NERVE: no papilledema on fundoscopic exam, pupils equal and reactive to light, visual fields full to confrontation, extraocular muscles intact, no nystagmus, facial sensation and strength symmetric, hearing intact, palate elevates symmetrically, uvula midline, shoulder shrug symmetric, tongue midline. MOTOR: normal bulk and tone, full strength in the BUE, BLE SENSORY: normal and symmetric to light touch COORDINATION: finger-nose-finger, fine finger movements normal REFLEXES: deep tendon reflexes present and symmetric GAIT/STATION: narrow based gait; able to walk on toes, heels and tandem; romberg is negative    DIAGNOSTIC DATA (LABS, IMAGING, TESTING) - I reviewed patient records, labs, notes, testing and imaging myself where available.  Lab Results  Component Value Date   WBC 6.7 10/29/2009   HGB 12.8 10/29/2009   HCT 37.9 10/29/2009   MCV 87.5 10/29/2009   PLT 217.0 10/29/2009      Component Value Date/Time   NA 140 10/29/2009 0000   K 4.0 10/29/2009 0000   CL 103 10/29/2009 0000   CO2 28 10/29/2009 0000   GLUCOSE 109* 10/29/2009 0000   BUN 16 10/29/2009 0000   CREATININE 0.9 10/29/2009 0000   CALCIUM 9.9 10/29/2009 0000   GFRNONAA 84.77 10/29/2009 0000   GFRAA   Value: >60        The eGFR has been calculated using the MDRD equation. This calculation has not been validated in all clinical situations. eGFR's persistently <60 mL/min signify possible Chronic Kidney Disease. 06/24/2009 1120   No results found for this basename: CHOL, HDL, LDLCALC, LDLDIRECT, TRIG, CHOLHDL   No results found for this basename: HGBA1C   No results found for this basename: VITAMINB12   Lab Results  Component Value Date   TSH 1.16 10/29/2009    I reviewed images myself and agree with interpretation. -VRP  06/24/09 MRI brain - No acute abnormality. The right posterior temporal white matter  lesion is stable since 2005. This may represent chronic ischemia  or demyelinating disease. No other lesions are present.  08/01/09 EEG - normal    ASSESSMENT AND PLAN  53 y.o. year old female with hypertension and paroxysmal atrial fibrillation (CHA2DS2-VASc = 2 (HTN, female) with intermittent episodes of loss of consciousness and convulsions. Last event 11/11/11. Doing well on LEV 500mg  BID.   She has had one prior episode of loss of consciousness and another episode of altered consciousness.  She has some prodromal symptoms of syncope (sweating, lightheadedness, "weak pulse") but other features of complex partial seizures (deja vu, out of body experience).  She had witnessed convulsions recently, no tongue biting or incontinence, and some post-ictal fatigue/confusion.  MRI brain also demonstrates a right parietal, periventricular focus of gliosis, unclear etiology.  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 Burt Knack)  PLAN: 1. continue levetiracetam 500mg  BID  Meds ordered this encounter  Medications  . levETIRAcetam (KEPPRA) 500 MG tablet    Sig: Take 1 tablet (500 mg total) by mouth 2 (two)  times daily.    Dispense:  180 tablet    Refill:  4    Return in about 1 year (around 05/23/2014) for with Charlott Holler or Penumalli.    Penni Bombard, MD 0/15/8682, 5:74 AM Certified in Neurology, Neurophysiology and Neuroimaging  [facility] [phone]  [address] [place], [place] [address] [phone]

## 2013-05-22 NOTE — Patient Instructions (Signed)
Continue levetiracetam. 

## 2013-09-13 ENCOUNTER — Other Ambulatory Visit: Payer: Self-pay | Admitting: Cardiovascular Disease

## 2013-10-28 ENCOUNTER — Other Ambulatory Visit: Payer: Self-pay | Admitting: Cardiovascular Disease

## 2014-01-24 ENCOUNTER — Other Ambulatory Visit: Payer: Self-pay | Admitting: *Deleted

## 2014-01-24 MED ORDER — LOSARTAN POTASSIUM-HCTZ 100-25 MG PO TABS: 1.0000 | ORAL_TABLET | Freq: Every day | ORAL | Status: DC

## 2014-01-28 ENCOUNTER — Other Ambulatory Visit: Payer: Self-pay

## 2014-01-28 MED ORDER — AMLODIPINE BESYLATE 5 MG PO TABS: 5.0000 mg | ORAL_TABLET | Freq: Every day | ORAL | Status: DC

## 2014-02-12 ENCOUNTER — Other Ambulatory Visit: Payer: Self-pay | Admitting: Cardiovascular Disease

## 2014-03-22 ENCOUNTER — Other Ambulatory Visit: Payer: Self-pay | Admitting: Cardiovascular Disease

## 2014-03-25 ENCOUNTER — Other Ambulatory Visit: Payer: Self-pay | Admitting: *Deleted

## 2014-03-25 MED ORDER — LOSARTAN POTASSIUM-HCTZ 100-25 MG PO TABS: 1.0000 | ORAL_TABLET | Freq: Every day | ORAL | Status: DC

## 2014-04-15 ENCOUNTER — Ambulatory Visit: Payer: Self-pay | Admitting: Physician Assistant

## 2014-04-19 ENCOUNTER — Ambulatory Visit: Payer: Self-pay | Admitting: Physician Assistant

## 2014-04-24 ENCOUNTER — Other Ambulatory Visit: Payer: Self-pay | Admitting: Cardiovascular Disease

## 2014-04-26 ENCOUNTER — Other Ambulatory Visit: Payer: Self-pay

## 2014-04-30 ENCOUNTER — Encounter: Payer: Self-pay | Admitting: Physician Assistant

## 2014-04-30 ENCOUNTER — Ambulatory Visit (INDEPENDENT_AMBULATORY_CARE_PROVIDER_SITE_OTHER): Payer: No Typology Code available for payment source | Admitting: Physician Assistant

## 2014-04-30 VITALS — BP 132/90 | HR 74 | Ht 64.5 in | Wt 150.0 lb

## 2014-04-30 DIAGNOSIS — I10 Essential (primary) hypertension: Secondary | ICD-10-CM

## 2014-04-30 MED ORDER — CARVEDILOL 12.5 MG PO TABS: ORAL_TABLET | ORAL | Status: DC

## 2014-04-30 MED ORDER — LOSARTAN POTASSIUM-HCTZ 100-25 MG PO TABS: ORAL_TABLET | ORAL | Status: DC

## 2014-04-30 MED ORDER — AMLODIPINE BESYLATE 5 MG PO TABS: 5.0000 mg | ORAL_TABLET | Freq: Every day | ORAL | Status: DC

## 2014-04-30 NOTE — Progress Notes (Signed)
Cardiology Office Note   Date:  04/30/2014   ID:  [name], DOB 1960-03-24, MRN [id number]  PCP:  Alva Garnet., MD  Cardiologist:  Dr. Excell Seltzer  PAF yearly follow up.     History of Present Illness: [name] is a 54 y.o. female with a history of PAF on ASA, HTN and seizure disorder who presents for regular cardiology follow up today.   She was last seen by Dr. Excell Seltzer in 01/2013. The patient is followed for paroxysmal atrial fibrillation with no symptomatic episodes in several years. She's been maintained on aspirin alone with a CHADS-Vasc of 2 (female, HTN) and no atrial fib x several years. She also has a seizure disorder followed by Dr. Marjory Lies but no seizures in the past few years.  From a cardiac perspective she is doing well. She has had no recent palpitations. She denies chest pain, lightheadedness, edema, or shortness of breath.   The patient continues to smoke 1.5 pack per day. She has quit a few times in the past but has not been able to stay off of cigarettes long-term.  Past Medical History  Diagnosis Date  . Atrial fibrillation   . Hypertension   . Back pain   . Seizures     Past Surgical History  Procedure Laterality Date  . Cesarean section       Current Outpatient Prescriptions  Medication Sig Dispense Refill  . ALPRAZolam (XANAX) 0.5 MG tablet Take 0.5 mg by mouth at bedtime as needed.    Marland Kitchen amLODipine (NORVASC) 5 MG tablet Take 1 tablet (5 mg total) by mouth daily. 30 tablet 3  . aspirin 325 MG tablet Take 325 mg by mouth daily.    . carvedilol (COREG) 12.5 MG tablet TAKE ONE TABLET BY MOUTH TWICE DAILY WITH MEALS **  PATIENT  NEEDS  AN  APPOINTMENT  ** 60 tablet 0  . diazepam (VALIUM) 5 MG tablet Take 1 tablet (5 mg total) by mouth every 8 (eight) hours as needed (muscle spasm). 12 tablet 0  . ibuprofen (ADVIL,MOTRIN) 800 MG tablet Take 1 tablet (800 mg total) by mouth 3 (three) times daily. 21 tablet 0  . levETIRAcetam (KEPPRA) 500 MG  tablet Take 1 tablet (500 mg total) by mouth 2 (two) times daily. 180 tablet 4  . losartan-hydrochlorothiazide (HYZAAR) 100-25 MG per tablet TAKE ONE TABLET BY MOUTH ONCE DAILY **NEEDS TO MAKE AN APPOINTMENT WITH DR. Excell Seltzer NO FURTHER REFILLS** 30 tablet 0  . Multiple Vitamins-Minerals (MULTIVITAMIN WITH MINERALS) tablet Take 1 tablet by mouth daily.    . naproxen (NAPROSYN) 375 MG tablet Take 1 tablet (375 mg total) by mouth 2 (two) times daily as needed. 20 tablet 0  . oxyCODONE-acetaminophen (PERCOCET/ROXICET) 5-325 MG per tablet Take 1-2 tablets by mouth every 4 (four) hours as needed for severe pain. 15 tablet 0   No current facility-administered medications for this visit.    Allergies:   Penicillins    Social History:  The patient  reports that she has been smoking Cigarettes.  She does not have any smokeless tobacco history on file. She reports that she drinks alcohol. She reports that she does not use illicit drugs.   Family History:  The patient's family history includes Seizures in her brother and sister.    ROS:  Please see the history of present illness.   Otherwise, review of systems are positive for none.   All other systems are reviewed and negative.    PHYSICAL EXAM: VS:  BP 132/90 mmHg  Pulse 74  Ht 5' 4.5" (1.638 m)  Wt 150 lb (68.04 kg)  BMI 25.36 kg/m2 , BMI Body mass index is 25.36 kg/(m^2). GEN: Well nourished, well developed, in no acute distress HEENT: normal Neck: no JVD, carotid bruits, or masses Cardiac: RRR; no murmurs, rubs, or gallops,no edema  Respiratory:  clear to auscultation bilaterally, normal work of breathing GI: soft, nontender, nondistended, + BS MS: no deformity or atrophy Skin: warm and dry, no rash Neuro:  Strength and sensation are intact Psych: euthymic mood, full affect   EKG:  EKG is ordered today. The ekg ordered today demonstrates  HR 74. NSR with NS- ST&TW abnormality.   Recent Labs: No results found for requested labs  within last 365 days.    Lipid Panel No results found for: CHOL, TRIG, HDL, CHOLHDL, VLDL, LDLCALC, LDLDIRECT    Wt Readings from Last 3 Encounters:  04/30/14 150 lb (68.04 kg)  05/22/13 160 lb (72.576 kg)  01/17/13 159 lb (72.122 kg)      Other studies Reviewed: Additional studies/ records that were reviewed today include: 2D ECHO (2009)  Review of the above records demonstrates:  -- 2D ECHO (2009) normal LV fxn. No RWMA. Mod LVH.    ASSESSMENT AND PLAN:  [name] is a 54 y.o. female with a history of PAF on ASA, HTN and seizure disorder who presents for regular cardiology follow up today.   Paroxysmal atrial fibrillation. No documented episodes of atrial fib in many years. Will continue current program which includes aspirin alone. Her heart is structurally normal and considering her seizure disorder I think the risk-benefit of anticoagulation is unfavorable.  Essential hypertension. Blood pressure well controlled on a combination of amlodipine, carvedilol, losartan, and hydrochlorothiazide. -- Today BP 132/90 mg HG. Will continue all same medications.   Tobacco abuse- up to 1.5 PPD. She does not want to take any medications (wellbutrin CI due to seizures and worried about bad dreams with Chantix)  She wants to quit but never feels like there is a good time. We discussed nicotine gum/losenges. She will try this    Current medicines are reviewed at length with the patient today.  The patient has concerns regarding medicines.  The following changes have been made:  no change  Labs/ tests ordered today include:  No orders of the defined types were placed in this encounter.     Disposition:  For followup Dr Excell Seltzerooper will see her back in 12 months. Labs are checked regularly by Dr. Renae GlossShelton.  Charlestine MassedSigned, Mariadelcarmen Corella R, PA-C  04/30/2014 10:24 AM    [facility] [address], [place], [place]  [id number] Phone: [phone]; Fax: [phone]

## 2014-04-30 NOTE — Patient Instructions (Signed)
Your physician recommends that you continue on your current medications as directed. Please refer to the Current Medication list given to you today.  PRESCRIPTIONS HAS BEEN SENT IN TO PHARMACY   AMLODIPINE   LOSARTAN /HCTZ  CARVEDILOL   Your physician wants you to follow-up in:  WITH DR Excell SeltzerOOPER IN ONE YEAR  You will receive a reminder letter in the mail two months in advance. If you don't receive a letter, please call our office to schedule the follow-up appointment.

## 2014-05-22 ENCOUNTER — Ambulatory Visit (INDEPENDENT_AMBULATORY_CARE_PROVIDER_SITE_OTHER): Payer: PRIVATE HEALTH INSURANCE | Admitting: Diagnostic Neuroimaging

## 2014-05-22 ENCOUNTER — Encounter: Payer: Self-pay | Admitting: Diagnostic Neuroimaging

## 2014-05-22 VITALS — BP 140/90 | HR 66 | Ht 64.0 in | Wt 152.2 lb

## 2014-05-22 DIAGNOSIS — Z720 Tobacco use: Secondary | ICD-10-CM

## 2014-05-22 DIAGNOSIS — G40909 Epilepsy, unspecified, not intractable, without status epilepticus: Secondary | ICD-10-CM | POA: Diagnosis not present

## 2014-05-22 MED ORDER — LEVETIRACETAM 500 MG PO TABS: 500.0000 mg | ORAL_TABLET | Freq: Two times a day (BID) | ORAL | Status: DC

## 2014-05-22 NOTE — Progress Notes (Signed)
[facility]  PATIENT: [name] DOB: Jul 07, 1960  REFERRING CLINICIAN:  HISTORY FROM: patient  REASON FOR VISIT: follow up   HISTORICAL  CHIEF COMPLAINT:  Chief Complaint  Patient presents with  . Follow-up    HISTORY OF PRESENT ILLNESS:   UPDATE 05/22/14: Since last visit, doing well. No seizures. Some intermittent eyelid and lip twitching. Int toe spasms. Overall doing well.   UPDATE 05/22/13: Doing well. No seizures since 11/11/11. Now on better schedule of LEV 59m BID, without missing doses. 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 This is challenging and has caused her increased stress. Also, her younger brother also diagnosed with seizure disorder now.  UPDATE 11/15/11:  Felt on Thurs (11/11/11)had feelings of having seizure; she felt "out of body".  She was in the car and she had someone to come and pick her up.  She was aware of the situation.  She has missed an average of 3 hs doses/wk of LEV 507m  She has had increased stress with her mother's declining health.  Having difficulty with putting thoughts together and repeating words.  She is also having increased anxiety.    UPDATE 01/05/11: Doing well.  Tolerating meds.  Back to driving.  No seizure since Jun 24, 2009.  UPDATE 11/04/09: Doing well; no further seizures.  Having intermittent twitching in toes up to thighs; sometimes bilateral, sometimes unilateral; no triggers.  Still with short temper.  PRIOR HPI (07/22/09): [age]ear old right-handed female with hypertension and atrial fibrillation presenting for evaluation of possible seizure disorder. The patient has had 3 episodes in her life of altered or loss of consciousness.  The first episode occurred in the 1990s when she was a mall and a "strange feeling" with pain over her. She felt as though she was about to pass out. She was able to sit down and did not lose consciousness. She was  somewhat confused. Her brother came to pick her up and she did not seek medical attention. The second episode occurred in 2004 when she woke up in the middle of the night feeling very hot. She went to the kitchen and the next thing she remembers is on the floor with her daughter standing over her. She was evaluated in the hospital MRI of the brain revealed a right parietal periventricular focus of gliosis (which I reviewed and agree with report).  Followup MRIs demonstrated stability of this lesion. June 24, 2009 the patient was at the beauty salon, when she all of a sudden she began to profusely sweating. She felt as though she was about to go into the base. In PT she gave her a walker she was able to drink and she ended up going into a "stare" and that her eyes rolled back and she began to shake all over. Unclear what the duration of this event was. She did not get her tongue. He did not have urinary or bowel incontinence. Paramedics were called to the scene and reportedly she had a "weak pulse" (no documentation of this).  [facility] and MRI of the brain, which showed stability of the right parietal gliosis (I reviewed images and agree). I discussed case with the emergency room clinicians in the evening patient was started on levetiracetam 500 mg BID. Since discharge she is had no further events. She does have some fatigue and mood swings with this medication. In retrospect patient  has been having episodes of dj vu and out of body experience. She denies olfactory hallucinations. She has a family history of seizure (sister).  She denies any prior head trauma. Denies meningitis or encephalitis. She did well in school (honor roll).  She is not driving presently.   REVIEW OF SYSTEMS: Full 14 system review of systems performed and notable only for fatigue eye redness easy bruising neck stiffness.   ALLERGIES: Allergies  Allergen Reactions  . Penicillins      HOME MEDICATIONS: Outpatient Prescriptions Prior to Visit  Medication Sig Dispense Refill  . ALPRAZolam (XANAX) 0.5 MG tablet Take 0.5 mg by mouth at bedtime as needed.    Marland Kitchen amLODipine (NORVASC) 5 MG tablet Take 1 tablet (5 mg total) by mouth daily. 30 tablet 10  . aspirin 325 MG tablet Take 325 mg by mouth daily.    . carvedilol (COREG) 12.5 MG tablet TAKE ONE TABLET BY MOUTH TWICE DAILY WITH MEALS 60 tablet 10  . diazepam (VALIUM) 5 MG tablet Take 1 tablet (5 mg total) by mouth every 8 (eight) hours as needed (muscle spasm). 12 tablet 0  . ibuprofen (ADVIL,MOTRIN) 800 MG tablet Take 1 tablet (800 mg total) by mouth 3 (three) times daily. 21 tablet 0  . losartan-hydrochlorothiazide (HYZAAR) 100-25 MG per tablet TAKE ONE TABLET BY MOUTH ONCE DAILY 30 tablet 10  . Multiple Vitamins-Minerals (MULTIVITAMIN WITH MINERALS) tablet Take 1 tablet by mouth daily.    . naproxen (NAPROSYN) 375 MG tablet Take 1 tablet (375 mg total) by mouth 2 (two) times daily as needed. 20 tablet 0  . oxyCODONE-acetaminophen (PERCOCET/ROXICET) 5-325 MG per tablet Take 1-2 tablets by mouth every 4 (four) hours as needed for severe pain. 15 tablet 0  . levETIRAcetam (KEPPRA) 500 MG tablet Take 1 tablet (500 mg total) by mouth 2 (two) times daily. 180 tablet 4   No facility-administered medications prior to visit.    PAST MEDICAL HISTORY: Past Medical History  Diagnosis Date  . Atrial fibrillation   . Hypertension   . Back pain   . Seizures     PAST SURGICAL HISTORY: Past Surgical History  Procedure Laterality Date  . Cesarean section      FAMILY HISTORY: Family History  Problem Relation Age of Onset  . Seizures Sister   . Seizures Brother     SOCIAL HISTORY:  History   Social History  . Marital Status: Single    Spouse Name: N/A  . Number of Children: 2  . Years of Education: College   Occupational History  .  Other    n/a   Social History Main Topics  . Smoking status: Current Every  Day Smoker    Types: Cigarettes  . Smokeless tobacco: Not on file  . Alcohol Use: 0.0 oz/week    0 Standard drinks or equivalent per week     Comment: ocassionally  . Drug Use: No  . Sexual Activity: Not on file   Other Topics Concern  . Not on file   Social History Narrative   Patient lives at home alone   Caffeine Use: 2-3 cups daily Coffee     PHYSICAL EXAM  Filed Vitals:   05/22/14 1217  BP: 140/90  Pulse: 66  Height: _0  (1.626 m)  Weight: 152 lb 3.2 oz (69.037 kg)    Not recorded      Body mass index is 26.11 kg/(m^2).  GENERAL EXAM: Patient is in no distress; well developed, nourished and groomed;  neck is supple  CARDIOVASCULAR: Regular rate and rhythm, no murmurs, no carotid bruits  NEUROLOGIC: MENTAL STATUS: awake, alert, language fluent, comprehension intact, naming intact, fund of knowledge appropriate CRANIAL NERVE: pupils equal and reactive to light, visual fields full to confrontation, extraocular muscles intact, no nystagmus, facial sensation and strength symmetric, hearing intact, palate elevates symmetrically, uvula midline, shoulder shrug symmetric, tongue midline. MOTOR: normal bulk and tone, full strength in the BUE, BLE SENSORY: normal and symmetric to light touch COORDINATION: finger-nose-finger, fine finger movements normal REFLEXES: deep tendon reflexes present and symmetric GAIT/STATION: narrow based gait; romberg is negative    DIAGNOSTIC DATA (LABS, IMAGING, TESTING) - I reviewed patient records, labs, notes, testing and imaging myself where available.  Lab Results  Component Value Date   WBC 6.7 10/29/2009   HGB 12.8 10/29/2009   HCT 37.9 10/29/2009   MCV 87.5 10/29/2009   PLT 217.0 10/29/2009      Component Value Date/Time   NA 140 10/29/2009 0000   K 4.0 10/29/2009 0000   CL 103 10/29/2009 0000   CO2 28 10/29/2009 0000   GLUCOSE 109* 10/29/2009 0000   BUN 16 10/29/2009 0000   CREATININE 0.9 10/29/2009 0000   CALCIUM  9.9 10/29/2009 0000   GFRNONAA 84.77 10/29/2009 0000   GFRAA  06/24/2009 1120    >60        The eGFR has been calculated using the MDRD equation. This calculation has not been validated in all clinical situations. eGFR's persistently <60 mL/min signify possible Chronic Kidney Disease.   No results found for: CHOL No results found for: HGBA1C No results found for: VITAMINB12 Lab Results  Component Value Date   TSH 1.16 10/29/2009    I reviewed images myself and agree with interpretation. -VRP  06/24/09 MRI brain - No acute abnormality. The right posterior temporal white matter  lesion is stable since 2005. This may represent chronic ischemia  or demyelinating disease. No other lesions are present.  08/01/09 EEG - normal    ASSESSMENT AND PLAN  54 y.o. year old female with hypertension and paroxysmal atrial fibrillation (CHA2DS2-VASc = 2 (HTN, female) with intermittent episodes of loss of consciousness and convulsions. Last event 11/11/11. Doing well on LEV 547m BID.   She has had one prior episode of loss of consciousness and another episode of altered consciousness.  She has some prodromal symptoms of syncope (sweating, lightheadedness, "weak pulse") but other features of complex partial seizures (deja vu, out of body experience).  She had witnessed convulsions recently, no tongue biting or incontinence, and some post-ictal fatigue/confusion.  MRI brain also demonstrates a right parietal, periventricular focus of gliosis, unclear etiology.  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 CBurt Knack  PLAN: 1. continue levetiracetam 5022mBID  Meds ordered this encounter  Medications  . levETIRAcetam (KEPPRA) 500 MG tablet    Sig: Take 1 tablet (500 mg total) by mouth 2 (two) times daily.     Dispense:  180 tablet    Refill:  4    Return in about 1 year (around 05/22/2015).  I spent 15 minutes of face to face time with patient. Greater than 50% of time was spent in counseling and coordination of care with patient. In summary we discussed seizure diagnosis, treatment, options for tapering in future, driving safety.    VIPenni BombardMD 05/22/2014,  02:11 PM Certified in Neurology, Neurophysiology and [facility] [address] [place], [place] [address] [phone]

## 2014-05-22 NOTE — Patient Instructions (Signed)
Continue current medications.  Try to reduce caffeine and cigarette use.

## 2014-09-18 ENCOUNTER — Encounter (HOSPITAL_COMMUNITY): Payer: Self-pay | Admitting: *Deleted

## 2014-09-18 ENCOUNTER — Emergency Department (HOSPITAL_COMMUNITY)
Admission: EM | Admit: 2014-09-18 | Discharge: 2014-09-18 | Disposition: A | Discharge status: Home / Self Care | Payer: 59 | Attending: Emergency Medicine | Admitting: Emergency Medicine

## 2014-09-18 DIAGNOSIS — I4891 Unspecified atrial fibrillation: Secondary | ICD-10-CM | POA: Diagnosis not present

## 2014-09-18 DIAGNOSIS — Z880 Allergy status to penicillin: Secondary | ICD-10-CM | POA: Diagnosis not present

## 2014-09-18 DIAGNOSIS — I10 Essential (primary) hypertension: Secondary | ICD-10-CM | POA: Diagnosis not present

## 2014-09-18 DIAGNOSIS — Z79899 Other long term (current) drug therapy: Secondary | ICD-10-CM | POA: Insufficient documentation

## 2014-09-18 DIAGNOSIS — M545 Low back pain, unspecified: Secondary | ICD-10-CM

## 2014-09-18 DIAGNOSIS — G40909 Epilepsy, unspecified, not intractable, without status epilepticus: Secondary | ICD-10-CM | POA: Diagnosis not present

## 2014-09-18 DIAGNOSIS — M549 Dorsalgia, unspecified: Secondary | ICD-10-CM | POA: Diagnosis present

## 2014-09-18 DIAGNOSIS — Z791 Long term (current) use of non-steroidal anti-inflammatories (NSAID): Secondary | ICD-10-CM | POA: Diagnosis not present

## 2014-09-18 DIAGNOSIS — Z7982 Long term (current) use of aspirin: Secondary | ICD-10-CM | POA: Diagnosis not present

## 2014-09-18 DIAGNOSIS — Z720 Tobacco use: Secondary | ICD-10-CM | POA: Insufficient documentation

## 2014-09-18 LAB — URINALYSIS, ROUTINE W REFLEX MICROSCOPIC
Bilirubin Urine: NEGATIVE
GLUCOSE, UA: NEGATIVE mg/dL
HGB URINE DIPSTICK: NEGATIVE
Ketones, ur: NEGATIVE mg/dL
LEUKOCYTES UA: NEGATIVE
Nitrite: NEGATIVE
Protein, ur: NEGATIVE mg/dL
SPECIFIC GRAVITY, URINE: 1.008 (ref 1.005–1.030)
Urobilinogen, UA: 0.2 mg/dL (ref 0.0–1.0)
pH: 6 (ref 5.0–8.0)

## 2014-09-18 MED ORDER — DIAZEPAM 5 MG PO TABS: 5.0000 mg | ORAL_TABLET | Freq: Three times a day (TID) | ORAL | Status: DC | PRN

## 2014-09-18 MED ORDER — KETOROLAC TROMETHAMINE 60 MG/2ML IM SOLN
30.0000 mg | Freq: Once | INTRAMUSCULAR | Status: AC
  Administered 2014-09-18: 30 mg via INTRAMUSCULAR
  Filled 2014-09-18: qty 2

## 2014-09-18 MED ORDER — NAPROXEN 500 MG PO TABS: 500.0000 mg | ORAL_TABLET | Freq: Two times a day (BID) | ORAL | Status: DC

## 2014-09-18 MED ORDER — DIAZEPAM 5 MG PO TABS
5.0000 mg | ORAL_TABLET | Freq: Once | ORAL | Status: AC
  Administered 2014-09-18: 5 mg via ORAL
  Filled 2014-09-18: qty 1

## 2014-09-18 MED ORDER — OXYCODONE-ACETAMINOPHEN 5-325 MG PO TABS: 1.0000 | ORAL_TABLET | ORAL | Status: DC | PRN

## 2014-09-18 MED ORDER — OXYCODONE-ACETAMINOPHEN 5-325 MG PO TABS
1.0000 | ORAL_TABLET | Freq: Once | ORAL | Status: AC
  Administered 2014-09-18: 1 via ORAL
  Filled 2014-09-18: qty 1

## 2014-09-18 NOTE — Discharge Instructions (Signed)
Take naprosyn and percocet for pain. Valium for spasms. Stretch your back. Try heating pad. Follow up with your doctor for recheck. Return if worsening symptom.   Back Pain, Adult Low back pain is very common. About 1 in 5 people have back pain.The cause of low back pain is rarely dangerous. The pain often gets better over time.About half of people with a sudden onset of back pain feel better in just 2 weeks. About 8 in 10 people feel better by 6 weeks.  CAUSES Some common causes of back pain include:  Strain of the muscles or ligaments supporting the spine.  Wear and tear (degeneration) of the spinal discs.  Arthritis.  Direct injury to the back. DIAGNOSIS Most of the time, the direct cause of low back pain is not known.However, back pain can be treated effectively even when the exact cause of the pain is unknown.Answering your caregiver's questions about your overall health and symptoms is one of the most accurate ways to make sure the cause of your pain is not dangerous. If your caregiver needs more information, he or she may order lab work or imaging tests (X-rays or MRIs).However, even if imaging tests show changes in your back, this usually does not require surgery. HOME CARE INSTRUCTIONS For many people, back pain returns.Since low back pain is rarely dangerous, it is often a condition that people can learn to [facility] their own.   Remain active. It is stressful on the back to sit or stand in one place. Do not sit, drive, or stand in one place for more than 30 minutes at a time. Take short walks on level surfaces as soon as pain allows.Try to increase the length of time you walk each day.  Do not stay in bed.Resting more than 1 or 2 days can delay your recovery.  Do not avoid exercise or work.Your body is made to move.It is not dangerous to be active, even though your back may hurt.Your back will likely heal faster if you return to being active before your pain is  gone.  Pay attention to your body when you bend and lift. Many people have less discomfortwhen lifting if they bend their knees, keep the load close to their bodies,and avoid twisting. Often, the most comfortable positions are those that put less stress on your recovering back.  Find a comfortable position to sleep. Use a firm mattress and lie on your side with your knees slightly bent. If you lie on your back, put a pillow under your knees.  Only take over-the-counter or prescription medicines as directed by your caregiver. Over-the-counter medicines to reduce pain and inflammation are often the most helpful.Your caregiver may prescribe muscle relaxant drugs.These medicines help dull your pain so you can more quickly return to your normal activities and healthy exercise.  Put ice on the injured area.  Put ice in a plastic bag.  Place a towel between your skin and the bag.  Leave the ice on for 15-20 minutes, 03-04 times a day for the first 2 to 3 days. After that, ice and heat may be alternated to reduce pain and spasms.  Ask your caregiver about trying back exercises and gentle massage. This may be of some benefit.  Avoid feeling anxious or stressed.Stress increases muscle tension and can worsen back pain.It is important to recognize when you are anxious or stressed and learn ways to manage it.Exercise is a great option. SEEK MEDICAL CARE IF:  You have pain that is not relieved  with rest or medicine.  You have pain that does not improve in 1 week.  You have new symptoms.  You are generally not feeling well. SEEK IMMEDIATE MEDICAL CARE IF:   You have pain that radiates from your back into your legs.  You develop new bowel or bladder control problems.  You have unusual weakness or numbness in your arms or legs.  You develop nausea or vomiting.  You develop abdominal pain.  You feel faint. Document Released: 02/22/2005 Document Revised: 08/24/2011 Document Reviewed:  06/26/2013 [facility] Patient Information 2015 [place], [place]. This information is not intended to replace advice given to you by your health care provider. Make sure you discuss any questions you have with your health care provider.   Back Exercises Back exercises help treat and prevent back injuries. The goal of back exercises is to increase the strength of your abdominal and back muscles and the flexibility of your back. These exercises should be started when you no longer have back pain. Back exercises include:  Pelvic Tilt. Lie on your back with your knees bent. Tilt your pelvis until the lower part of your back is against the floor. Hold this position 5 to 10 sec and repeat 5 to 10 times.  Knee to Chest. Pull first 1 knee up against your chest and hold for 20 to 30 seconds, repeat this with the other knee, and then both knees. This may be done with the other leg straight or bent, whichever feels better.  Sit-Ups or Curl-Ups. Bend your knees 90 degrees. Start with tilting your pelvis, and do a partial, slow sit-up, lifting your trunk only 30 to 45 degrees off the floor. Take at least 2 to 3 seconds for each sit-up. Do not do sit-ups with your knees out straight. If partial sit-ups are difficult, simply do the above but with only tightening your abdominal muscles and holding it as directed.  Hip-Lift. Lie on your back with your knees flexed 90 degrees. Push down with your feet and shoulders as you raise your hips a couple inches off the floor; hold for 10 seconds, repeat 5 to 10 times.  Back arches. Lie on your stomach, propping yourself up on bent elbows. Slowly press on your hands, causing an arch in your low back. Repeat 3 to 5 times. Any initial stiffness and discomfort should lessen with repetition over time.  Shoulder-Lifts. Lie face down with arms beside your body. Keep hips and torso pressed to floor as you slowly lift your head and shoulders off the floor. Do not overdo your exercises,  especially in the beginning. Exercises may cause you some mild back discomfort which lasts for a few minutes; however, if the pain is more severe, or lasts for more than 15 minutes, do not continue exercises until you see your caregiver. Improvement with exercise therapy for back problems is slow.  See your caregivers for assistance with developing a proper back exercise program. Document Released: 04/01/2004 Document Revised: 05/17/2011 Document Reviewed: 12/24/2010 [facility] Patient Information 2015 [place], [place]. This information is not intended to replace advice given to you by your health care provider. Make sure you discuss any questions you have with your health care provider.

## 2014-09-18 NOTE — ED Notes (Signed)
Pt reports acute pain started on Monday 09-16-14. Pt also reports a ^ year HX of back pain.

## 2014-09-18 NOTE — ED Provider Notes (Signed)
CSN: [id number]     Arrival date & time 09/18/14  0754 History   First MD Initiated Contact with Patient 09/18/14 [phone]     Chief Complaint  Patient presents with  . Back Pain     (Consider location/radiation/quality/duration/timing/severity/associated sxs/prior Treatment) HPI [name] is a 54 y.o. female with history of hypertension, back pain, A. fib, presents to emergency department complaining of back pain. Patient states back pain started when she woke up 2 days ago. Pain is mainly in the left flank but radiates all over. She denies any urinary symptoms. No fever or chills. Pain is worsened with movement. She had leftover Vicodin and 2 mg Valium tablets from 2 years ago and she took that last night with no improvement of her pain. She denies pain radiating down her extremities. She denies any numbness or weakness in extremities. She denies any abdominal pain. She did not take any medications today.  Past Medical History  Diagnosis Date  . Atrial fibrillation   . Hypertension   . Back pain   . Seizures    Past Surgical History  Procedure Laterality Date  . Cesarean section     Family History  Problem Relation Age of Onset  . Seizures Sister   . Seizures Brother    History  Substance Use Topics  . Smoking status: Current Every Day Smoker    Types: Cigarettes  . Smokeless tobacco: Not on file  . Alcohol Use: 0.0 oz/week    0 Standard drinks or equivalent per week     Comment: ocassionally   OB History    No data available     Review of Systems  Constitutional: Negative for fever and chills.  Respiratory: Negative for cough, chest tightness and shortness of breath.   Cardiovascular: Negative for chest pain, palpitations and leg swelling.  Gastrointestinal: Negative for nausea, vomiting, abdominal pain and diarrhea.  Genitourinary: Negative for dysuria, flank pain, vaginal bleeding, vaginal discharge, vaginal pain and pelvic pain.  Musculoskeletal: Positive for  back pain. Negative for myalgias, arthralgias, neck pain and neck stiffness.  Skin: Negative for rash.  Neurological: Negative for dizziness, weakness and headaches.  All other systems reviewed and are negative.     Allergies  Penicillins  Home Medications   Prior to Admission medications   Medication Sig Start Date End Date Taking? Authorizing Provider  ALPRAZolam Prudy Feeler(XANAX) 0.5 MG tablet Take 0.5 mg by mouth at bedtime as needed.    Historical Provider, MD  amLODipine (NORVASC) 5 MG tablet Take 1 tablet (5 mg total) by mouth daily. 04/30/14   Dwana MelenaBryan W Hager, PA-C  aspirin 325 MG tablet Take 325 mg by mouth daily.    Historical Provider, MD  carvedilol (COREG) 12.5 MG tablet TAKE ONE TABLET BY MOUTH TWICE DAILY WITH MEALS 04/30/14   Dwana MelenaBryan W Hager, PA-C  diazepam (VALIUM) 5 MG tablet Take 1 tablet (5 mg total) by mouth every 8 (eight) hours as needed (muscle spasm). 01/21/13   Raeford RazorStephen Kohut, MD  diazepam (VALIUM) 5 MG tablet Take 1 tablet (5 mg total) by mouth every 8 (eight) hours as needed for muscle spasms. 09/18/14   [name] Hefel, PA-C  ibuprofen (ADVIL,MOTRIN) 800 MG tablet Take 1 tablet (800 mg total) by mouth 3 (three) times daily. 12/12/12   Elson AreasLeslie K Sofia, PA-C  levETIRAcetam (KEPPRA) 500 MG tablet Take 1 tablet (500 mg total) by mouth 2 (two) times daily. 05/22/14   Suanne MarkerVikram R Penumalli, MD  losartan-hydrochlorothiazide (HYZAAR) 100-25 MG per tablet  TAKE ONE TABLET BY MOUTH ONCE DAILY 04/30/14   Dwana Melena, PA-C  Multiple Vitamins-Minerals (MULTIVITAMIN WITH MINERALS) tablet Take 1 tablet by mouth daily.    Historical Provider, MD  naproxen (NAPROSYN) 375 MG tablet Take 1 tablet (375 mg total) by mouth 2 (two) times daily as needed. 01/21/13   Raeford Razor, MD  naproxen (NAPROSYN) 500 MG tablet Take 1 tablet (500 mg total) by mouth 2 (two) times daily. 09/18/14   [name] Seyller, PA-C  oxyCODONE-acetaminophen (PERCOCET) 5-325 MG per tablet Take 1 tablet by mouth every 4 (four)  hours as needed for severe pain. 09/18/14   [name] Fleece, PA-C   BP 162/98 mmHg  Pulse 73  Temp(Src) 98.4 F (36.9 C) (Oral)  Resp 16  Ht 5' 4.5" (1.638 m)  Wt 150 lb (68.04 kg)  BMI 25.36 kg/m2  SpO2 100% Physical Exam  Constitutional: She appears well-developed and well-nourished. No distress.  HENT:  Head: Normocephalic.  Eyes: Conjunctivae are normal.  Neck: Neck supple.  Cardiovascular: Normal rate, regular rhythm and normal heart sounds.   Pulmonary/Chest: Effort normal and breath sounds normal. No respiratory distress. She has no wheezes. She has no rales.  Abdominal: Soft. Bowel sounds are normal. She exhibits no distension. There is no tenderness. There is no rebound.  Left CVA tenderness  Musculoskeletal: She exhibits no edema.  No midline thoracic or lumbar spine tenderness. Diffuse tenderness over paravertebral muscles over lumbar spine. No pain with Bilateral straight leg raise. Full range of motion of upper and lower extremities.  Neurological: She is alert.  5/5 and equal lower extremity strength. 2+ and equal patellar reflexes bilaterally. Pt able to dorsiflex bilateral toes and feet with good strength against resistance. Equal sensation bilaterally over thighs and lower legs.   Skin: Skin is warm and dry.  Psychiatric: She has a normal mood and affect. Her behavior is normal.  Nursing note and vitals reviewed.   ED Course  Procedures (including critical care time) Labs Review Labs Reviewed  URINALYSIS, ROUTINE W REFLEX MICROSCOPIC (NOT AT [facility]) - Abnormal; Notable for the following:    APPearance HAZY (*)    All other components within normal limits    Imaging Review No results found.   EKG Interpretation None      MDM   Final diagnoses:  Bilateral low back pain without sciatica    patient complaining of back pain for the last 2 days. Pain is mainly over the paralumbar spinal muscles. No midline tenderness. She is neurovascularly intact. No  injuries. Most likely muscle spasms. Will treat with Valium, Percocet, naproxen. Patient is ambulatory, in fact she drove herself here. No evidence of cauda equina. Will follow with primary care doctor. Return precautions discussed.  Filed Vitals:   09/18/14 0803  BP: 162/98  Pulse: 73  Temp: 98.4 F (36.9 C)  TempSrc: Oral  Resp: 16  Height: 5' 4.5" (1.638 m)  Weight: 150 lb (68.04 kg)  SpO2: 100%     Jaynie Crumble, PA-C 09/18/14 [id number]  Purvis Sheffield, MD 09/19/14 [phone]

## 2014-09-18 NOTE — ED Notes (Signed)
PT took  of valium at 0600 today

## 2015-04-05 ENCOUNTER — Ambulatory Visit (INDEPENDENT_AMBULATORY_CARE_PROVIDER_SITE_OTHER): Payer: Self-pay | Admitting: Physician Assistant

## 2015-04-05 VITALS — BP 140/80 | HR 84 | Temp 98.8°F | Resp 20 | Ht 64.0 in | Wt 153.4 lb

## 2015-04-05 DIAGNOSIS — N898 Other specified noninflammatory disorders of vagina: Secondary | ICD-10-CM

## 2015-04-05 LAB — POCT WET + KOH PREP
Trich by wet prep: ABSENT
Yeast by KOH: ABSENT
Yeast by wet prep: ABSENT

## 2015-04-05 NOTE — Patient Instructions (Signed)
I will contact you with your lab results as soon as they are available.   If you have not heard from me in 2 weeks, please contact me.  The fastest way to get your results is to register for My Chart (see the instructions on the last page of this printout).   

## 2015-04-05 NOTE — Progress Notes (Signed)
Subjective:   Patient ID: [name], female     DOB: Nov 06, 1960, 55 y.o.    MRN: [id number]  PCP: Alva Garnet., MD  Chief Complaint  Patient presents with  . Vaginal Discharge    HPI  Presents for evaluation of vaginal discharge x 1 week.  Initially had itching as well. Self-treated with OTC yeast product which resolved the itching, but discharge persists.  Monogamous sex since 2003. Remote history of gonorrhea that required hospitalization. No nausea, vomiting, diarrhea. No fever or chills. No swollen glands.    S/p BTL. Also post-menopausal.  Prior to Admission medications   Medication Sig Start Date End Date Taking? Authorizing Provider  ALPRAZolam Prudy Feeler) 0.5 MG tablet Take 0.5 mg by mouth at bedtime as needed.   Yes Historical Provider, MD  amLODipine (NORVASC) 5 MG tablet Take 1 tablet (5 mg total) by mouth daily. 04/30/14  Yes Dwana Melena, PA-C  aspirin 325 MG tablet Take 325 mg by mouth daily.   Yes Historical Provider, MD  carvedilol (COREG) 12.5 MG tablet TAKE ONE TABLET BY MOUTH TWICE DAILY WITH MEALS 04/30/14  Yes Kelle Darting Hager, PA-C  diazepam (VALIUM) 5 MG tablet Take 1 tablet (5 mg total) by mouth every 8 (eight) hours as needed for muscle spasms. 09/18/14  Yes Tatyana Kirichenko, PA-C  levETIRAcetam (KEPPRA) 500 MG tablet Take 1 tablet (500 mg total) by mouth 2 (two) times daily. 05/22/14  Yes Suanne Marker, MD  losartan-hydrochlorothiazide (HYZAAR) 100-25 MG per tablet TAKE ONE TABLET BY MOUTH ONCE DAILY 04/30/14  Yes Dwana Melena, PA-C  Multiple Vitamins-Minerals (MULTIVITAMIN WITH MINERALS) tablet Take 1 tablet by mouth daily.   Yes Historical Provider, MD  oxyCODONE-acetaminophen (PERCOCET) 5-325 MG per tablet Take 1 tablet by mouth every 4 (four) hours as needed for severe pain. 09/18/14  Yes Tatyana Kirichenko, PA-C  ibuprofen (ADVIL,MOTRIN) 800 MG tablet Take 1 tablet (800 mg total) by mouth 3 (three) times daily. Patient not taking:  Reported on 04/05/2015 12/12/12   Elson Areas, PA-C     Allergies  Allergen Reactions  . Penicillins      Patient Active Problem List   Diagnosis Date Noted  . Seizure disorder (HCC) 05/22/2013  . SICKLE CELL TRAIT 11/05/2008  . SYNCOPE, HX OF 11/05/2008  . ATRIAL FIBRILLATION, HX OF 11/05/2008  . FIBROCYSTIC BREAST DISEASE, HX OF 11/05/2008  . ANEMIA, OTHER, UNSPECIFIED 05/05/2006  . TOBACCO DEPENDENCE 05/05/2006  . TENSION HEADACHE 05/05/2006  . HYPERTENSION, BENIGN SYSTEMIC 05/05/2006  . GASTROESOPHAGEAL REFLUX, NO ESOPHAGITIS 05/05/2006     Family History  Problem Relation Age of Onset  . Sickle cell trait Sister   . Hypertension Brother   . Sickle cell anemia Mother   . Hypertension Mother   . Seizures Sister   . Sickle cell trait Sister   . Hypertension Brother   . Hypertension Brother      Social History   Social History  . Marital Status: Single    Spouse Name: Minerva Areola  . Number of Children: 2  . Years of Education: College   Occupational History  . health care billing specialist Other    [employer]   Social History Main Topics  . Smoking status: Current Every Day Smoker    Types: Cigarettes  . Smokeless tobacco: Never Used  . Alcohol Use: 0.0 oz/week    0 Standard drinks or equivalent per week     Comment: ocassionally  . Drug Use: No  . Sexual Activity:  Partners: Male    Birth Control/ Protection: Post-menopausal, Surgical     Comment: one partner since 2003   Other Topics Concern  . Not on file   Social History Narrative   Patient lives with her daughter.   Her son lives in [place], independently.   Caffeine Use: 2-3 cups daily Coffee        Review of Systems  Constitutional: Negative.   Gastrointestinal: Negative for nausea, vomiting and diarrhea.  Genitourinary: Positive for vaginal discharge. Negative for dysuria, urgency, frequency, pelvic pain and dyspareunia.  Hematological: Negative for adenopathy.           Objective:  Physical Exam  Constitutional: She is oriented to person, place, and time. She appears well-developed and well-nourished. She is active and cooperative. No distress.  BP 140/80 mmHg  Pulse 84  Temp(Src) 98.8 F (37.1 C) (Oral)  Resp 20  Ht  (1.626 m)  Wt 153 lb 6.4 oz (69.582 kg)  BMI 26.32 kg/m2  SpO2 95%   Eyes: Conjunctivae are normal.  Cardiovascular: Normal rate, regular rhythm and normal heart sounds.   Pulmonary/Chest: Effort normal and breath sounds normal.  Abdominal: Hernia confirmed negative in the right inguinal area and confirmed negative in the left inguinal area.  Genitourinary: Uterus normal. Pelvic exam was performed with patient supine. No labial fusion. There is no rash, tenderness, lesion or injury on the right labia. There is no rash, tenderness, lesion or injury on the left labia. Cervix exhibits no motion tenderness, no discharge and no friability. Right adnexum displays no mass, no tenderness and no fullness. Left adnexum displays no mass, no tenderness and no fullness. No erythema, tenderness or bleeding in the vagina. No foreign body around the vagina. No signs of injury around the vagina. Vaginal discharge found.  Lymphadenopathy:       Right: No inguinal adenopathy present.       Left: No inguinal adenopathy present.  Neurological: She is alert and oriented to person, place, and time.  Psychiatric: She has a normal mood and affect. Her speech is normal and behavior is normal.     Results for orders placed or performed in visit on 04/05/15  POCT Wet + KOH Prep  Result Value Ref Range   Yeast by KOH Absent Present, Absent   Yeast by wet prep Absent Present, Absent   WBC by wet prep Few None, Few, Too numerous to count   Clue Cells Wet Prep HPF POC None None, Too numerous to count   Trich by wet prep Absent Present, Absent   Bacteria Wet Prep HPF POC Few None, Few, Too numerous to count   Epithelial Cells By [employer] Pref ([facility]) Moderate (A)  None, Few, Too numerous to count   RBC,UR,HPF,POC None None RBC/hpf           Assessment & Plan:  1. Vaginal discharge Likely had yeast, treated with OTC product. Await GC/CT result. Expect it will be normal. If D/C persists at that time, would send in diflucan, as the OTC product may not have completely treated the infection. - POCT Wet + KOH Prep - GC/Chlamydia Probe Amp   Fernande Bras, PA-C Physician Assistant-Certified [facility]

## 2015-04-07 LAB — GC/CHLAMYDIA PROBE AMP
CT PROBE, AMP APTIMA: NOT DETECTED
GC PROBE AMP APTIMA: NOT DETECTED

## 2015-04-25 ENCOUNTER — Other Ambulatory Visit: Payer: Self-pay | Admitting: Physician Assistant

## 2015-04-25 NOTE — Telephone Encounter (Signed)
Rx refill sent to pharmacy. 

## 2015-05-19 ENCOUNTER — Other Ambulatory Visit: Payer: Self-pay | Admitting: Physician Assistant

## 2015-06-08 ENCOUNTER — Other Ambulatory Visit: Payer: Self-pay | Admitting: Cardiovascular Disease

## 2015-06-08 ENCOUNTER — Other Ambulatory Visit: Payer: Self-pay | Admitting: Physician Assistant

## 2015-06-11 ENCOUNTER — Other Ambulatory Visit: Payer: Self-pay | Admitting: Physician Assistant

## 2015-06-11 MED ORDER — CARVEDILOL 12.5 MG PO TABS: ORAL_TABLET | ORAL | Status: DC

## 2015-06-11 MED ORDER — LOSARTAN POTASSIUM-HCTZ 100-25 MG PO TABS: 1.0000 | ORAL_TABLET | Freq: Two times a day (BID) | ORAL | Status: DC

## 2015-06-22 NOTE — Progress Notes (Signed)
Cardiology Office Note:    Date:  06/23/2015   ID:  [name], DOB 22-Jun-1960, MRN [id number]  PCP:  Alva Garnet., MD  Cardiologist:  Dr. Tonny Bollman   Electrophysiologist:  N/a Neurologist: Dr. Marjory Lies  Referring MD: Andi Devon, MD   Chief Complaint  Patient presents with  . Atrial Fibrillation    follow up    History of Present Illness:     [name] is a 55 y.o. female with a hx of PAF, HTN, seizure d/o, tobacco abuse.  CHADS2-VASc=2 (HTN, female).  But, she has been maintained on ASA alone for years with no apparent recurrence of AFib.  Last seen 2/16.     Returns for FU.  She admits to increased stress at work. Over the past month, she has noted increasing palpitations. They're described as a flutter and last just a minute or less. She has 1-2 a week. She denies chest pain, shortness of breath, syncope, orthopnea, PND or edema. She does admit to significant fatigue. Denies any significant snoring or apnea. She denies any bleeding issues.   Past Medical History  Diagnosis Date  . Atrial fibrillation (HCC)   . Hypertension   . Back pain   . Seizures [facility])     Past Surgical History  Procedure Laterality Date  . Cesarean section      Current Medications: Outpatient Prescriptions Prior to Visit  Medication Sig Dispense Refill  . ALPRAZolam (XANAX) 0.5 MG tablet Take 0.5 mg by mouth at bedtime as needed.    Marland Kitchen amLODipine (NORVASC) 5 MG tablet Take 1 tablet (5 mg total) by mouth daily. 30 tablet 0  . aspirin 325 MG tablet Take 325 mg by mouth daily.    . carvedilol (COREG) 12.5 MG tablet TAKE ONE TABLET BY MOUTH TWICE DAILY WITH MEALS 60 tablet 0  . diazepam (VALIUM) 5 MG tablet Take 1 tablet (5 mg total) by mouth every 8 (eight) hours as needed for muscle spasms. 15 tablet 0  . ibuprofen (ADVIL,MOTRIN) 800 MG tablet Take 1 tablet (800 mg total) by mouth 3 (three) times daily. 21 tablet 0  . levETIRAcetam (KEPPRA) 500 MG tablet Take 1 tablet  (500 mg total) by mouth 2 (two) times daily. 180 tablet 4  . losartan-hydrochlorothiazide (HYZAAR) 100-25 MG tablet Take 1 tablet by mouth 2 (two) times daily. 60 tablet 0  . Multiple Vitamins-Minerals (MULTIVITAMIN WITH MINERALS) tablet Take 1 tablet by mouth daily.    Marland Kitchen oxyCODONE-acetaminophen (PERCOCET) 5-325 MG per tablet Take 1 tablet by mouth every 4 (four) hours as needed for severe pain. 20 tablet 0  . amLODipine (NORVASC) 5 MG tablet TAKE ONE TABLET BY MOUTH ONCE DAILY **PLEASE SCHEDULE APPOINTMENT FOR REFILLS** 15 tablet 0   No facility-administered medications prior to visit.     Allergies:   Penicillins   Social History   Social History  . Marital Status: Single    Spouse Name: Minerva Areola  . Number of Children: 2  . Years of Education: College   Occupational History  . health care billing specialist Other    [employer]   Social History Main Topics  . Smoking status: Current Every Day Smoker    Types: Cigarettes  . Smokeless tobacco: Never Used  . Alcohol Use: 0.0 oz/week    0 Standard drinks or equivalent per week     Comment: ocassionally  . Drug Use: No  . Sexual Activity:    Partners: Male    Birth Control/ Protection: Post-menopausal, Surgical  Comment: one partner since 2003   Other Topics Concern  . None   Social History Narrative   Patient lives with her daughter.   Her son lives in [place], independently.   Caffeine Use: 2-3 cups daily Coffee     Family History:  The patient's family history includes Hypertension in her brother, brother, brother, and mother; Seizures in her sister; Sickle cell anemia in her mother; Sickle cell trait in her sister and sister.   ROS:   Please see the history of present illness.    Review of Systems  Constitution: Positive for malaise/fatigue.  Cardiovascular: Positive for irregular heartbeat.  Musculoskeletal: Positive for myalgias.  Psychiatric/Behavioral: The patient is nervous/anxious.    All other systems  reviewed and are negative.   Physical Exam:    VS:  BP 130/86 mmHg  Pulse 70  Ht  (1.626 m)  Wt 154 lb 1.9 oz (69.908 kg)  BMI 26.44 kg/m2   GEN: Well nourished, well developed, in no acute distress HEENT: normal Neck: no JVD, no masses Cardiac: Normal S1/S2, RRR; no murmurs, rubs, or gallops, no edema;     Respiratory:  clear to auscultation bilaterally; no wheezing, rhonchi or rales GI: soft, nontender, nondistended MS: no deformity or atrophy Skin: warm and dry Neuro: No focal deficits  Psych: Alert and oriented x 3, normal affect  Wt Readings from Last 3 Encounters:  06/23/15 154 lb 1.9 oz (69.908 kg)  04/05/15 153 lb 6.4 oz (69.582 kg)  09/18/14 150 lb (68.04 kg)      Studies/Labs Reviewed:     EKG:  EKG is   ordered today.  The ekg ordered today demonstrates NSR, HR 70, normal axis, QTc 451 ms, no change from prior tracings  Recent Labs: No results found for requested labs within last 365 days.   Recent Lipid Panel No results found for: CHOL, TRIG, HDL, CHOLHDL, VLDL, LDLCALC, LDLDIRECT  Additional studies/ records that were reviewed today include:    Echo 3/09 EF 65%, no RWMA, mod LVH, trivial AI.   ASSESSMENT:     1. Palpitations   2. PAF (paroxysmal atrial fibrillation) (HCC)   3. Essential hypertension, benign   4. Other fatigue   5. Tobacco abuse   6. Anxiety     PLAN:     In order of problems listed above:  1. Palpitations - Overall, these sound consistent with premature beats. However, she does have a history of atrial fibrillation. She would require long-term anticoagulation if atrial fibrillation is again identified. We discussed the possibility of proceeding with an event monitor. She is currently not interested in this.  -  Check BMET, CBC, magnesium, TSH  -  Decrease caffeine intake  -  Obtain echocardiogram  -  Call if palpitations continue. Arrange event monitor at that time.  2. PAF - Currently in NSR.  CHADS2-VASc=2 (HTN,  female).  She has been maintained on ASA only for years without recurrence.  With hx of seizures, anticoagulation has previously been felt to be less than optimal. Now having palpitations that sound c/w PAC/PVCs.  She is not interested in getting a monitor.  She will call to arrange a monitor if symptoms continue.  Check TSH as noted.  Obtain echo.   3. HTN - Controlled.   4. Fatigue - Obtain echo, CBC, TSH, BMET as noted.  Otherwise FU with PCP.    5. Tobacco abuse - I counseled her for 5 minutes on tobacco cessation.    6.  Anxiety - She asked me for medication to help with anxiety. I asked her to d/w her neurologist or PCP.    Medication Adjustments/Labs and Tests Ordered: Current medicines are reviewed at length with the patient today.  Concerns regarding medicines are outlined above.  Medication changes, Labs and Tests ordered today are outlined in the Patient Instructions noted below. Patient Instructions  Medication Instructions:  No changes.  See your medication list. Labwork: Today - BMET, Magnesium, CBC, TSH Testing/Procedures: 1. Your physician has requested that you have an echocardiogram. Echocardiography is a painless test that uses sound waves to create images of your heart. It provides your doctor with information about the size and shape of your heart and how well your heart's chambers and valves are working. This procedure takes approximately one hour. There are no restrictions for this procedure. Follow-Up: Dr. Tonny BollmanMichael Cooper in 1 year Any Other Special Instructions Will Be Listed Below (If Applicable). 1. Cut back on caffeine as much as possible. 2. Work on quitting smoking. Set a quit date.  You can use Nicotine patches to help.  DO NOT SMOKE while wearing nicotine patch. 3. Call your Neurologist or primary care doctor about medication for stress or anxiety. 4. Call if your palpitations worsen and I will set you up for an event monitor.  If you need a refill on your  cardiac medications before your next appointment, please call your pharmacy.    Signed, Tereso NewcomerScott Chaysen Tillman, PA-C  06/23/2015 12:27 PM    [facility] [address], [place], [place]  [address] Phone: [phone]; Fax: [phone]

## 2015-06-23 ENCOUNTER — Encounter: Payer: Self-pay | Admitting: Physician Assistant

## 2015-06-23 ENCOUNTER — Ambulatory Visit (INDEPENDENT_AMBULATORY_CARE_PROVIDER_SITE_OTHER): Payer: Self-pay | Admitting: Physician Assistant

## 2015-06-23 VITALS — BP 130/86 | HR 70 | Ht 64.0 in | Wt 154.1 lb

## 2015-06-23 DIAGNOSIS — I10 Essential (primary) hypertension: Secondary | ICD-10-CM

## 2015-06-23 DIAGNOSIS — R002 Palpitations: Secondary | ICD-10-CM

## 2015-06-23 DIAGNOSIS — R5383 Other fatigue: Secondary | ICD-10-CM

## 2015-06-23 DIAGNOSIS — I480 Paroxysmal atrial fibrillation: Secondary | ICD-10-CM

## 2015-06-23 DIAGNOSIS — F419 Anxiety disorder, unspecified: Secondary | ICD-10-CM

## 2015-06-23 DIAGNOSIS — Z720 Tobacco use: Secondary | ICD-10-CM

## 2015-06-23 LAB — BASIC METABOLIC PANEL
BUN: 15 mg/dL (ref 7–25)
CALCIUM: 9.7 mg/dL (ref 8.6–10.4)
CHLORIDE: 103 mmol/L (ref 98–110)
CO2: 28 mmol/L (ref 20–31)
CREATININE: 0.83 mg/dL (ref 0.50–1.05)
Glucose, Bld: 111 mg/dL — ABNORMAL HIGH (ref 65–99)
Potassium: 3.9 mmol/L (ref 3.5–5.3)
Sodium: 140 mmol/L (ref 135–146)

## 2015-06-23 LAB — CBC WITH DIFFERENTIAL/PLATELET
BASOS ABS: 0 {cells}/uL (ref 0–200)
Basophils Relative: 0 %
EOS ABS: 182 {cells}/uL (ref 15–500)
Eosinophils Relative: 2 %
HEMATOCRIT: 39.9 % (ref 35.0–45.0)
HEMOGLOBIN: 13.1 g/dL (ref 11.7–15.5)
LYMPHS ABS: 2275 {cells}/uL (ref 850–3900)
LYMPHS PCT: 25 %
MCH: 28.2 pg (ref 27.0–33.0)
MCHC: 32.8 g/dL (ref 32.0–36.0)
MCV: 85.8 fL (ref 80.0–100.0)
MONO ABS: 637 {cells}/uL (ref 200–950)
MPV: 10.7 fL (ref 7.5–12.5)
Monocytes Relative: 7 %
NEUTROS ABS: 6006 {cells}/uL (ref 1500–7800)
NEUTROS PCT: 66 %
Platelets: 242 10*3/uL (ref 140–400)
RBC: 4.65 MIL/uL (ref 3.80–5.10)
RDW: 13.2 % (ref 11.0–15.0)
WBC: 9.1 10*3/uL (ref 3.8–10.8)

## 2015-06-23 LAB — MAGNESIUM: Magnesium: 2.4 mg/dL (ref 1.5–2.5)

## 2015-06-23 LAB — TSH: TSH: 0.82 m[IU]/L

## 2015-06-23 NOTE — Patient Instructions (Addendum)
Medication Instructions:  No changes.  See your medication list. Labwork: Today - BMET, Magnesium, CBC, TSH Testing/Procedures: 1. Your physician has requested that you have an echocardiogram. Echocardiography is a painless test that uses sound waves to create images of your heart. It provides your doctor with information about the size and shape of your heart and how well your heart's chambers and valves are working. This procedure takes approximately one hour. There are no restrictions for this procedure. Follow-Up: Dr. Tonny BollmanMichael Cooper in 1 year Any Other Special Instructions Will Be Listed Below (If Applicable). 1. Cut back on caffeine as much as possible. 2. Work on quitting smoking. Set a quit date.  You can use Nicotine patches to help.  DO NOT SMOKE while wearing nicotine patch. 3. Call your Neurologist or primary care doctor about medication for stress or anxiety. 4. Call if your palpitations worsen and I will set you up for an event monitor.  If you need a refill on your cardiac medications before your next appointment, please call your pharmacy.

## 2015-06-24 ENCOUNTER — Telehealth: Payer: Self-pay | Admitting: *Deleted

## 2015-06-24 NOTE — Telephone Encounter (Signed)
Lmtcb for lab results 

## 2015-06-24 NOTE — Telephone Encounter (Signed)
Pt notified of lab results by phone with verbal understanding.  

## 2015-07-09 ENCOUNTER — Other Ambulatory Visit (HOSPITAL_COMMUNITY): Payer: Self-pay

## 2015-07-11 ENCOUNTER — Other Ambulatory Visit: Payer: Self-pay

## 2015-07-11 ENCOUNTER — Ambulatory Visit (HOSPITAL_COMMUNITY): Payer: Self-pay | Attending: Cardiology

## 2015-07-11 ENCOUNTER — Other Ambulatory Visit (HOSPITAL_COMMUNITY): Payer: Self-pay

## 2015-07-11 DIAGNOSIS — I480 Paroxysmal atrial fibrillation: Secondary | ICD-10-CM | POA: Insufficient documentation

## 2015-07-11 DIAGNOSIS — R002 Palpitations: Secondary | ICD-10-CM | POA: Insufficient documentation

## 2015-07-11 DIAGNOSIS — R5383 Other fatigue: Secondary | ICD-10-CM | POA: Insufficient documentation

## 2015-07-11 DIAGNOSIS — I340 Nonrheumatic mitral (valve) insufficiency: Secondary | ICD-10-CM | POA: Insufficient documentation

## 2015-07-11 DIAGNOSIS — I071 Rheumatic tricuspid insufficiency: Secondary | ICD-10-CM | POA: Insufficient documentation

## 2015-07-11 DIAGNOSIS — Z720 Tobacco use: Secondary | ICD-10-CM | POA: Insufficient documentation

## 2015-07-11 DIAGNOSIS — I119 Hypertensive heart disease without heart failure: Secondary | ICD-10-CM | POA: Insufficient documentation

## 2015-07-11 DIAGNOSIS — I10 Essential (primary) hypertension: Secondary | ICD-10-CM

## 2015-07-13 ENCOUNTER — Other Ambulatory Visit: Payer: Self-pay | Admitting: Cardiovascular Disease

## 2015-07-13 ENCOUNTER — Encounter: Payer: Self-pay | Admitting: Physician Assistant

## 2015-07-13 ENCOUNTER — Other Ambulatory Visit: Payer: Self-pay | Admitting: Diagnostic Neuroimaging

## 2015-07-14 ENCOUNTER — Telehealth: Payer: Self-pay | Admitting: *Deleted

## 2015-07-14 NOTE — Telephone Encounter (Signed)
Mailbox full

## 2015-07-14 NOTE — Telephone Encounter (Signed)
Pt notified of echo results by phone with verbal understanding 

## 2015-07-14 NOTE — Telephone Encounter (Signed)
Attempted to reach patient to schedule annual follow up. No voice mail; unable to leave message.

## 2015-08-11 ENCOUNTER — Other Ambulatory Visit: Payer: Self-pay | Admitting: Cardiovascular Disease

## 2015-08-30 ENCOUNTER — Emergency Department (HOSPITAL_COMMUNITY)
Admission: EM | Admit: 2015-08-30 | Discharge: 2015-08-30 | Disposition: A | Discharge status: Home / Self Care | Payer: Self-pay | Attending: Emergency Medicine | Admitting: Emergency Medicine

## 2015-08-30 ENCOUNTER — Encounter (HOSPITAL_COMMUNITY): Payer: Self-pay

## 2015-08-30 DIAGNOSIS — I10 Essential (primary) hypertension: Secondary | ICD-10-CM | POA: Insufficient documentation

## 2015-08-30 DIAGNOSIS — F17210 Nicotine dependence, cigarettes, uncomplicated: Secondary | ICD-10-CM | POA: Insufficient documentation

## 2015-08-30 DIAGNOSIS — L259 Unspecified contact dermatitis, unspecified cause: Secondary | ICD-10-CM | POA: Insufficient documentation

## 2015-08-30 DIAGNOSIS — Z7982 Long term (current) use of aspirin: Secondary | ICD-10-CM | POA: Insufficient documentation

## 2015-08-30 MED ORDER — BETAMETHASONE DIPROPIONATE 0.05 % EX OINT: TOPICAL_OINTMENT | Freq: Two times a day (BID) | CUTANEOUS | Status: DC

## 2015-08-30 NOTE — ED Provider Notes (Signed)
CSN: [id number]     Arrival date & time 08/30/15  1209 History  By signing my name below, I, [name], attest that this documentation has been prepared under the direction and in the presence of Audry Piliyler Jakya Dovidio, PA-C.  Electronically Signed: Tanda RockersMargaux [name], ED Scribe. 08/30/2015. 1:19 PM.   Chief Complaint  Patient presents with  . Rash   The history is provided by the patient. No language interpreter was used.     HPI Comments: [name] LevinSharon [name] is a 55 y.o. female who presents to the Emergency Department complaining of gradual onset, constant, pruritic and painful red rash to extremities and torso x 1 week. The rash began on the abdomen and has since spread. The pain is a 6/10 on the pain scale. She has been taking Benadryl and Calamine lotion with mild relief. Pt has never had similar symptoms in the past. No new clothes, deodorants, fabric softnerner, detergents, foods, or medications. No recent bug bites or exposure to the outdoors. She mentions having intermittent nausea and diarrhea since the rash began with abdominal pain. Denies throat swelling, shortness of breath, fever, vomiting, sore throat, or any other associated symptoms. No hx liver or kidney issues.   Past Medical History  Diagnosis Date  . Atrial fibrillation (HCC)   . Hypertension   . Back pain   . Seizures (HCC)   . History of echocardiogram     Echo 5/17 - mild concentric LVH, vigorous LVEF, EF 65-70%, no RWMA, Gr 1 DD, normal RVEF, normal bilat atrial size   Past Surgical History  Procedure Laterality Date  . Cesarean section     Family History  Problem Relation Age of Onset  . Sickle cell trait Sister   . Hypertension Brother   . Sickle cell anemia Mother   . Hypertension Mother   . Seizures Sister   . Sickle cell trait Sister   . Hypertension Brother   . Hypertension Brother    Social History  Substance Use Topics  . Smoking status: Current Every Day Smoker    Types: Cigarettes  . Smokeless tobacco:  Never Used  . Alcohol Use: 0.0 oz/week    0 Standard drinks or equivalent per week     Comment: ocassionally   OB History    No data available     Review of Systems  Constitutional: Negative for fever.  HENT: Negative for sore throat.   Respiratory: Negative for shortness of breath.   Gastrointestinal: Positive for nausea, abdominal pain and diarrhea. Negative for vomiting.  Musculoskeletal: Positive for myalgias.  Skin: Positive for rash.   Allergies  Penicillins  Home Medications   Prior to Admission medications   Medication Sig Start Date End Date Taking? Authorizing Provider  ALPRAZolam Prudy Feeler(XANAX) 0.5 MG tablet Take 0.5 mg by mouth at bedtime as needed.    Historical Provider, MD  amLODipine (NORVASC) 5 MG tablet TAKE ONE TABLET BY MOUTH ONCE DAILY 07/14/15   Tonny BollmanMichael Cooper, MD  aspirin 325 MG tablet Take 325 mg by mouth daily.    Historical Provider, MD  carvedilol (COREG) 12.5 MG tablet TAKE ONE TABLET BY MOUTH TWICE DAILY WITH MEALS 07/14/15   Tonny BollmanMichael Cooper, MD  diazepam (VALIUM) 5 MG tablet Take 1 tablet (5 mg total) by mouth every 8 (eight) hours as needed for muscle spasms. 09/18/14   Tatyana Kirichenko, PA-C  ibuprofen (ADVIL,MOTRIN) 800 MG tablet Take 1 tablet (800 mg total) by mouth 3 (three) times daily. 12/12/12   Elson AreasLeslie K Sofia, PA-C  levETIRAcetam (KEPPRA) 500 MG tablet TAKE ONE TABLET BY MOUTH TWICE DAILY 07/14/15   Suanne MarkerVikram R Penumalli, MD  losartan-hydrochlorothiazide (HYZAAR) 100-25 MG tablet TAKE ONE TABLET BY MOUTH TWICE DAILY 08/11/15   Tonny BollmanMichael Cooper, MD  Multiple Vitamins-Minerals (MULTIVITAMIN WITH MINERALS) tablet Take 1 tablet by mouth daily.    Historical Provider, MD  oxyCODONE-acetaminophen (PERCOCET) 5-325 MG per tablet Take 1 tablet by mouth every 4 (four) hours as needed for severe pain. 09/18/14   Tatyana Kirichenko, PA-C   BP 145/97 mmHg  Pulse 83  Temp(Src) 98.7 F (37.1 C) (Oral)  Resp 20  SpO2 97%   Physical Exam  Constitutional: She is oriented to  person, place, and time. She appears well-developed and well-nourished. No distress.  HENT:  Head: Normocephalic and atraumatic.  Eyes: Conjunctivae and EOM are normal.  Neck: Neck supple. No tracheal deviation present.  Cardiovascular: Normal rate.   Pulmonary/Chest: Effort normal. No respiratory distress.  Abdominal: Normal appearance and bowel sounds are normal. There is no rigidity, no rebound, no guarding, no tenderness at McBurney's point and negative Murphy's sign.  Musculoskeletal: Normal range of motion.  Neurological: She is alert and oriented to person, place, and time.  Skin: Skin is warm and dry. Rash noted.  Scattered isolated lesions <1cm that are blanchable on palpation. Excoriations noted. Non infectious. Non purulent. Lesions located on trunk, BLE and BUE  Psychiatric: She has a normal mood and affect. Her behavior is normal.  Nursing note and vitals reviewed.  ED Course  Procedures (including critical care time)  DIAGNOSTIC STUDIES: Oxygen Saturation is 97% on RA, normal by my interpretation.    COORDINATION OF CARE: 1:16 PM-Discussed treatment plan which includes steroid cream and follow up with PCP with pt at bedside and pt agreed to plan.    MDM  I have reviewed the relevant previous healthcare records. I obtained HPI from historian.  ED Course:  Assessment: Patient with contact dermatitis. Instructed to avoid offending agent and to use unscented soaps, lotions, and detergents. Will treat with steroid cream.  No signs of secondary infection. Follow up with PCP in 2-3 days. Return precautions discussed. Pt is safe for discharge at this time.  Disposition/Plan:  DC Home Additional Verbal discharge instructions given and discussed with patient.  Pt Instructed to f/u with PCP in the next week for evaluation and treatment of symptoms. Return precautions given Pt acknowledges and agrees with plan  Supervising Physician Cathren LaineKevin Steinl, MD   Final diagnoses:   Contact dermatitis   I personally performed the services described in this documentation, which was scribed in my presence. The recorded information has been reviewed and is accurate.    Audry Piliyler Alexandru Moorer, PA-C 08/30/15 1325  Cathren LaineKevin Steinl, MD 08/30/15 1332

## 2015-08-30 NOTE — Discharge Instructions (Signed)
Please read and follow all provided instructions.  Your diagnoses today include:  1. Contact dermatitis    Tests performed today include:  Vital signs. See below for your results today.   Medications prescribed:   Take as prescribed   Home care instructions:  Follow any educational materials contained in this packet.  Wash and change sheets  Follow-up instructions: Please follow-up with your primary care provider for further evaluation of symptoms and treatment in 1 week.   Return instructions:   Please return to the Emergency Department if you do not get better, if you get worse, or new symptoms OR  - Fever (temperature greater than 101.64F)  - Bleeding that does not stop with holding pressure to the area    -Severe pain (please note that you may be more sore the day after your accident)  - Chest Pain  - Difficulty breathing  - Severe nausea or vomiting  - Inability to tolerate food and liquids  - Passing out  - Skin becoming red around your wounds  - Change in mental status (confusion or lethargy)  - New numbness or weakness     Please return if you have any other emergent concerns.  Additional Information:  Your vital signs today were: BP 145/97 mmHg   Pulse 83   Temp(Src) 98.7 F (37.1 C) (Oral)   Resp 20   SpO2 97% If your blood pressure (BP) was elevated above 135/85 this visit, please have this repeated by your doctor within one month. ---------------

## 2015-08-30 NOTE — ED Notes (Signed)
Patient complains of generalized rash with itching x 1 week, no relief with benadryl.

## 2015-09-03 ENCOUNTER — Other Ambulatory Visit: Payer: Self-pay | Admitting: Diagnostic Neuroimaging

## 2015-10-04 ENCOUNTER — Other Ambulatory Visit: Payer: Self-pay | Admitting: Diagnostic Neuroimaging

## 2015-10-06 NOTE — Telephone Encounter (Signed)
LVM requesting patient call back to schedule a follow up. Advised her Dr Marjory Lies refilled Keppra one month ago, and she was to call and schedule FU.  Left name, number for her to schedule.

## 2015-10-07 ENCOUNTER — Telehealth: Payer: Self-pay | Admitting: Diagnostic Neuroimaging

## 2015-10-07 NOTE — Telephone Encounter (Signed)
Refill for one month sent with note that patient must come in for FU on 11/11/15 for further refills.

## 2015-10-07 NOTE — Telephone Encounter (Signed)
Patient requesting refill of levETIRAcetam (KEPPRA) 500 MG tablet called to [facility] on [address]. The patient has an appointment with Dr. Marjory Lies on 11-11-15.

## 2015-10-07 NOTE — Telephone Encounter (Signed)
Patient requesting refill of  levETIRAcetam (KEPPRA) 500 MG tablet. She has an appointment scheduled for 11-11-15. Please call to [facility] on [facility] [address].

## 2015-11-11 ENCOUNTER — Encounter: Payer: Self-pay | Admitting: Diagnostic Neuroimaging

## 2015-11-11 ENCOUNTER — Ambulatory Visit (INDEPENDENT_AMBULATORY_CARE_PROVIDER_SITE_OTHER): Payer: Self-pay | Admitting: Diagnostic Neuroimaging

## 2015-11-11 VITALS — BP 129/74 | HR 70 | Ht 64.0 in | Wt 156.2 lb

## 2015-11-11 DIAGNOSIS — Z720 Tobacco use: Secondary | ICD-10-CM

## 2015-11-11 DIAGNOSIS — I480 Paroxysmal atrial fibrillation: Secondary | ICD-10-CM

## 2015-11-11 DIAGNOSIS — G40909 Epilepsy, unspecified, not intractable, without status epilepticus: Secondary | ICD-10-CM

## 2015-11-11 MED ORDER — LEVETIRACETAM 500 MG PO TABS: 500.0000 mg | ORAL_TABLET | Freq: Two times a day (BID) | ORAL | 4 refills | Status: DC

## 2015-11-11 NOTE — Progress Notes (Signed)
[facility]  PATIENT: [name] DOB: Dec 26, 1960  REFERRING CLINICIAN:  HISTORY FROM: patient  REASON FOR VISIT: follow up   HISTORICAL  CHIEF COMPLAINT:  Chief Complaint  Patient presents with  . Seizures    rm 6, "no seizure activity"  . Follow-up    last seen 05/2014    HISTORY OF PRESENT ILLNESS:   UPDATE 11/11/15: Since last visit, doing well. No sz. Patient may be moving to [place]. Otherwise doing well.   UPDATE 05/22/14: Since last visit, doing well. No seizures. Some intermittent eyelid and lip twitching. Int toe spasms. Overall doing well.   UPDATE 05/22/13: Doing well. No seizures since 11/11/11. Now on better schedule of LEV '500mg'$  BID, without missing doses. 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 This is challenging and has caused her increased stress. Also, her younger brother also diagnosed with seizure disorder now.  UPDATE 11/15/11:  Felt on Thurs (11/11/11) had feelings of having seizure; she felt "out of body".  She was in the car and she had someone to come and pick her up.  She was aware of the situation.  She has missed an average of 3 hs doses/wk of LEV '500mg'$ .  She has had increased stress with her mother's declining health.  Having difficulty with putting thoughts together and repeating words.  She is also having increased anxiety.    UPDATE 01/05/11: Doing well.  Tolerating meds.  Back to driving.  No seizure since Jun 24, 2009.  UPDATE 11/04/09: Doing well; no further seizures.  Having intermittent twitching in toes up to thighs; sometimes bilateral, sometimes unilateral; no triggers.  Still with short temper.  PRIOR HPI (07/22/09): 55 year old right-handed female with hypertension and atrial fibrillation presenting for evaluation of possible seizure disorder. The patient has had 3 episodes in her life of altered or loss of consciousness.  The first episode occurred in the  1990s when she was a mall and a "strange feeling" with pain over her. She felt as though she was about to pass out. She was able to sit down and did not lose consciousness. She was somewhat confused. Her brother came to pick her up and she did not seek medical attention. The second episode occurred in 2004 when she woke up in the middle of the night feeling very hot. She went to the kitchen and the next thing she remembers is on the floor with her daughter standing over her. She was evaluated in the hospital MRI of the brain revealed a right parietal periventricular focus of gliosis (which I reviewed and agree with report).  Followup MRIs demonstrated stability of this lesion. June 24, 2009 the patient was at the beauty salon, when she all of a sudden she began to profusely sweating. She felt as though she was about to go into the base. In PT she gave her a walker she was able to drink and she ended up going into a "stare" and that her eyes rolled back and she began to shake all over. Unclear what the duration of this event was. She did not get her tongue. He did not have urinary or bowel incontinence. Paramedics were called to the scene and reportedly she had a "weak pulse" (no documentation of this).  She was evaluated at [facility] emergency room with CT scan and MRI of the brain, which showed stability of the right parietal gliosis (I reviewed images and agree). I discussed case  with the emergency room clinicians in the evening patient was started on levetiracetam 500 mg BID. Since discharge she is had no further events. She does have some fatigue and mood swings with this medication. In retrospect patient has been having episodes of dj vu and out of body experience. She denies olfactory hallucinations. She has a family history of seizure (sister).  She denies any prior head trauma. Denies meningitis or encephalitis. She did well in school (honor roll).  She is not driving presently.   REVIEW OF SYSTEMS:  Full 14 system review of systems performed and negative except: joint pain joint swelling ringing in ears easy bruising.    ALLERGIES: Allergies  Allergen Reactions  . Penicillins Rash    Turn red all over    HOME MEDICATIONS: Outpatient Medications Prior to Visit  Medication Sig Dispense Refill  . ALPRAZolam (XANAX) 0.5 MG tablet Take 0.5 mg by mouth at bedtime as needed.    Marland Kitchen amLODipine (NORVASC) 5 MG tablet TAKE ONE TABLET BY MOUTH ONCE DAILY 30 tablet 10  . aspirin 325 MG tablet Take 325 mg by mouth daily.    . betamethasone dipropionate (DIPROLENE) 0.05 % ointment Apply topically 2 (two) times daily. 30 g 0  . carvedilol (COREG) 12.5 MG tablet TAKE ONE TABLET BY MOUTH TWICE DAILY WITH MEALS 60 tablet 10  . diazepam (VALIUM) 5 MG tablet Take 1 tablet (5 mg total) by mouth every 8 (eight) hours as needed for muscle spasms. 15 tablet 0  . ibuprofen (ADVIL,MOTRIN) 800 MG tablet Take 1 tablet (800 mg total) by mouth 3 (three) times daily. 21 tablet 0  . losartan-hydrochlorothiazide (HYZAAR) 100-25 MG tablet TAKE ONE TABLET BY MOUTH TWICE DAILY 60 tablet 9  . Multiple Vitamins-Minerals (MULTIVITAMIN WITH MINERALS) tablet Take 1 tablet by mouth daily.    Marland Kitchen oxyCODONE-acetaminophen (PERCOCET) 5-325 MG per tablet Take 1 tablet by mouth every 4 (four) hours as needed for severe pain. 20 tablet 0  . levETIRAcetam (KEPPRA) 500 MG tablet TAKE ONE TABLET BY MOUTH TWICE DAILY** NEEDS TO CALL AND SCHEDULE ANNUAL FOLLOW UP** 60 tablet 0   No facility-administered medications prior to visit.     PAST MEDICAL HISTORY: Past Medical History:  Diagnosis Date  . Atrial fibrillation ([place])   . Back pain   . History of echocardiogram    Echo 5/17 - mild concentric LVH, vigorous LVEF, EF 65-70%, no RWMA, Gr 1 DD, normal RVEF, normal bilat atrial size  . Hypertension   . Seizures ([place])    around 2014    PAST SURGICAL HISTORY: Past Surgical History:  Procedure Laterality Date  . CESAREAN SECTION        FAMILY HISTORY: Family History  Problem Relation Age of Onset  . Sickle cell trait Sister   . Hypertension Brother   . Sickle cell anemia Mother   . Hypertension Mother   . Seizures Sister   . Sickle cell trait Sister   . Hypertension Brother   . Hypertension Brother     SOCIAL HISTORY:  Social History   Social History  . Marital status: Single    Spouse name: Randall Hiss  . Number of children: 2  . Years of education: College   Occupational History  . health care billing specialist Other    [place] History Main Topics  . Smoking status: Current Every Day Smoker    Packs/day: 0.50    Types: Cigarettes  . Smokeless tobacco: Never Used  Comment: 11/11/15 trying to quit  . Alcohol use 0.0 oz/week     Comment: ocassionally  . Drug use: No  . Sexual activity: Yes    Partners: Male    Birth control/ protection: Post-menopausal, Surgical     Comment: one partner since 2003   Other Topics Concern  . Not on file   Social History Narrative   Patient lives with her daughter.   Her son lives in [place], independently.   Caffeine Use: 2-3 cups daily Coffee     PHYSICAL EXAM  Vitals:   11/11/15 1307  BP: 129/74  Pulse: 70  Weight: 156 lb 3.2 oz (70.9 kg)  Height: '5\' 4"'$  (1.626 m)    Not recorded      Body mass index is 26.81 kg/m.  GENERAL EXAM: Patient is in no distress; well developed, nourished and groomed; neck is supple  CARDIOVASCULAR: Regular rate and rhythm, no murmurs, no carotid bruits  NEUROLOGIC: MENTAL STATUS: awake, alert, language fluent, comprehension intact, naming intact, fund of knowledge appropriate CRANIAL NERVE: pupils equal and reactive to light, visual fields full to confrontation, extraocular muscles intact, no nystagmus, facial sensation and strength symmetric, hearing intact, palate elevates symmetrically, uvula midline, shoulder shrug symmetric, tongue midline. MOTOR: normal bulk and tone, full strength in the BUE,  BLE SENSORY: normal and symmetric to light touch COORDINATION: finger-nose-finger, fine finger movements normal REFLEXES: deep tendon reflexes present and symmetric GAIT/STATION: narrow based gait; romberg is negative    DIAGNOSTIC DATA (LABS, IMAGING, TESTING) - I reviewed patient records, labs, notes, testing and imaging myself where available.  Lab Results  Component Value Date   WBC 9.1 06/23/2015   HGB 13.1 06/23/2015   HCT 39.9 06/23/2015   MCV 85.8 06/23/2015   PLT 242 06/23/2015      Component Value Date/Time   NA 140 06/23/2015 1233   K 3.9 06/23/2015 1233   CL 103 06/23/2015 1233   CO2 28 06/23/2015 1233   GLUCOSE 111 (H) 06/23/2015 1233   BUN 15 06/23/2015 1233   CREATININE 0.83 06/23/2015 1233   CALCIUM 9.7 06/23/2015 1233   GFRNONAA 84.77 10/29/2009 0000   GFRAA  06/24/2009 1120    >60        The eGFR has been calculated using the MDRD equation. This calculation has not been validated in all clinical situations. eGFR's persistently <60 mL/min signify possible Chronic Kidney Disease.   No results found for: CHOL No results found for: HGBA1C No results found for: VITAMINB12 Lab Results  Component Value Date   TSH 0.82 06/23/2015    I reviewed images myself and agree with interpretation. -VRP  06/24/09 MRI brain - No acute abnormality. The right posterior temporal white matter  lesion is stable since 2005. This may represent chronic ischemia  or demyelinating disease. No other lesions are present.  08/01/09 EEG - normal    ASSESSMENT AND PLAN  55 y.o. year old female with hypertension and paroxysmal atrial fibrillation (CHA2DS2-VASc = 2 (HTN, female) with intermittent episodes of loss of consciousness and convulsions. Last event 11/11/11. Doing well on LEV '500mg'$  BID.   She has had one prior episode of loss of consciousness and another episode of altered consciousness.  She has some prodromal symptoms of syncope (sweating, lightheadedness, "weak  pulse") but other features of complex partial seizures (deja vu, out of body experience).  She had witnessed convulsions recently, no tongue biting or incontinence, and some post-ictal fatigue/confusion.  MRI brain also demonstrates a right parietal,  periventricular focus of gliosis, unclear etiology.   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 Burt Knack)  Seizure disorder [facility])  Tobacco abuse  PAF (paroxysmal atrial fibrillation) ([place])     PLAN: - continue levetiracetam '500mg'$  BID - we discussed seizure diagnosis, treatment, options for tapering in future, driving safety; for now continue current medications  Meds ordered this encounter  Medications  . levETIRAcetam (KEPPRA) 500 MG tablet    Sig: Take 1 tablet (500 mg total) by mouth 2 (two) times daily.    Dispense:  180 tablet    Refill:  4   Return in about 1 year (around 11/10/2016).     Penni Bombard, MD 03/08/1733, 6:70 PM Certified in Neurology, Neurophysiology and Neuroimaging  [facility] [address] [place], [place] [address] [phone]

## 2015-11-11 NOTE — Patient Instructions (Signed)
Thank you for coming to see [place] at [facility]. I hope we have been able to provide you high quality care today.  You may receive a patient satisfaction survey over the next few weeks. We would appreciate your feedback and comments so that we may continue to improve ourselves and the health of our patients.   - use goodrx.com website for discounts on medications    ~~~~~~~~~~~~~~~~~~~~~~~~~~~~~~~~~~~~~~~~~~~~~~~~~~~~~~~~~~~~~~~~~  DR. Renji Berwick'S GUIDE TO HAPPY AND HEALTHY LIVING These are some of my general health and wellness recommendations. Some of them may apply to you better than others. Please use common sense as you try these suggestions and feel free to ask me any questions.   ACTIVITY/FITNESS Mental, social, emotional and physical stimulation are very important for brain and body health. Try learning a new activity (arts, music, language, sports, games).  Keep moving your body to the best of your abilities. You can do this at home, inside or outside, the park, community center, gym or anywhere you like. Consider a physical therapist or personal trainer to get started. Consider the app Sworkit. Fitness trackers such as smart-watches, smart-phones or Fitbits can help as well.   NUTRITION Eat more plants: colorful vegetables, nuts, seeds and berries.  Eat less sugar, salt, preservatives and processed foods.  Avoid toxins such as cigarettes and alcohol.  Drink water when you are thirsty. Warm water with a slice of lemon is an excellent morning drink to start the day.  Consider these websites for more information The Nutrition Source (https://www.henry-hernandez.biz/) Precision Nutrition (WindowBlog.ch)   RELAXATION Consider practicing mindfulness meditation or other relaxation techniques such as deep breathing, prayer, yoga, tai chi, massage. See website mindful.org or the apps Headspace or Calm to help get  started.   SLEEP Try to get at least 7-8+ hours sleep per day. Regular exercise and reduced caffeine will help you sleep better. Practice good sleep hygeine techniques. See website sleep.org for more information.   PLANNING Prepare estate planning, living will, healthcare POA documents. Sometimes this is best planned with the help of an attorney. Theconversationproject.org and agingwithdignity.org are excellent resources.

## 2016-06-07 ENCOUNTER — Other Ambulatory Visit: Payer: Self-pay | Admitting: Cardiovascular Disease

## 2016-06-13 ENCOUNTER — Emergency Department (HOSPITAL_COMMUNITY): Payer: Self-pay

## 2016-06-13 ENCOUNTER — Emergency Department (HOSPITAL_COMMUNITY)
Admission: EM | Admit: 2016-06-13 | Discharge: 2016-06-13 | Disposition: A | Discharge status: Home / Self Care | Payer: Self-pay | Attending: Emergency Medicine | Admitting: Emergency Medicine

## 2016-06-13 ENCOUNTER — Encounter (HOSPITAL_COMMUNITY): Payer: Self-pay | Admitting: Emergency Medicine

## 2016-06-13 DIAGNOSIS — Y929 Unspecified place or not applicable: Secondary | ICD-10-CM | POA: Insufficient documentation

## 2016-06-13 DIAGNOSIS — S46811A Strain of other muscles, fascia and tendons at shoulder and upper arm level, right arm, initial encounter: Secondary | ICD-10-CM

## 2016-06-13 DIAGNOSIS — R202 Paresthesia of skin: Secondary | ICD-10-CM | POA: Insufficient documentation

## 2016-06-13 DIAGNOSIS — Y939 Activity, unspecified: Secondary | ICD-10-CM | POA: Insufficient documentation

## 2016-06-13 DIAGNOSIS — R930 Abnormal findings on diagnostic imaging of skull and head, not elsewhere classified: Secondary | ICD-10-CM | POA: Insufficient documentation

## 2016-06-13 DIAGNOSIS — F17210 Nicotine dependence, cigarettes, uncomplicated: Secondary | ICD-10-CM | POA: Insufficient documentation

## 2016-06-13 DIAGNOSIS — Z79899 Other long term (current) drug therapy: Secondary | ICD-10-CM | POA: Insufficient documentation

## 2016-06-13 DIAGNOSIS — X58XXXA Exposure to other specified factors, initial encounter: Secondary | ICD-10-CM | POA: Insufficient documentation

## 2016-06-13 DIAGNOSIS — I10 Essential (primary) hypertension: Secondary | ICD-10-CM | POA: Insufficient documentation

## 2016-06-13 DIAGNOSIS — Z7982 Long term (current) use of aspirin: Secondary | ICD-10-CM | POA: Insufficient documentation

## 2016-06-13 DIAGNOSIS — Y999 Unspecified external cause status: Secondary | ICD-10-CM | POA: Insufficient documentation

## 2016-06-13 DIAGNOSIS — S46911A Strain of unspecified muscle, fascia and tendon at shoulder and upper arm level, right arm, initial encounter: Secondary | ICD-10-CM | POA: Insufficient documentation

## 2016-06-13 DIAGNOSIS — E876 Hypokalemia: Secondary | ICD-10-CM | POA: Insufficient documentation

## 2016-06-13 DIAGNOSIS — Z5181 Encounter for therapeutic drug level monitoring: Secondary | ICD-10-CM | POA: Insufficient documentation

## 2016-06-13 LAB — COMPREHENSIVE METABOLIC PANEL WITH GFR
ALT: 16 U/L (ref 14–54)
AST: 16 U/L (ref 15–41)
Albumin: 4.2 g/dL (ref 3.5–5.0)
Alkaline Phosphatase: 52 U/L (ref 38–126)
Anion gap: 8 (ref 5–15)
BUN: 9 mg/dL (ref 6–20)
CO2: 27 mmol/L (ref 22–32)
Calcium: 9.5 mg/dL (ref 8.9–10.3)
Chloride: 105 mmol/L (ref 101–111)
Creatinine, Ser: 0.84 mg/dL (ref 0.44–1.00)
GFR calc Af Amer: >60 mL/min
GFR calc non Af Amer: >60 mL/min
Glucose, Bld: 120 mg/dL — ABNORMAL HIGH (ref 65–99)
Potassium: 2.9 mmol/L — ABNORMAL LOW (ref 3.5–5.1)
Sodium: 140 mmol/L (ref 135–145)
Total Bilirubin: 0.9 mg/dL (ref 0.3–1.2)
Total Protein: 7.4 g/dL (ref 6.5–8.1)

## 2016-06-13 LAB — I-STAT CHEM 8, ED
BUN: 7 mg/dL (ref 6–20)
CALCIUM ION: 1.16 mmol/L (ref 1.15–1.40)
CHLORIDE: 102 mmol/L (ref 101–111)
CREATININE: 0.9 mg/dL (ref 0.44–1.00)
GLUCOSE: 116 mg/dL — AB (ref 65–99)
HCT: 43 % (ref 36.0–46.0)
Hemoglobin: 14.6 g/dL (ref 12.0–15.0)
POTASSIUM: 3 mmol/L — AB (ref 3.5–5.1)
Sodium: 141 mmol/L (ref 135–145)
TCO2: 26 mmol/L (ref 0–100)

## 2016-06-13 LAB — PROTIME-INR
INR: 0.96
Prothrombin Time: 12.7 s (ref 11.4–15.2)

## 2016-06-13 LAB — DIFFERENTIAL
BASOS ABS: 0 10*3/uL (ref 0.0–0.1)
Basophils Relative: 0 %
EOS ABS: 0.1 10*3/uL (ref 0.0–0.7)
Eosinophils Relative: 1 %
LYMPHS PCT: 28 %
Lymphs Abs: 2.8 10*3/uL (ref 0.7–4.0)
Monocytes Absolute: 0.6 10*3/uL (ref 0.1–1.0)
Monocytes Relative: 6 %
Neutro Abs: 6.4 10*3/uL (ref 1.7–7.7)
Neutrophils Relative %: 65 %

## 2016-06-13 LAB — CBC
HCT: 37.8 % (ref 36.0–46.0)
Hemoglobin: 12.9 g/dL (ref 12.0–15.0)
MCH: 29 pg (ref 26.0–34.0)
MCHC: 34.1 g/dL (ref 30.0–36.0)
MCV: 84.9 fL (ref 78.0–100.0)
Platelets: 237 10*3/uL (ref 150–400)
RBC: 4.45 MIL/uL (ref 3.87–5.11)
RDW: 13 % (ref 11.5–15.5)
WBC: 9.9 10*3/uL (ref 4.0–10.5)

## 2016-06-13 LAB — I-STAT TROPONIN, ED: Troponin i, poc: 0 ng/mL (ref 0.00–0.08)

## 2016-06-13 LAB — APTT: APTT: 28 s (ref 24–36)

## 2016-06-13 LAB — CBG MONITORING, ED: GLUCOSE-CAPILLARY: 115 mg/dL — AB (ref 65–99)

## 2016-06-13 MED ORDER — NAPROXEN 500 MG PO TABS: 500.0000 mg | ORAL_TABLET | Freq: Two times a day (BID) | ORAL | 0 refills | Status: AC

## 2016-06-13 MED ORDER — BUPIVACAINE HCL (PF) 0.5 % IJ SOLN
30.0000 mL | Freq: Once | INTRAMUSCULAR | Status: AC
  Administered 2016-06-13: 30 mL
  Filled 2016-06-13: qty 30

## 2016-06-13 MED ORDER — POTASSIUM CHLORIDE CRYS ER 20 MEQ PO TBCR
40.0000 meq | EXTENDED_RELEASE_TABLET | Freq: Once | ORAL | Status: AC
  Administered 2016-06-13: 40 meq via ORAL
  Filled 2016-06-13: qty 2

## 2016-06-13 MED ORDER — POTASSIUM CHLORIDE ER 20 MEQ PO TBCR
40.0000 meq | EXTENDED_RELEASE_TABLET | Freq: Two times a day (BID) | ORAL | 0 refills | Status: DC
Start: 2016-06-13 — End: 2016-09-28

## 2016-06-13 NOTE — ED Provider Notes (Addendum)
[facility]-EMERGENCY DEPT Provider Note   CSN: [id number] Arrival date & time: 06/13/16  1220     History   Chief Complaint Chief Complaint  Patient presents with  . Tingling    HPI [name] is a 56 y.o. female.  The history is provided by the patient.  Neurologic Problem  This is a new problem. The current episode started 2 days ago. The problem occurs constantly. The problem has not changed since onset.Associated symptoms comments: Right arm tingling and weakness with right facial tingling and whole tongue feels like it is tingling. Exacerbated by: using the right arm. Relieved by: Aleve, but then came back. Treatments tried: aleve. The treatment provided moderate relief.    Past Medical History:  Diagnosis Date  . Atrial fibrillation ([facility])   . Back pain   . History of echocardiogram    Echo 5/17 - mild concentric LVH, vigorous LVEF, EF 65-70%, no RWMA, Gr 1 DD, normal RVEF, normal bilat atrial size  . Hypertension   . Seizures ([facility])    around 2014    Patient Active Problem List   Diagnosis Date Noted  . Seizure disorder ([facility]) 05/22/2013  . SICKLE CELL TRAIT 11/05/2008  . SYNCOPE, HX OF 11/05/2008  . PAF (paroxysmal atrial fibrillation) (H[facility]) 11/05/2008  . FIBROCYSTIC BREAST DISEASE, HX OF 11/05/2008  . ANEMIA, OTHER, UNSPECIFIED 05/05/2006  . Tobacco abuse 05/05/2006  . TENSION HEADACHE 05/05/2006  . Essential hypertension, benign 05/05/2006  . GASTROESOPHAGEAL REFLUX, NO ESOPHAGITIS 05/05/2006    Past Surgical History:  Procedure Laterality Date  . CESAREAN SECTION      OB History    No data available       Home Medications    Prior to Admission medications   Medication Sig Start Date End Date Taking? Authorizing Provider  ALPRAZolam Prudy Feeler) 0.5 MG tablet Take 0.5 mg by mouth at bedtime as needed.   Yes Historical Provider, MD  amLODipine (NORVASC) 5 MG tablet TAKE ONE TABLET BY MOUTH ONCE DAILY 06/08/16  Yes Tonny Bollman, MD  aspirin 325 MG tablet  Take 325 mg by mouth daily.   Yes Historical Provider, MD  carvedilol (COREG) 12.5 MG tablet TAKE ONE TABLET BY MOUTH TWICE DAILY WITH MEALS 07/14/15  Yes Tonny Bollman, MD  levETIRAcetam (KEPPRA) 500 MG tablet Take 1 tablet (500 mg total) by mouth 2 (two) times daily. 11/11/15  Yes Suanne Marker, MD  losartan-hydrochlorothiazide (HYZAAR) 100-25 MG tablet TAKE ONE TABLET BY MOUTH TWICE DAILY 08/11/15  Yes Tonny Bollman, MD  Multiple Vitamins-Minerals (MULTIVITAMIN WITH MINERALS) tablet Take 1 tablet by mouth daily.   Yes Historical Provider, MD  oxyCODONE-acetaminophen (PERCOCET) 5-325 MG per tablet Take 1 tablet by mouth every 4 (four) hours as needed for severe pain. 09/18/14  Yes Jaynie Crumble, PA-C    Family History Family History  Problem Relation Age of Onset  . Sickle cell trait Sister   . Hypertension Brother   . Sickle cell anemia Mother   . Hypertension Mother   . Seizures Sister   . Sickle cell trait Sister   . Hypertension Brother   . Hypertension Brother     Social History Social History  Substance Use Topics  . Smoking status: Current Every Day Smoker    Packs/day: 0.50    Types: Cigarettes  . Smokeless tobacco: Never Used     Comment: 11/11/15 trying to quit  . Alcohol use 0.0 oz/week     Comment: ocassionally     Allergies  Penicillins   Review of Systems Review of Systems  All other systems reviewed and are negative.    Physical Exam Updated Vital Signs BP (!) 175/109   Pulse 71   Temp 98 F (36.7 C) (Oral)   Resp 16   SpO2 100%   Physical Exam  Constitutional: She is oriented to person, place, and time. She appears well-developed and well-nourished. No distress.  HENT:  Head: Normocephalic.  Nose: Nose normal.  Eyes: Conjunctivae are normal.  Neck: Neck supple. No tracheal deviation present.  Cardiovascular: Normal rate and regular rhythm.   Pulmonary/Chest: Effort normal. No respiratory distress.  Abdominal: Soft. She exhibits no  distension.  Musculoskeletal:  Tenderness over right shoulder superior and posteriorly as well as right neck musculature which reproduces tingling symptoms in right arm  Neurological: She is alert and oriented to person, place, and time. She has normal strength. She displays no atrophy. No cranial nerve deficit or sensory deficit. Coordination normal.  Full strength and sensation of bilateral upper extremities  Skin: Skin is warm and dry.  Psychiatric: She has a normal mood and affect.     ED Treatments / Results  Labs (all labs ordered are listed, but only abnormal results are displayed) Labs Reviewed  CBG MONITORING, ED - Abnormal; Notable for the following:       Result Value   Glucose-Capillary 115 (*)    All other components within normal limits  I-STAT CHEM 8, ED - Abnormal; Notable for the following:    Potassium 3.0 (*)    Glucose, Bld 116 (*)    All other components within normal limits  CBC  DIFFERENTIAL  PROTIME-INR  APTT  COMPREHENSIVE METABOLIC PANEL  I-STAT TROPOININ, ED    EKG  EKG Interpretation  Date/Time:  Sunday June 13 2016 12:27:18 EDT Ventricular Rate:  75 PR Interval:    QRS Duration: 95 QT Interval:  396 QTC Calculation: 443 R Axis:   59 Text Interpretation:  Sinus rhythm Left atrial enlargement Left ventricular hypertrophy Repolarization abnormality secondary to ventricular hypertrophy Confirmed by Meldrick Buttery MD, Kiyanna Biegler ([id number]) on 06/13/2016 12:42:40 PM       Radiology Ct Head Wo Contrast  Result Date: 06/13/2016 CLINICAL DATA:  Pt c/o right arm numb and tingling and weakness, right neck face numb and tingling onset Friday. Tongue numb and tingling onset today at about 1100, confusion and difficulty speaking onset today at 1100, was normal before that. No vision changes. EXAM: CT HEAD WITHOUT CONTRAST TECHNIQUE: Contiguous axial images were obtained from the base of the skull through the vertex without intravenous contrast. COMPARISON:  Head CT dated  06/24/2009 and brain MRI dated 06/24/2009. FINDINGS: Brain: Ventricles are normal in size and configuration. There is no mass, hemorrhage, edema or other evidence of acute parenchymal abnormality. Normal gray-white matter attenuation. No extra-axial hemorrhage. Vascular: No hyperdense vessel or unexpected calcification. Skull: Normal. Negative for fracture or focal lesion. Sinuses/Orbits: No acute finding. Other: None. IMPRESSION: Normal head CT. Electronically Signed   By: Bary Richard M.D.   On: 06/13/2016 13:04    Procedures Procedures (including critical care time)  Procedure Note: Trigger Point Injection for Myofascial pain  Performed by Dr. Clydene Pugh Indication: muscle/myofascial pain Muscle body and tendon sheath of the right superior trapezius, right inferior trapezius and right thoracic paraspinal muscle(s) were injected with 0.5% bupivacaine under sterile technique for release of muscle spasm/pain. Patient tolerated well with immediate improvement of symptoms and no immediate complications following procedure.  CPT Code:  3 or more: 13086   Medications Ordered in ED Medications  bupivacaine (MARCAINE) 0.5 % injection 30 mL (30 mLs Infiltration Given 06/13/16 1541)  potassium chloride SA (K-DUR,KLOR-CON) CR tablet 40 mEq (40 mEq Oral Given 06/13/16 1605)     Initial Impression / Assessment and Plan / ED Course  I have reviewed the triage vital signs and the nursing notes.  Pertinent labs & imaging results that were available during my care of the patient were reviewed by me and considered in my medical decision making (see chart for details).     56 y.o. female with history of HTN, prior seizure on keppra, atrial fibrillation on daily aspirin and anxiety presents with numbness and tingling of right arm since 2 days ago with right facial tingling and developed tongue tingling today. Her arm symptoms are reproducible with palpation and movement of the right shoulder suspicious for a  peripheral nerve process or other benign etiology. Head CT without central lesion despite multiple days of symptoms. Discomfort and tingling improved following local anaesthesia of right shoulder and back. Patient was recommended to take short course of scheduled NSAIDs and engage in early mobility as definitive treatment. Has some hypokalemia likely 2/2 HCTZ which is possibly contributing so will replete. Patient needs to establish primary care in the area and was provided contact information to do so. Return precautions discussed for worsening or new concerning symptoms.    Final Clinical Impressions(s) / ED Diagnoses   Final diagnoses:  Paresthesia  Hypokalemia  Strain of right trapezius muscle, initial encounter    New Prescriptions Discharge Medication List as of 06/13/2016  5:07 PM    START taking these medications   Details  naproxen (NAPROSYN) 500 MG tablet Take 1 tablet (500 mg total) by mouth 2 (two) times daily with a meal., Starting Sun 06/13/2016, Until Fri 06/18/2016, Print    potassium chloride 20 MEQ TBCR Take 40 mEq by mouth 2 (two) times daily., Starting Sun 06/13/2016, Until Wed 06/16/2016, Print         Lyndal Pulley, MD 06/13/16 1332    Lyndal Pulley, MD 06/14/16 1500

## 2016-06-13 NOTE — ED Triage Notes (Addendum)
Pt c/o right arm numb and tingling and weakness, right neck face numb and tingling onset Friday. Tongue numb and tingling onset today at about 1100, confusion and difficulty speaking onset today at 1100, was normal before that. No vision changes.  Right arm weak, right arm pronator drift. Right arm and right midface sensation decreased. Tongue fasciculations. Otherwise CN II-XII intact to exam.

## 2016-08-10 ENCOUNTER — Other Ambulatory Visit: Payer: Self-pay | Admitting: Cardiovascular Disease

## 2016-09-07 ENCOUNTER — Other Ambulatory Visit: Payer: Self-pay

## 2016-09-07 ENCOUNTER — Other Ambulatory Visit: Payer: Self-pay | Admitting: Cardiovascular Disease

## 2016-09-07 MED ORDER — CARVEDILOL 12.5 MG PO TABS
12.5000 mg | ORAL_TABLET | Freq: Two times a day (BID) | ORAL | 0 refills | Status: DC
Start: 2016-09-07 — End: 2016-09-24

## 2016-09-22 ENCOUNTER — Encounter (HOSPITAL_COMMUNITY): Payer: Self-pay | Admitting: Emergency Medicine

## 2016-09-22 ENCOUNTER — Emergency Department (HOSPITAL_COMMUNITY)
Admission: EM | Admit: 2016-09-22 | Discharge: 2016-09-22 | Disposition: A | Discharge status: Home / Self Care | Payer: Self-pay | Attending: Emergency Medicine | Admitting: Emergency Medicine

## 2016-09-22 ENCOUNTER — Emergency Department (HOSPITAL_COMMUNITY): Payer: Self-pay

## 2016-09-22 DIAGNOSIS — F17210 Nicotine dependence, cigarettes, uncomplicated: Secondary | ICD-10-CM | POA: Insufficient documentation

## 2016-09-22 DIAGNOSIS — Z720 Tobacco use: Secondary | ICD-10-CM

## 2016-09-22 DIAGNOSIS — Z79899 Other long term (current) drug therapy: Secondary | ICD-10-CM | POA: Insufficient documentation

## 2016-09-22 DIAGNOSIS — K30 Functional dyspepsia: Secondary | ICD-10-CM | POA: Insufficient documentation

## 2016-09-22 DIAGNOSIS — R079 Chest pain, unspecified: Secondary | ICD-10-CM

## 2016-09-22 DIAGNOSIS — I10 Essential (primary) hypertension: Secondary | ICD-10-CM | POA: Insufficient documentation

## 2016-09-22 DIAGNOSIS — G44209 Tension-type headache, unspecified, not intractable: Secondary | ICD-10-CM

## 2016-09-22 DIAGNOSIS — E876 Hypokalemia: Secondary | ICD-10-CM | POA: Insufficient documentation

## 2016-09-22 DIAGNOSIS — Z566 Other physical and mental strain related to work: Secondary | ICD-10-CM | POA: Insufficient documentation

## 2016-09-22 LAB — BASIC METABOLIC PANEL
ANION GAP: 9 (ref 5–15)
BUN: 13 mg/dL (ref 6–20)
CHLORIDE: 103 mmol/L (ref 101–111)
CO2: 27 mmol/L (ref 22–32)
Calcium: 9.4 mg/dL (ref 8.9–10.3)
Creatinine, Ser: 0.79 mg/dL (ref 0.44–1.00)
GFR calc non Af Amer: >60 mL/min (ref >60)
Glucose, Bld: 102 mg/dL — ABNORMAL HIGH (ref 65–99)
Potassium: 3.3 mmol/L — ABNORMAL LOW (ref 3.5–5.1)
Sodium: 139 mmol/L (ref 135–145)

## 2016-09-22 LAB — URINALYSIS, ROUTINE W REFLEX MICROSCOPIC
Bilirubin Urine: NEGATIVE
Glucose, UA: NEGATIVE mg/dL
Hgb urine dipstick: NEGATIVE
Ketones, ur: NEGATIVE mg/dL
LEUKOCYTES UA: NEGATIVE
NITRITE: NEGATIVE
PH: 5 (ref 5.0–8.0)
Protein, ur: NEGATIVE mg/dL
SPECIFIC GRAVITY, URINE: 1.008 (ref 1.005–1.030)

## 2016-09-22 LAB — CBC WITH DIFFERENTIAL/PLATELET
BASOS PCT: 0 %
Basophils Absolute: 0 10*3/uL (ref 0.0–0.1)
Eosinophils Absolute: 0.2 10*3/uL (ref 0.0–0.7)
Eosinophils Relative: 2 %
HEMATOCRIT: 37.2 % (ref 36.0–46.0)
HEMOGLOBIN: 12.8 g/dL (ref 12.0–15.0)
LYMPHS ABS: 3.1 10*3/uL (ref 0.7–4.0)
Lymphocytes Relative: 39 %
MCH: 29 pg (ref 26.0–34.0)
MCHC: 34.4 g/dL (ref 30.0–36.0)
MCV: 84.4 fL (ref 78.0–100.0)
MONOS PCT: 6 %
Monocytes Absolute: 0.5 10*3/uL (ref 0.1–1.0)
NEUTROS ABS: 4.2 10*3/uL (ref 1.7–7.7)
NEUTROS PCT: 53 %
Platelets: 246 10*3/uL (ref 150–400)
RBC: 4.41 MIL/uL (ref 3.87–5.11)
RDW: 12.8 % (ref 11.5–15.5)
WBC: 8 10*3/uL (ref 4.0–10.5)

## 2016-09-22 LAB — I-STAT TROPONIN, ED: Troponin i, poc: 0 ng/mL (ref 0.00–0.08)

## 2016-09-22 MED ORDER — METOCLOPRAMIDE HCL 10 MG PO TABS: 10.0000 mg | ORAL_TABLET | Freq: Four times a day (QID) | ORAL | 0 refills | Status: DC | PRN

## 2016-09-22 MED ORDER — SODIUM CHLORIDE 0.9 % IV BOLUS (SEPSIS)
1000.0000 mL | Freq: Once | INTRAVENOUS | Status: AC
  Administered 2016-09-22: 1000 mL via INTRAVENOUS

## 2016-09-22 MED ORDER — POTASSIUM CHLORIDE CRYS ER 20 MEQ PO TBCR
40.0000 meq | EXTENDED_RELEASE_TABLET | Freq: Once | ORAL | Status: AC
  Administered 2016-09-22: 40 meq via ORAL
  Filled 2016-09-22: qty 2

## 2016-09-22 MED ORDER — METOCLOPRAMIDE HCL 5 MG/ML IJ SOLN
10.0000 mg | Freq: Once | INTRAMUSCULAR | Status: AC
  Administered 2016-09-22: 10 mg via INTRAVENOUS
  Filled 2016-09-22: qty 2

## 2016-09-22 MED ORDER — KETOROLAC TROMETHAMINE 30 MG/ML IJ SOLN
15.0000 mg | Freq: Once | INTRAMUSCULAR | Status: AC
  Administered 2016-09-22: 15 mg via INTRAVENOUS
  Filled 2016-09-22: qty 1

## 2016-09-22 MED ORDER — DIPHENHYDRAMINE HCL 50 MG/ML IJ SOLN
25.0000 mg | Freq: Once | INTRAMUSCULAR | Status: AC
  Administered 2016-09-22: 25 mg via INTRAVENOUS
  Filled 2016-09-22: qty 1

## 2016-09-22 NOTE — Discharge Instructions (Signed)
Alternate between tylenol and motrin as needed for pain/headaches. Use reglan as needed for headaches or nausea. Stay well hydrated. Take your blood pressure medications as directed. Eat a low salt diet. Try using tums/maalox/zantac over the counter to help with indigestion, and follow the list below of food changes to make to help indigestion. Get plenty of rest. Follow up with your regular doctor in 1 week for recheck of symptoms and ongoing management of your headaches and blood pressure; your blood pressure medications may need to be changed so talk to your doctor about this issue and they can make any medication adjustments. STOP SMOKING! Return to the ER for changes or worsening symptoms.

## 2016-09-22 NOTE — ED Triage Notes (Signed)
Pt complaint of headache, blurred vision, and dizziness related to hypertension; pt complaint with medication.

## 2016-09-22 NOTE — ED Provider Notes (Signed)
[facility]-EMERGENCY DEPT Provider Note   CSN: [id number] Arrival date & time: 09/22/16  1131     History   Chief Complaint Chief Complaint  Patient presents with  . Headache    HPI [name] is a 56 y.o. female with a PMHx of sickle cell trait, PAF (on ASA 325mg  daily), HTN, seizures, headaches, and other medical conditions listed below, who presents to the ED with complaints of issues with her hypertension and complaints of headache, lightheadedness, and blurred vision. She states that yesterday she had a severe headache which finally resolved after she went to sleep, this morning she awoke feeling better however shortly after getting to work her headache returned but less severe than before. She attributes her headache and symptoms to having high blood pressure. She states it feels similar to when she's had hypertension related headaches before. She describes her headache as 4/10 intermittent nonradiating pressure-like pain on both parietal regions, with no known diarrhea factors, and improved with rest. Associated symptoms include nausea, lightheadedness with sitting or standing, and blurred vision/floaters in her vision earlier. At work when she is having the symptoms her blood pressure was checked and it was 170/120, she tried drinking vinegar with water and resting which helped but then her blood pressure went back up so she came to the emergency room for further evaluation. She states that she has been compliant with her losartan HCTZ 100-25 MG daily, amlodipine 5 mg daily, and carvedilol 12.5 mg twice a day. She hasn't had any of her medications changed recently, she follows with Dr. Excell Seltzerooper and last saw him in January. Of note, chart review reveals that since April 2018 her blood pressures have been trending up in the 150-170s/80-100s. She acknowledges that she's had some increased stress at work. During the interview she later mentions that she has also had intermittent chest pain for the  last 1 week that she thought was just indigestion, stating that it's a sharp pain just left of the epigastric area which is nonradiating and occurs for a few seconds intermittently and then goes away spontaneously. She does admit to being a cigarette smoker. Mentions that last month she had a cold, and her cough since then has been improving.  She denies any syncope, loss of vision, fevers, chills, SOB, leg swelling, recent travel/surgery/immobilization, estrogen use, family or personal history of DVT/PE, abdominal pain, vomiting, diarrhea, constipation, dysuria, hematuria, myalgias, arthralgias, numbness, tingling, focal weakness, diaphoresis, increasing cough, or any other complaints at this time. Denies any head injury or recent tick bites.   The history is provided by the patient and medical records. No language interpreter was used.  Headache   This is a recurrent problem. The current episode started yesterday. Episode frequency: intermittent. The problem has been gradually improving. The headache is associated with emotional stress. The pain is located in the bilateral and parietal region. Quality: pressure. The pain is at a severity of 4/10. The pain is mild. The pain does not radiate. Associated symptoms include nausea. Pertinent negatives include no fever, no syncope, no shortness of breath and no vomiting. She has tried resting in a darkened room for the symptoms. The treatment provided mild relief.    Past Medical History:  Diagnosis Date  . Atrial fibrillation (HCC)   . Back pain   . History of echocardiogram    Echo 5/17 - mild concentric LVH, vigorous LVEF, EF 65-70%, no RWMA, Gr 1 DD, normal RVEF, normal bilat atrial size  . Hypertension   .  Seizures (HCC)    around 2014    Patient Active Problem List   Diagnosis Date Noted  . Seizure disorder (HCC) 05/22/2013  . SICKLE CELL TRAIT 11/05/2008  . SYNCOPE, HX OF 11/05/2008  . PAF (paroxysmal atrial fibrillation) (HCC) 11/05/2008   . FIBROCYSTIC BREAST DISEASE, HX OF 11/05/2008  . ANEMIA, OTHER, UNSPECIFIED 05/05/2006  . Tobacco abuse 05/05/2006  . TENSION HEADACHE 05/05/2006  . Essential hypertension, benign 05/05/2006  . GASTROESOPHAGEAL REFLUX, NO ESOPHAGITIS 05/05/2006    Past Surgical History:  Procedure Laterality Date  . CESAREAN SECTION      OB History    No data available       Home Medications    Prior to Admission medications   Medication Sig Start Date End Date Taking? Authorizing Provider  ALPRAZolam Prudy Feeler) 0.5 MG tablet Take 0.5 mg by mouth 2 (two) times daily as needed (muscle relaxer).    Yes [provider]  amLODipine (NORVASC) 5 MG tablet TAKE ONE TABLET BY MOUTH ONCE DAILY 06/08/16  Yes Tonny Bollman, MD  aspirin 325 MG tablet Take 325 mg by mouth daily.   Yes [provider]  carvedilol (COREG) 12.5 MG tablet Take 1 tablet (12.5 mg total) by mouth 2 (two) times daily with a meal. 09/07/16  Yes Tonny Bollman, MD  ibuprofen (ADVIL,MOTRIN) 200 MG tablet Take 400 mg by mouth every 6 (six) hours as needed (back pain).   Yes [provider]  levETIRAcetam (KEPPRA) 500 MG tablet Take 1 tablet (500 mg total) by mouth 2 (two) times daily. 11/11/15  Yes Penumalli, Glenford Bayley, MD  losartan-hydrochlorothiazide (HYZAAR) 100-25 MG tablet TAKE ONE TABLET BY MOUTH TWICE DAILY 08/11/15  Yes Tonny Bollman, MD  Multiple Vitamins-Minerals (MULTIVITAMIN WITH MINERALS) tablet Take 1 tablet by mouth daily.   Yes [provider]  oxyCODONE-acetaminophen (PERCOCET) 5-325 MG per tablet Take 1 tablet by mouth every 4 (four) hours as needed for severe pain. Patient not taking: Reported on 09/22/2016 09/18/14   Jaynie Crumble, PA-C  potassium chloride 20 MEQ TBCR Take 40 mEq by mouth 2 (two) times daily. Patient taking differently: Take 40 mEq by mouth once a week.  06/13/16 06/16/16  Lyndal Pulley, MD    Family History Family History  Problem Relation Age of Onset  . Sickle  cell trait Sister   . Hypertension Brother   . Sickle cell anemia Mother   . Hypertension Mother   . Seizures Sister   . Sickle cell trait Sister   . Hypertension Brother   . Hypertension Brother     Social History Social History  Substance Use Topics  . Smoking status: Current Every Day Smoker    Packs/day: 0.50    Types: Cigarettes  . Smokeless tobacco: Never Used     Comment: 11/11/15 trying to quit  . Alcohol use 0.0 oz/week     Comment: ocassionally     Allergies   Penicillins   Review of Systems Review of Systems  Constitutional: Negative for chills, diaphoresis and fever.  Eyes: Positive for visual disturbance.  Respiratory: Negative for cough and shortness of breath.   Cardiovascular: Positive for chest pain. Negative for leg swelling and syncope.  Gastrointestinal: Positive for nausea. Negative for abdominal pain, constipation, diarrhea and vomiting.  Genitourinary: Negative for dysuria and hematuria.  Musculoskeletal: Negative for arthralgias and myalgias.  Skin: Negative for color change.  Allergic/Immunologic: Negative for immunocompromised state.  Neurological: Positive for light-headedness and headaches. Negative for syncope, weakness and numbness.  Psychiatric/Behavioral: Negative for confusion.   All other systems reviewed and are negative for acute change except as noted in the HPI.    Physical Exam Updated Vital Signs BP (!) 163/102 (BP Location: Left Arm)   Pulse 64   Temp 98.2 F (36.8 C) (Oral)   Resp 17   Ht 5' 4.5" (1.638 m)   Wt 70.3 kg (155 lb)   SpO2 100%   BMI 26.19 kg/m   Physical Exam  Constitutional: She is oriented to person, place, and time. Vital signs are normal. She appears well-developed and well-nourished.  Non-toxic appearance. No distress.  Afebrile, nontoxic, NAD, HTN noted similar to prior visits since April 2018  HENT:  Head: Normocephalic and atraumatic.  Mouth/Throat: Oropharynx is clear and moist and mucous  membranes are normal.  Eyes: Pupils are equal, round, and reactive to light. Conjunctivae and EOM are normal. Right eye exhibits no discharge. Left eye exhibits no discharge.  PERRL, EOMI, no nystagmus, no visual field deficits   Neck: Normal range of motion. Neck supple. No spinous process tenderness and no muscular tenderness present. No neck rigidity. Normal range of motion present.  FROM intact without spinous process TTP, no bony stepoffs or deformities, no paraspinous muscle TTP or muscle spasms. No rigidity or meningeal signs. No bruising or swelling.   Cardiovascular: Normal rate, regular rhythm, normal heart sounds and intact distal pulses.  Exam reveals no gallop and no friction rub.   No murmur heard. RRR, nl s1/s2, no m/r/g, distal pulses intact, no pedal edema   Pulmonary/Chest: Effort normal and breath sounds normal. No respiratory distress. She has no decreased breath sounds. She has no wheezes. She has no rhonchi. She has no rales. She exhibits no tenderness, no crepitus, no deformity and no retraction.  CTAB in all lung fields, no w/r/r, no hypoxia or increased WOB, speaking in full sentences, SpO2 100% on RA Chest wall nonTTP without crepitus, deformities, or retractions   Abdominal: Soft. Normal appearance and bowel sounds are normal. She exhibits no distension. There is no tenderness. There is no rigidity, no rebound, no guarding, no CVA tenderness, no tenderness at McBurney's point and negative Murphy's sign.  Musculoskeletal: Normal range of motion.  MAE x4 Strength and sensation grossly intact in all extremities Distal pulses intact Gait steady  Neurological: She is alert and oriented to person, place, and time. She has normal strength. No cranial nerve deficit or sensory deficit. Coordination and gait normal. GCS eye subscore is 4. GCS verbal subscore is 5. GCS motor subscore is 6.  CN 2-12 grossly intact A&O x4 GCS 15 Sensation and strength intact Gait nonataxic  including with tandem walking Coordination with finger-to-nose WNL Neg pronator drift   Skin: Skin is warm, dry and intact. No rash noted.  Psychiatric: She has a normal mood and affect.  Nursing note and vitals reviewed.  Orthostatic VS for the past 24 hrs:  BP- Lying Pulse- Lying BP- Sitting Pulse- Sitting BP- Standing at 0 minutes Pulse- Standing at 0 minutes  09/22/16 1504 (!) 147/92 59 143/85 62 145/87 64     ED Treatments / Results  Labs (all labs ordered are listed, but only abnormal results are displayed) Labs Reviewed  BASIC METABOLIC PANEL - Abnormal; Notable for the following:       Result Value   Potassium 3.3 (*)    Glucose, Bld 102 (*)    All other components within normal limits  URINALYSIS, ROUTINE W REFLEX MICROSCOPIC - Abnormal; Notable for  the following:    Color, Urine STRAW (*)    All other components within normal limits  CBC WITH DIFFERENTIAL/PLATELET  Rosezena Sensor, ED    EKG  EKG Interpretation  Date/Time:  Wednesday September 22 2016 11:56:33 EDT Ventricular Rate:  62 PR Interval:    QRS Duration: 87 QT Interval:  426 QTC Calculation: 433 R Axis:   37 Text Interpretation:  Sinus rhythm ST elevation suggests acute pericarditis No acute changes Nonspecific ST and T wave abnormality No significant change since last tracing Confirmed by Derwood Kaplan [phone]) on 09/22/2016 2:42:50 PM       Radiology Dg Chest 2 View  Result Date: 09/22/2016 CLINICAL DATA:  Headache and blurred vision.  Dizziness. EXAM: CHEST  2 VIEW COMPARISON:  One-view chest x-ray 05/07/2007 FINDINGS: The heart size and mediastinal contours are within normal limits. Both lungs are clear. The visualized skeletal structures are unremarkable. IMPRESSION: Negative two view chest x-ray Electronically Signed   By: Marin Roberts M.D.   On: 09/22/2016 14:43    Procedures Procedures (including critical care time)  Medications Ordered in ED Medications  metoCLOPramide (REGLAN)  injection 10 mg (10 mg Intravenous Given 09/22/16 1451)    And  diphenhydrAMINE (BENADRYL) injection 25 mg (25 mg Intravenous Given 09/22/16 1451)    And  sodium chloride 0.9 % bolus 1,000 mL (1,000 mLs Intravenous New Bag/Given 09/22/16 1503)    And  ketorolac (TORADOL) 30 MG/ML injection 15 mg (15 mg Intravenous Given 09/22/16 1451)  potassium chloride SA (K-DUR,KLOR-CON) CR tablet 40 mEq (40 mEq Oral Given 09/22/16 1451)     Initial Impression / Assessment and Plan / ED Course  I have reviewed the triage vital signs and the nursing notes.  Pertinent labs & imaging results that were available during my care of the patient were reviewed by me and considered in my medical decision making (see chart for details).     56 y.o. female here with issues with her BP going up, causing headache, lightheadedness, and blurry vision; states it's similar to prior times she's had HTN issues. No changes in meds recently, and compliant with meds. However chart review reveals she's had climbing BPs since April 2018 (150s-170s/80-100s). States she has a stressful job that could be contributing to it. +Smoker, cessation strongly encouraged. She also mentions that for 1 wk she's had intermittent sharp L sided CP that occurs for a few seconds and then resolves, which she attributed to indigestion. On exam, no focal neuro deficits, no hypoxia or tachycardia, no LE swelling, no chest wall tenderness. BP 163/102 which is similar to prior visits since April. Labs thus far show: BMP with K 3.3, will orally replete; U/A unremarkable. EKG with some ST elevations diffusely that look like early repol, and have been seen on prior EKGs; no acute ischemic findings, and doubt pericarditis given similarity to prior EKGs (and pt already on daily ASA so even if it is pericarditis, treatment would be ASA so she's already on that tx). Will get orthostatics, troponin, CBC w/diff, CXR, and give migraine cocktail. Will reassess shortly.    5:09 PM Orthostatics negative. Trop negative. CBC w/diff WNL. CXR negative. Pt feeling much better, and BP actually improving. Symptoms likely related to uncontrolled HTN, advised DASH diet and smoking cessation, take BP meds like they're prescribed and f/up with her dr to see if her meds need to be adjusted. CP likely related to indigestion, doubt pericarditis and she's already on ASA as previously mentioned so the  tx of that would already be done. Advised diet/lifestyle modifications to help with GERD. Advised tums/maalox/zantac as needed for help with this. Will rx reglan to help with nausea and headaches. Advised tylenol/motrin for pain, stay well hydrated. F/up with PCP in 1wk for recheck and ongoing management. I explained the diagnosis and have given explicit precautions to return to the ER including for any other new or worsening symptoms. The patient understands and accepts the medical plan as it's been dictated and I have answered their questions. Discharge instructions concerning home care and prescriptions have been given. The patient is STABLE and is discharged to home in good condition.    Final Clinical Impressions(s) / ED Diagnoses   Final diagnoses:  Acute non intractable tension-type headache  Essential hypertension  Tobacco user  Stress at work  Intermittent chest pain  Indigestion  Hypokalemia    New Prescriptions New Prescriptions   METOCLOPRAMIDE (REGLAN) 10 MG TABLET    Take 1 tablet (10 mg total) by mouth every 6 (six) hours as needed for nausea (nausea/headache).     [address] Thresa Dozier, [place], [place] 09/22/16 1712    Derwood Kaplan, MD 09/23/16 [phone]

## 2016-09-22 NOTE — ED Notes (Signed)
Pt in x-ray. Will obtain blood labs and administer medications when pt returns.

## 2016-09-23 ENCOUNTER — Telehealth: Payer: Self-pay | Admitting: Cardiovascular Disease

## 2016-09-23 NOTE — Telephone Encounter (Signed)
Pt c/o BP issue: STAT if pt c/o blurred vision, one-sided weakness or slurred speech  1. What are your last 5 BP readings? 179/107  2. Are you having any other symptoms (ex. Dizziness, headache, blurred vision, passed out)? Blurred vision, dizzy, headache (this happened yesterday)   3. What is your BP issue? BP high and "spikes"

## 2016-09-23 NOTE — Telephone Encounter (Signed)
Pt states yesterday at work she "felt funny", took BP meds prior to leaving for work and had a coworker check it and it was 170/107.  She laid down for a little while, rechecked BP and it was even higher.  Pt noted to have "floaters" and ultimately went to ER to get checked out.  Pt states BP came down to 115/88 so they let her go home.  The night before this episode she said she had a terrible HA and felt like her head was "going to bust".  Pt states ED MD advised her to f/u with Dr. Excell Seltzerooper within a week.  Scheduled pt to see Dr. Excell Seltzerooper 7/24.  Advised pt to continue monitoring BP and to bring those readings with her when she comes to see Dr. Excell Seltzerooper.  Pt appreciative for assistance.

## 2016-09-24 ENCOUNTER — Other Ambulatory Visit: Payer: Self-pay | Admitting: Cardiovascular Disease

## 2016-09-24 MED ORDER — CARVEDILOL 12.5 MG PO TABS: 12.5000 mg | ORAL_TABLET | Freq: Two times a day (BID) | ORAL | 0 refills | Status: DC

## 2016-09-27 NOTE — Telephone Encounter (Signed)
thanks

## 2016-09-28 ENCOUNTER — Encounter: Payer: Self-pay | Admitting: Cardiovascular Disease

## 2016-09-28 ENCOUNTER — Encounter (INDEPENDENT_AMBULATORY_CARE_PROVIDER_SITE_OTHER): Payer: Self-pay

## 2016-09-28 ENCOUNTER — Ambulatory Visit (INDEPENDENT_AMBULATORY_CARE_PROVIDER_SITE_OTHER): Payer: Self-pay | Admitting: Cardiovascular Disease

## 2016-09-28 VITALS — BP 130/88 | HR 63 | Ht 65.0 in | Wt 156.8 lb

## 2016-09-28 DIAGNOSIS — I10 Essential (primary) hypertension: Secondary | ICD-10-CM

## 2016-09-28 DIAGNOSIS — R252 Cramp and spasm: Secondary | ICD-10-CM

## 2016-09-28 DIAGNOSIS — I480 Paroxysmal atrial fibrillation: Secondary | ICD-10-CM

## 2016-09-28 DIAGNOSIS — E876 Hypokalemia: Secondary | ICD-10-CM

## 2016-09-28 MED ORDER — POTASSIUM CHLORIDE CRYS ER 20 MEQ PO TBCR: 20.0000 meq | EXTENDED_RELEASE_TABLET | Freq: Two times a day (BID) | ORAL | 3 refills | Status: DC

## 2016-09-28 NOTE — Progress Notes (Signed)
Cardiology Office Note Date:  09/30/2016   ID:  [name], DOB 20-Apr-1960, MRN [id number]  PCP:  Patient, No Pcp Per  Cardiologist:  Tonny Bollman, MD    Chief Complaint  Patient presents with  . Follow-up    hospital visit/HTN/visual disturbaces w/ elevated BP     History of Present Illness: [name] is a 56 y.o. female who presents for Follow-up evaluation. She's had a remote history of atrial fibrillation but has never been anticoagulated. The patient has a seizure disorder.  The patient was evaluated in the emergency department July 18 because of the severe headache. Her blood pressure was noted to be markedly elevated. She was advised to follow-up here for further evaluation. She is treated for headache while she was there. Leading up to her emergency room evaluation she noted on her home cuff that her blood pressure was running very high. She did experience some blurry vision as well. All of her symptoms have now resolved and she is feeling better. She has no recurrent headache. She denies chest pain or shortness of breath. She is back to work without problems.   Past Medical History:  Diagnosis Date  . Atrial fibrillation (HCC)   . Back pain   . History of echocardiogram    Echo 5/17 - mild concentric LVH, vigorous LVEF, EF 65-70%, no RWMA, Gr 1 DD, normal RVEF, normal bilat atrial size  . Hypertension   . Seizures (HCC)    around 2014    Past Surgical History:  Procedure Laterality Date  . CESAREAN SECTION      Current Outpatient Prescriptions  Medication Sig Dispense Refill  . ALPRAZolam (XANAX) 0.5 MG tablet Take 0.5 mg by mouth 2 (two) times daily as needed (muscle relaxer).     Marland Kitchen amLODipine (NORVASC) 5 MG tablet TAKE ONE TABLET BY MOUTH ONCE DAILY 90 tablet 2  . aspirin 325 MG tablet Take 325 mg by mouth daily.    . carvedilol (COREG) 12.5 MG tablet Take 1 tablet (12.5 mg total) by mouth 2 (two) times daily with a meal. 60 tablet 0  . ferrous sulfate  325 (65 FE) MG EC tablet Take 325 mg by mouth daily.    Marland Kitchen ibuprofen (ADVIL,MOTRIN) 200 MG tablet Take 400 mg by mouth every 6 (six) hours as needed (back pain).    Marland Kitchen levETIRAcetam (KEPPRA) 500 MG tablet Take 1 tablet (500 mg total) by mouth 2 (two) times daily. 180 tablet 4  . losartan-hydrochlorothiazide (HYZAAR) 100-25 MG tablet Take 1 tablet by mouth daily.    . Multiple Vitamins-Minerals (MULTIVITAMIN WITH MINERALS) tablet Take 1 tablet by mouth daily.    . potassium chloride SA (K-DUR,KLOR-CON) 20 MEQ tablet Take 1 tablet (20 mEq total) by mouth 2 (two) times daily. 60 tablet 3   No current facility-administered medications for this visit.     Allergies:   Penicillins   Social History:  The patient  reports that she has been smoking Cigarettes.  She has been smoking about 0.50 packs per day. She has never used smokeless tobacco. She reports that she drinks alcohol. She reports that she does not use drugs.   Family History:  The patient's family history includes Hypertension in her brother, brother, brother, and mother; Seizures in her sister; Sickle cell anemia in her mother; Sickle cell trait in her sister and sister.    ROS:  Please see the history of present illness.  Otherwise, review of systems is positive for back pain, easy  bruising, excessive fatigue, headache.  All other systems are reviewed and negative.    PHYSICAL EXAM: VS:  BP 130/88   Pulse 63   Ht 5\' 5"  (1.651 m)   Wt 156 lb 12.8 oz (71.1 kg)   BMI 26.09 kg/m  , BMI Body mass index is 26.09 kg/m. GEN: Well nourished, well developed, in no acute distress  HEENT: normal  Neck: no JVD, no masses. No carotid bruits Cardiac: RRR without murmur or gallop                Respiratory:  clear to auscultation bilaterally, normal work of breathing GI: soft, nontender, nondistended, + BS MS: no deformity or atrophy  Ext: no pretibial edema, pedal pulses 2+= bilaterally Skin: warm and dry, no rash Neuro:  Strength and  sensation are intact Psych: euthymic mood, full affect  EKG:  EKG is ordered today. The ekg ordered today shows normal sinus rhythm 63 bpm, early repolarization  Recent Labs: 06/13/2016: ALT 16 09/22/2016: BUN 13; Creatinine, Ser 0.79; Hemoglobin 12.8; Platelets 246; Potassium 3.3; Sodium 139   Lipid Panel  No results found for: CHOL, TRIG, HDL, CHOLHDL, VLDL, LDLCALC, LDLDIRECT    Wt Readings from Last 3 Encounters:  09/28/16 156 lb 12.8 oz (71.1 kg)  09/22/16 155 lb (70.3 kg)  11/11/15 156 lb 3.2 oz (70.9 kg)    Cardiac Studies Reviewed: Echo 07-11-2015: Study Conclusions  - Left ventricle: The cavity size was normal. There was mild   concentric hypertrophy. Systolic function was vigorous. The   estimated ejection fraction was in the range of 65% to 70%. Wall   motion was normal; there were no regional wall motion   abnormalities. Doppler parameters are consistent with abnormal   left ventricular relaxation (grade 1 diastolic dysfunction).   Doppler parameters are consistent with mildly elevated   ventricular end-diastolic filling pressure. - Aortic root: The aortic root was normal in size. - Ascending aorta: The ascending aorta was normal in size. - Mitral valve: Structurally normal valve. There was trivial   regurgitation. - Left atrium: The atrium was normal in size. - Right ventricle: Systolic function was normal. - Right atrium: The atrium was normal in size. - Tricuspid valve: There was mild regurgitation. - Pulmonic valve: There was no regurgitation. - Pulmonary arteries: Systolic pressure was within the normal   range. - Inferior vena cava: The vessel was normal in size. - Pericardium, extracardiac: There was no pericardial effusion.  ASSESSMENT AND PLAN: 1.  Hypertension: Blood pressure seems to be better controlled. She will continue on amlodipine, carvedilol, and losartan/HCTZ at current doses. I asked her to continue to monitor her blood pressure at home. If  her readings are elevated we can increase her amlodipine to 10 mg. She will call in if that is the case.  2. Paroxysmal atrial fibrillation: No episodes documented in many years. Considering her seizure disorder anticoagulation does not seem like a good idea especially since she has been maintaining sinus rhythm on a consistent basis.  3. Tobacco abuse: Cessation counseling done. She is smoking 4 or 5 cigarettes a day at present.  4. Hypokalemia: Will start her on K-Dur 20 milliequivalents twice daily. This is probably related to hydrochlorothiazide. We discussed potassium rich foods. We'll check a metabolic panel in 2 weeks.  Current medicines are reviewed with the patient today.  The patient does not have concerns regarding medicines.  Labs/ tests ordered today include:   Orders Placed This Encounter  Procedures  .  Basic Metabolic Panel (BMET)  . EKG 12-Lead    Disposition:   FU APP 6 months  Signed, Tonny Bollman, MD  09/30/2016 5:25 AM    [facility] [address] [place], [place], [place]  [address] Phone: [phone]; Fax: [phone]

## 2016-09-28 NOTE — Patient Instructions (Signed)
Medication Instructions:  Your physician has recommended you make the following change in your medication:  1. STOP over the counter potassium supplement 2. START K-dur take one tablet by mouth twice a day   Labwork: Your physician recommends that you return for lab work in: 2 WEEKS (BMP)  Testing/Procedures: No new orders.   Follow-Up: Your physician recommends that you schedule a follow-up appointment in: 6 MONTHS with PA/NP   Any Other Special Instructions Will Be Listed Below (If Applicable).  Your physician has requested that you regularly monitor and record your blood pressure readings at home. Please use the same machine at the same time of day to check your readings and record them to bring to your follow-up visit. Please contact the office if your BP is consistently greater than 140/90.   If you need a refill on your cardiac medications before your next appointment, please call your pharmacy.

## 2016-10-11 ENCOUNTER — Other Ambulatory Visit: Payer: Self-pay | Admitting: Cardiovascular Disease

## 2016-10-12 NOTE — Telephone Encounter (Signed)
Pt's pharmacy is requesting a refill on Losartan-hydrochlorothiazide 100-25 mg tablet, taken BID. I do not see where Dr. Excell Seltzerooper increase this medication to BID, pt suppose to take medication once daily. Could you please clarify on how the pt is suppose to be taking this medication. Please advise. Thanks

## 2016-10-13 ENCOUNTER — Other Ambulatory Visit: Payer: Self-pay | Admitting: *Deleted

## 2016-10-13 MED ORDER — LOSARTAN POTASSIUM-HCTZ 100-25 MG PO TABS: 1.0000 | ORAL_TABLET | Freq: Every day | ORAL | 11 refills | Status: DC

## 2016-10-13 NOTE — Telephone Encounter (Signed)
Patient called to follow up on a refill request for losartan-hctz that she stated that her pharmacy sent to the office on Monday. She takes her last dose today. Looks like the request was forwarded to the nurse for clarification on how patient should be taking this medication. Patient stated that she takes this medication once daily and has always taken it that way. I will send in the rx.

## 2016-10-15 ENCOUNTER — Other Ambulatory Visit: Payer: Self-pay | Admitting: *Deleted

## 2016-10-15 DIAGNOSIS — I10 Essential (primary) hypertension: Secondary | ICD-10-CM

## 2016-10-15 NOTE — Addendum Note (Signed)
Addended by: Tonita PhoenixBOWDEN, Jamion Carter K on: 10/15/2016 04:46 PM   Modules accepted: Orders

## 2016-10-16 LAB — BASIC METABOLIC PANEL
BUN/Creatinine Ratio: 13 (ref 9–23)
BUN: 11 mg/dL (ref 6–24)
CO2: 26 mmol/L (ref 20–29)
CREATININE: 0.85 mg/dL (ref 0.57–1.00)
Calcium: 9.8 mg/dL (ref 8.7–10.2)
Chloride: 102 mmol/L (ref 96–106)
GFR, EST AFRICAN AMERICAN: 87 mL/min/{1.73_m2} (ref >59)
GFR, EST NON AFRICAN AMERICAN: 76 mL/min/{1.73_m2} (ref >59)
Glucose: 108 mg/dL — ABNORMAL HIGH (ref 65–99)
POTASSIUM: 3.5 mmol/L (ref 3.5–5.2)
Sodium: 143 mmol/L (ref 134–144)

## 2016-10-28 NOTE — Telephone Encounter (Signed)
Reviewed chart and an Rx for losartan HCT once a day was sent into the pharmacy on 10/13/16.

## 2016-10-29 ENCOUNTER — Telehealth: Payer: Self-pay | Admitting: Cardiovascular Disease

## 2016-10-29 NOTE — Telephone Encounter (Signed)
New message       Pt recently had labs drawn.  She want to know if we checked her glucose?  She had her glucose checked at work and it was 126 non fasting.  Pt wanted to compare the results.  Please call

## 2016-10-29 NOTE — Telephone Encounter (Signed)
LMTCB

## 2016-11-01 ENCOUNTER — Ambulatory Visit: Payer: Self-pay | Admitting: Cardiovascular Disease

## 2016-11-01 NOTE — Telephone Encounter (Signed)
Spoke with patient about lab results done 10/15/16.

## 2016-11-15 ENCOUNTER — Ambulatory Visit: Payer: Self-pay | Admitting: Diagnostic Neuroimaging

## 2016-11-21 ENCOUNTER — Other Ambulatory Visit: Payer: Self-pay | Admitting: Cardiovascular Disease

## 2016-11-30 ENCOUNTER — Telehealth: Payer: Self-pay | Admitting: Cardiovascular Disease

## 2016-11-30 NOTE — Telephone Encounter (Signed)
Left message that paperwork is received and she would be called tomorrow to review.

## 2016-11-30 NOTE — Telephone Encounter (Signed)
New Message  Pt call requesting to speak with RN to f/u on [employer] paperwork sent she states a month ago for her insurance  Company. Please call back to discuss

## 2016-12-01 NOTE — Telephone Encounter (Signed)
Left message to call back  

## 2016-12-03 NOTE — Telephone Encounter (Signed)
Late entry from 9/27 at 1500:  Informed patient [employer] paperwork has been filled out. Informed her that there is a spot on the paperwork that is required to be filled with alternative programs advisable by the doctor, and let her know that the Smoking Cessation program at [facility] was recommended if smoking reoccurs. She requests the paperwork mailed to her home. She was grateful for call and agrees with treatment plan.  Letter placed in outgoing mail.

## 2016-12-10 ENCOUNTER — Other Ambulatory Visit: Payer: Self-pay | Admitting: Diagnostic Neuroimaging

## 2017-02-10 ENCOUNTER — Other Ambulatory Visit: Payer: Self-pay | Admitting: Diagnostic Neuroimaging

## 2017-02-10 NOTE — Telephone Encounter (Signed)
LVM for patient advising her that this office refilled Keppra last month with stipulation she call, make payment and schedule a follow up. She was last seen 11/2015. Advised her she must be seen yearly for Dr Marjory LiesPenumalli to continue to prescribe medication. Strongly advised she call, make a payment and schedule a follow up. Left office number.

## 2017-02-16 ENCOUNTER — Telehealth: Payer: Self-pay | Admitting: Diagnostic Neuroimaging

## 2017-02-16 MED ORDER — LEVETIRACETAM 500 MG PO TABS: 500.0000 mg | ORAL_TABLET | Freq: Two times a day (BID) | ORAL | 3 refills | Status: DC

## 2017-02-16 NOTE — Telephone Encounter (Signed)
Spoke with patient to clarify correct pharmacy as this [facility] [address]. Patient got information, stated it is [facility] on [address]. Her snapshot updated. She stated she spoke to someone in billing, and she has FU scheduled with NP in Feb. This RN advised Keppra will be refilled today, advised she pick it up today and begin taking again. Patient verbalized understanding, appreciation.

## 2017-02-16 NOTE — Telephone Encounter (Signed)
Pt request refill for levETIRAcetam (KEPPRA) 500 MG tablet sent to [facility] on [address] (please change from [place]), she ran out on Friday 02/11/17. Pt was told by pharmacy refill request was denied. An appt has been scheduled with Eber Jonesarolyn for 2/27. Pt said she "doesn't feel right" since she has not had medication. Please call to advise

## 2017-03-12 ENCOUNTER — Other Ambulatory Visit: Payer: Self-pay | Admitting: Cardiovascular Disease

## 2017-04-13 NOTE — Progress Notes (Signed)
Subjective:    Patient ID: [name], female    DOB: February 11, 1961, 57 y.o.   MRN: [id number]  HPI Chief Complaint  Patient presents with  . Annual Exam    new patient CPE with pap. No concers- also to establish care.    She is new to the practice and here for a complete physical exam. Previous medical care: [facility]. Andi DevonKimberly Shelton.  Last CPE: 3 years ago.   History of hypertension.  Her cardiologist has been managing her blood pressure.  Other providers: Cardiologist- Dr. Excell Seltzerooper Neurologist- Dr. Doralee AlbinoPenumali  [facility]- Dr. [facility]  Dr. Chilton SiGreen- dentist    Past medical history: Seizure disorder- is on Keppra and followed by neurologist. States she had one seizure in 2014.  A-fib- not on anticoagulation due to seizure disorder per cardiologist notes.  Scheduled for MRI this week due to back pain.    Social history: Lives with her boyfriend, works as a Probation officerhealth care billing specialist for [employer] Diet: fairly healthy but reports eating too much  Excerise: nothing. States this is due to back pain.   She is a former smoker.  States she stopped smoking since months ago  Immunizations: flu shot- never and declines today. Tdap 5 years ago.   Health maintenance:  Mammogram: more than 10 years ago  Colonoscopy: in the past 6-7 years  Last Pap Smear: years ago  Last Menstrual cycle: late 340s  DEXA: never  Last Dental Exam: years ago  Last Eye Exam: 4 years ago   Wears seatbelt always, uses sunscreen, smoke detectors in home and functioning, does not text while driving and feels safe in home environment.   Reviewed allergies, medications, past medical, surgical, family, and social history.    Review of Systems Review of Systems Constitutional: -fever, -chills, -sweats, -unexpected weight change,-fatigue ENT: -runny nose, -ear pain, -sore throat Cardiology:  -chest pain, -palpitations, -edema Respiratory: -cough, -shortness of breath,  -wheezing Gastroenterology: -abdominal pain, -nausea, -vomiting, -diarrhea, -constipation  Hematology: -bleeding or bruising problems Musculoskeletal: -arthralgias, -myalgias, -joint swelling, +back pain Ophthalmology: -vision changes Urology: -dysuria, -difficulty urinating, -hematuria, -urinary frequency, -urgency Neurology: -headache, -weakness, -tingling, -numbness       Objective:   Physical Exam BP 132/86   Pulse 80   Ht 5\' 5"  (1.651 m)   Wt 157 lb 6.4 oz (71.4 kg)   BMI 26.19 kg/m   General Appearance:    Alert, cooperative, no distress, appears stated age  Head:    Normocephalic, without obvious abnormality, atraumatic  Eyes:    PERRL, conjunctiva/corneas clear, EOM's intact, fundi    benign  Ears:    Normal TM's and external ear canals  Nose:   Nares normal, mucosa normal, no drainage or sinus   tenderness  Throat:   Lips, mucosa, and tongue normal; teeth and gums normal  Neck:   Supple, no lymphadenopathy;  thyroid:  no   enlargement/tenderness/nodules; no carotid   bruit or JVD  Back:    Spine nontender, no curvature, ROM normal, no CVA     tenderness  Lungs:     Clear to auscultation bilaterally without wheezes, rales or     ronchi; respirations unlabored  Chest Wall:    No tenderness or deformity   Heart:    Regular rate and rhythm, S1 and S2 normal, no murmur, rub   or gallop  Breast Exam:    No tenderness, masses, or nipple discharge or inversion.      No  axillary lymphadenopathy  Abdomen:     Soft, non-tender, nondistended, normoactive bowel sounds,    no masses, no hepatosplenomegaly  Genitalia:    Normal external genitalia without lesions.  BUS and vagina normal; cervix without lesions, or cervical motion tenderness. No abnormal vaginal discharge.  Uterus and adnexa not enlarged, nontender, no masses.  Pap performed. Chaperone present.      Extremities:   No clubbing, cyanosis or edema  Pulses:   2+ and symmetric all extremities  Skin:   Skin color, texture,  turgor normal, no rashes or lesions  Lymph nodes:   Cervical, supraclavicular, and axillary nodes normal  Neurologic:   CNII-XII intact, normal strength, sensation and gait; reflexes 2+ and symmetric throughout          Psych:   Normal mood, affect, hygiene and grooming.    Urinalysis dipstick: negative       Assessment & Plan:  Routine general medical examination at a health care facility - Plan: Visual acuity screening, POCT Urinalysis DIP (Proadvantage Device), TSH, CBC with Differential/Platelet, Comprehensive metabolic panel  Essential hypertension, benign  Seizure disorder (HCC)  Estrogen deficiency - Plan: DG Bone Density  Screening for breast cancer - Plan: MM DIGITAL SCREENING BILATERAL  Screening for cervical cancer - Plan: Cytology - PAP  Need for hepatitis C screening test - Plan: Hepatitis C antibody  Vaccine counseling  Screen for STD (sexually transmitted disease) - Plan: RPR, HIV antibody, CBC with Differential/Platelet  Former smoker  She appears to be doing well physically and emotionally. She is seeing her cardiologist on a regular basis for blood pressure management as well as history of A. Fib. She is in a regular cardiac rhythm today. Congratulated her on stopping smoking.  She does not meet criteria for lung cancer screening per low-dose CT scan. She has not had a mammogram in years.  She has never had a bone density test.  Plan to order both of these through the breast center. Her last colonoscopy was at [facility].  We will attempt to get these records but I also advised her to call them and find out when she is due for her repeat colonoscopy. Counseled on healthy diet and exercise. Discussed safety and health promotion. Apparently she has never been screened for hepatitis C.  We will order this today. She would also like STD screening. Counseling done on flu vaccine and she declines. She will continue seeing her cardiologist as well as her neurologist  for seizure disorder. Pap smear done.  Chaperone present. Follow-up pending labs.

## 2017-04-14 ENCOUNTER — Ambulatory Visit: Payer: Managed Care, Other (non HMO) | Admitting: Family Medicine

## 2017-04-14 ENCOUNTER — Other Ambulatory Visit (HOSPITAL_COMMUNITY)
Admission: RE | Admit: 2017-04-14 | Discharge: 2017-04-14 | Disposition: A | Discharge status: Home / Self Care | Payer: Self-pay | Source: Ambulatory Visit | Attending: Family Medicine | Admitting: Family Medicine

## 2017-04-14 ENCOUNTER — Encounter: Payer: Self-pay | Admitting: Family Medicine

## 2017-04-14 VITALS — BP 132/86 | HR 80 | Ht 65.0 in | Wt 157.4 lb

## 2017-04-14 DIAGNOSIS — Z124 Encounter for screening for malignant neoplasm of cervix: Secondary | ICD-10-CM | POA: Insufficient documentation

## 2017-04-14 DIAGNOSIS — G40909 Epilepsy, unspecified, not intractable, without status epilepticus: Secondary | ICD-10-CM

## 2017-04-14 DIAGNOSIS — E2839 Other primary ovarian failure: Secondary | ICD-10-CM | POA: Diagnosis not present

## 2017-04-14 DIAGNOSIS — Z113 Encounter for screening for infections with a predominantly sexual mode of transmission: Secondary | ICD-10-CM | POA: Insufficient documentation

## 2017-04-14 DIAGNOSIS — Z7189 Other specified counseling: Secondary | ICD-10-CM

## 2017-04-14 DIAGNOSIS — Z1239 Encounter for other screening for malignant neoplasm of breast: Secondary | ICD-10-CM

## 2017-04-14 DIAGNOSIS — Z1159 Encounter for screening for other viral diseases: Secondary | ICD-10-CM | POA: Insufficient documentation

## 2017-04-14 DIAGNOSIS — I10 Essential (primary) hypertension: Secondary | ICD-10-CM | POA: Diagnosis not present

## 2017-04-14 DIAGNOSIS — Z87891 Personal history of nicotine dependence: Secondary | ICD-10-CM

## 2017-04-14 DIAGNOSIS — Z0000 Encounter for general adult medical examination without abnormal findings: Secondary | ICD-10-CM | POA: Insufficient documentation

## 2017-04-14 DIAGNOSIS — Z1231 Encounter for screening mammogram for malignant neoplasm of breast: Secondary | ICD-10-CM | POA: Diagnosis not present

## 2017-04-14 DIAGNOSIS — Z7185 Encounter for immunization safety counseling: Secondary | ICD-10-CM

## 2017-04-14 LAB — POCT URINALYSIS DIP (PROADVANTAGE DEVICE)
BILIRUBIN UA: NEGATIVE
Blood, UA: NEGATIVE
GLUCOSE UA: NEGATIVE mg/dL
Ketones, POC UA: NEGATIVE mg/dL
LEUKOCYTES UA: NEGATIVE
NITRITE UA: NEGATIVE
Protein Ur, POC: NEGATIVE mg/dL
Specific Gravity, Urine: 1.025
Urobilinogen, Ur: NEGATIVE
pH, UA: 6 (ref 5.0–8.0)

## 2017-04-14 NOTE — Patient Instructions (Addendum)
Call and schedule your mammogram and bone density test.   Call [facility] and find out when you are due to follow up.   We will call you with your results.     Preventative Care for Adults - Female      MAINTAIN REGULAR HEALTH EXAMS:  A routine yearly physical is a good way to check in with your primary care provider about your health and preventive screening. It is also an opportunity to share updates about your health and any concerns you have, and receive a thorough all-over exam.   Most health insurance companies pay for at least some preventative services.  Check with your health plan for specific coverages.  WHAT PREVENTATIVE SERVICES DO WOMEN NEED?  Adult women should have their weight and blood pressure checked regularly.   Women age 435 and older should have their cholesterol levels checked regularly.  Women should be screened for cervical cancer with a Pap smear and pelvic exam beginning at either age 59, or 3 years after they become sexually activity.    Breast cancer screening generally begins at age 59 with a mammogram and breast exam by your primary care provider.    Beginning at age 250 and continuing to age 59, women should be screened for colorectal cancer.  Certain people may need continued testing until age 59.  Updating vaccinations is part of preventative care.  Vaccinations help protect against diseases such as the flu.  Osteoporosis is a disease in which the bones lose minerals and strength as we age. Women ages 8965 and over should discuss this with their caregivers, as should women after menopause who have other risk factors.  Lab tests are generally done as part of preventative care to screen for anemia and blood disorders, to screen for problems with the kidneys and liver, to screen for bladder problems, to check blood sugar, and to check your cholesterol level.  Preventative services generally include counseling about diet, exercise, avoiding tobacco, drugs,  excessive alcohol consumption, and sexually transmitted infections.    GENERAL RECOMMENDATIONS FOR GOOD HEALTH:  Healthy diet:  Eat a variety of foods, including fruit, vegetables, animal or vegetable protein, such as meat, fish, chicken, and eggs, or beans, lentils, tofu, and grains, such as rice.  Drink plenty of water daily.  Decrease saturated fat in the diet, avoid lots of red meat, processed foods, sweets, fast foods, and fried foods.  Exercise:  Aerobic exercise helps maintain good heart health. At least 30-40 minutes of moderate-intensity exercise is recommended. For example, a brisk walk that increases your heart rate and breathing. This should be done on most days of the week.   Find a type of exercise or a variety of exercises that you enjoy so that it becomes a part of your daily life.  Examples are running, walking, swimming, water aerobics, and biking.  For motivation and support, explore group exercise such as aerobic class, spin class, Zumba, Yoga,or  martial arts, etc.    Set exercise goals for yourself, such as a certain weight goal, walk or run in a race such as a 5k walk/run.  Speak to your primary care provider about exercise goals.  Disease prevention:  If you smoke or chew tobacco, find out from your caregiver how to quit. It can literally save your life, no matter how long you have been a tobacco user. If you do not use tobacco, never begin.   Maintain a healthy diet and normal weight. Increased weight leads to  problems with blood pressure and diabetes.   The Body Mass Index or BMI is a way of measuring how much of your body is fat. Having a BMI above 27 increases the risk of heart disease, diabetes, hypertension, stroke and other problems related to obesity. Your caregiver can help determine your BMI and based on it develop an exercise and dietary program to help you achieve or maintain this important measurement at a healthful level.  High blood pressure causes  heart and blood vessel problems.  Persistent high blood pressure should be treated with medicine if weight loss and exercise do not work.   Fat and cholesterol leaves deposits in your arteries that can block them. This causes heart disease and vessel disease elsewhere in your body.  If your cholesterol is found to be high, or if you have heart disease or certain other medical conditions, then you may need to have your cholesterol monitored frequently and be treated with medication.   Ask if you should have a cardiac stress test if your history suggests this. A stress test is a test done on a treadmill that looks for heart disease. This test can find disease prior to there being a problem.  Menopause can be associated with physical symptoms and risks. Hormone replacement therapy is available to decrease these. You should talk to your caregiver about whether starting or continuing to take hormones is right for you.   Osteoporosis is a disease in which the bones lose minerals and strength as we age. This can result in serious bone fractures. Risk of osteoporosis can be identified using a bone density scan. Women ages 27 and over should discuss this with their caregivers, as should women after menopause who have other risk factors. Ask your caregiver whether you should be taking a calcium supplement and Vitamin D, to reduce the rate of osteoporosis.   Avoid drinking alcohol in excess (more than two drinks per day).  Avoid use of street drugs. Do not share needles with anyone. Ask for professional help if you need assistance or instructions on stopping the use of alcohol, cigarettes, and/or drugs.  Brush your teeth twice a day with fluoride toothpaste, and floss once a day. Good oral hygiene prevents tooth decay and gum disease. The problems can be painful, unattractive, and can cause other health problems. Visit your dentist for a routine oral and dental check up and preventive care every 6-12 months.    Look at your skin regularly.  Use a mirror to look at your back. Notify your caregivers of changes in moles, especially if there are changes in shapes, colors, a size larger than a pencil eraser, an irregular border, or development of new moles.  Safety:  Use seatbelts 100% of the time, whether driving or as a passenger.  Use safety devices such as hearing protection if you work in environments with loud noise or significant background noise.  Use safety glasses when doing any work that could send debris in to the eyes.  Use a helmet if you ride a bike or motorcycle.  Use appropriate safety gear for contact sports.  Talk to your caregiver about gun safety.  Use sunscreen with a SPF (or skin protection factor) of 15 or greater.  Lighter skinned people are at a greater risk of skin cancer. Don't forget to also wear sunglasses in order to protect your eyes from too much damaging sunlight. Damaging sunlight can accelerate cataract formation.   Practice safe sex. Use condoms. Condoms are used  for birth control and to help reduce the spread of sexually transmitted infections (or STIs).  Some of the STIs are gonorrhea (the clap), chlamydia, syphilis, trichomonas, herpes, HPV (human papilloma virus) and HIV (human immunodeficiency virus) which causes AIDS. The herpes, HIV and HPV are viral illnesses that have no cure. These can result in disability, cancer and death.   Keep carbon monoxide and smoke detectors in your home functioning at all times. Change the batteries every 6 months or use a model that plugs into the wall.   Vaccinations:  Stay up to date with your tetanus shots and other required immunizations. You should have a booster for tetanus every 10 years. Be sure to get your flu shot every year, since 5%-20% of the [place] population comes down with the flu. The flu vaccine changes each year, so being vaccinated once is not enough. Get your shot in the fall, before the flu season peaks.   Other  vaccines to consider:  Human Papilloma Virus or HPV causes cancer of the cervix, and other infections that can be transmitted from person to person. There is a vaccine for HPV, and females should get immunized between the ages of 11 and 63. It requires a series of 3 shots.   Pneumococcal vaccine to protect against certain types of pneumonia.  This is normally recommended for adults age 17 or older.  However, adults younger than 59 years old with certain underlying conditions such as diabetes, heart or lung disease should also receive the vaccine.  Shingles vaccine to protect against Varicella Zoster if you are older than age 63, or younger than 59 years old with certain underlying illness.  Hepatitis A vaccine to protect against a form of infection of the liver by a virus acquired from food.  Hepatitis B vaccine to protect against a form of infection of the liver by a virus acquired from blood or body fluids, particularly if you work in health care.  If you plan to travel internationally, check with your local health department for specific vaccination recommendations.  Cancer Screening:  Breast cancer screening is essential to preventive care for women. All women age 44 and older should perform a breast self-exam every month. At age 36 and older, women should have their caregiver complete a breast exam each year. Women at ages 72 and older should have a mammogram (x-ray film) of the breasts. Your caregiver can discuss how often you need mammograms.    Cervical cancer screening includes taking a Pap smear (sample of cells examined under a microscope) from the cervix (end of the uterus). It also includes testing for HPV (Human Papilloma Virus, which can cause cervical cancer). Screening and a pelvic exam should begin at age 69, or 3 years after a woman becomes sexually active. Screening should occur every year, with a Pap smear but no HPV testing, up to age 31. After age 41, you should have a Pap  smear every 3 years with HPV testing, if no HPV was found previously.   Most routine colon cancer screening begins at the age of 4. On a yearly basis, doctors may provide special easy to use take-home tests to check for hidden blood in the stool. Sigmoidoscopy or colonoscopy can detect the earliest forms of colon cancer and is life saving. These tests use a small camera at the end of a tube to directly examine the colon. Speak to your caregiver about this at age 27, when routine screening begins (and is repeated every 5  years unless early forms of pre-cancerous polyps or small growths are found).

## 2017-04-15 LAB — CBC WITH DIFFERENTIAL/PLATELET
Basophils Absolute: 0 10*3/uL (ref 0.0–0.2)
Basos: 0 %
EOS (ABSOLUTE): 0.1 10*3/uL (ref 0.0–0.4)
EOS: 2 %
HEMATOCRIT: 39.3 % (ref 34.0–46.6)
Hemoglobin: 12.6 g/dL (ref 11.1–15.9)
Immature Grans (Abs): 0 10*3/uL (ref 0.0–0.1)
Immature Granulocytes: 0 %
LYMPHS ABS: 2 10*3/uL (ref 0.7–3.1)
Lymphs: 28 %
MCH: 27.8 pg (ref 26.6–33.0)
MCHC: 32.1 g/dL (ref 31.5–35.7)
MCV: 87 fL (ref 79–97)
MONOS ABS: 0.4 10*3/uL (ref 0.1–0.9)
Monocytes: 5 %
NEUTROS ABS: 4.5 10*3/uL (ref 1.4–7.0)
NEUTROS PCT: 65 %
Platelets: 236 10*3/uL (ref 150–379)
RBC: 4.53 x10E6/uL (ref 3.77–5.28)
RDW: 13.2 % (ref 12.3–15.4)
WBC: 7 10*3/uL (ref 3.4–10.8)

## 2017-04-15 LAB — RPR: RPR Ser Ql: NONREACTIVE

## 2017-04-15 LAB — COMPREHENSIVE METABOLIC PANEL
A/G RATIO: 1.7 (ref 1.2–2.2)
ALT: 14 IU/L (ref 0–32)
AST: 12 IU/L (ref 0–40)
Albumin: 4.5 g/dL (ref 3.5–5.5)
Alkaline Phosphatase: 53 IU/L (ref 39–117)
BILIRUBIN TOTAL: 0.6 mg/dL (ref 0.0–1.2)
BUN/Creatinine Ratio: 19 (ref 9–23)
BUN: 16 mg/dL (ref 6–24)
CHLORIDE: 104 mmol/L (ref 96–106)
CO2: 25 mmol/L (ref 20–29)
Calcium: 9.8 mg/dL (ref 8.7–10.2)
Creatinine, Ser: 0.84 mg/dL (ref 0.57–1.00)
GFR, EST AFRICAN AMERICAN: 89 mL/min/{1.73_m2} (ref >59)
GFR, EST NON AFRICAN AMERICAN: 77 mL/min/{1.73_m2} (ref >59)
GLOBULIN, TOTAL: 2.6 g/dL (ref 1.5–4.5)
Glucose: 111 mg/dL — ABNORMAL HIGH (ref 65–99)
POTASSIUM: 3.9 mmol/L (ref 3.5–5.2)
SODIUM: 142 mmol/L (ref 134–144)
Total Protein: 7.1 g/dL (ref 6.0–8.5)

## 2017-04-15 LAB — TSH: TSH: 0.738 u[IU]/mL (ref 0.450–4.500)

## 2017-04-15 LAB — HIV ANTIBODY (ROUTINE TESTING W REFLEX): HIV Screen 4th Generation wRfx: NONREACTIVE

## 2017-04-15 LAB — HEPATITIS C ANTIBODY: Hep C Virus Ab: <0.1 s/co ratio (ref 0.0–0.9)

## 2017-04-19 LAB — CYTOLOGY - PAP
CHLAMYDIA, DNA PROBE: NEGATIVE
Diagnosis: NEGATIVE
HPV (WINDOPATH): NOT DETECTED
NEISSERIA GONORRHEA: NEGATIVE
TRICH (WINDOWPATH): NEGATIVE

## 2017-04-20 ENCOUNTER — Telehealth: Payer: Self-pay | Admitting: Family Medicine

## 2017-04-20 NOTE — Telephone Encounter (Signed)
Pt thinks she saw Dr. Loreta AveMann.

## 2017-04-20 NOTE — Telephone Encounter (Signed)
Can you ask patient about this and where she may have gone instead?

## 2017-04-20 NOTE — Telephone Encounter (Signed)
Records request for Colonoscopy was sent to [facility] per pt's information. Received a fax from [facility] stated they do not have a pt with that name and dob. Information not available.

## 2017-04-26 ENCOUNTER — Encounter: Payer: Self-pay | Admitting: Family Medicine

## 2017-05-03 NOTE — Progress Notes (Signed)
[facility]  PATIENT: [name] DOB: October 11, 1960   REASON FOR VISIT follow-up for seizure disorder HISTORY FROM: Patient    HISTORY OF PRESENT ILLNESS:UPDATE 2/27/2019CM [name],-year-old female returns for follow-up with history of complex partial seizure disorder.  Last seizure activity September 2013.  She is currently well controlled on Levetiracetam  500 mg twice daily.  She denies side effects to the medication.  No other medical issues since last seen.  She returns for reevaluation UPDATE 11/11/15: Since last visit, doing well. No sz. Patient may be moving to [place]. Otherwise doing well.   UPDATE 05/22/14: Since last visit, doing well. No seizures. Some intermittent eyelid and lip twitching. Int toe spasms. Overall doing well.   UPDATE 05/22/13: Doing well. No seizures since 11/11/11. Now on better schedule of LEV 500mg  BID, without missing doses. 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 This is challenging and has caused her increased stress. Also, her younger brother also diagnosed with seizure disorder now.  UPDATE 11/15/11:  Felt on Thurs (11/11/11) had feelings of having seizure; she felt "out of body".  She was in the car and she had someone to come and pick her up.  She was aware of the situation.  She has missed an average of 3 hs doses/wk of LEV 500mg .  She has had increased stress with her mother's declining health.  Having difficulty with putting thoughts together and repeating words.  She is also having increased anxiety.    UPDATE 01/05/11: Doing well.  Tolerating meds.  Back to driving.  No seizure since Jun 24, 2009.  UPDATE 11/04/09: Doing well; no further seizures.  Having intermittent twitching in toes up to thighs; sometimes bilateral, sometimes unilateral; no triggers.  Still with short temper.  PRIOR HPI (07/22/09): 57 year old right-handed female with hypertension and  atrial fibrillation presenting for evaluation of possible seizure disorder. The patient has had 3 episodes in her life of altered or loss of consciousness.  The first episode occurred in the 1990s when she was a mall and a "strange feeling" with pain over her. She felt as though she was about to pass out. She was able to sit down and did not lose consciousness. She was somewhat confused. Her brother came to pick her up and she did not seek medical attention. The second episode occurred in 2004 when she woke up in the middle of the night feeling very hot. She went to the kitchen and the next thing she remembers is on the floor with her daughter standing over her. She was evaluated in the hospital MRI of the brain revealed a right parietal periventricular focus of gliosis (which I reviewed and agree with report).  Followup MRIs demonstrated stability of this lesion. June 24, 2009 the patient was at the beauty salon, when she all of a sudden she began to profusely sweating. She felt as though she was about to go into the base. In PT she gave her a walker she was able to drink and she ended up going into a "stare" and that her eyes rolled back and she began to shake all over. Unclear what the duration of this event was. She did not get her tongue. He did not have urinary or bowel incontinence. Paramedics were called to the scene and reportedly she had a "weak pulse" (no documentation of this).  She was evaluated at [facility] emergency room with CT scan and MRI of  the brain, which showed stability of the right parietal gliosis (I reviewed images and agree). I discussed case with the emergency room clinicians in the evening patient was started on levetiracetam 500 mg BID. Since discharge she is had no further events. She does have some fatigue and mood swings with this medication. In retrospect patient has been having episodes of dj vu and out of body experience. She denies olfactory hallucinations. She has a family  history of seizure (sister).  She denies any prior head trauma. Denies meningitis or encephalitis. She did well in school (honor roll).  She is not driving presently.   REVIEW OF SYSTEMS: Full 14 system review of systems performed and notable only for those listed, all others are neg:  Constitutional: neg  Cardiovascular: neg Ear/Nose/Throat: neg  Skin: neg Eyes: neg Respiratory: neg Gastroitestinal: neg  Hematology/Lymphatic: neg  Endocrine: neg Musculoskeletal:neg Allergy/Immunology: neg Neurological: History of complex partial seizure disorder Psychiatric: neg Sleep : neg   ALLERGIES: Allergies  Allergen Reactions  . Penicillins Hives and Rash    Has patient had a PCN reaction causing immediate rash, facial/tongue/throat swelling, SOB or lightheadedness with hypotension: yes Has patient had a PCN reaction causing severe rash involving mucus membranes or skin necrosis: No Has patient had a PCN reaction that required hospitalization No Has patient had a PCN reaction occurring within the last 10 years: No If all of the above answers are "NO", then may proceed with Cephalosporin use.     HOME MEDICATIONS: Outpatient Medications Prior to Visit  Medication Sig Dispense Refill  . ALPRAZolam (XANAX) 0.5 MG tablet Take 0.5 mg by mouth 2 (two) times daily as needed (muscle relaxer).     Marland Kitchen amLODipine (NORVASC) 5 MG tablet TAKE 1 TABLET BY MOUTH ONCE DAILY 90 tablet 1  . aspirin 325 MG tablet Take 325 mg by mouth daily.    . carvedilol (COREG) 12.5 MG tablet Take 1 tablet (12.5 mg total) by mouth 2 (two) times daily with a meal. 60 tablet 0  . diazepam (VALIUM) 5 MG tablet diazepam 5 mg tablet    . ferrous sulfate 325 (65 FE) MG EC tablet Take 325 mg by mouth daily.    Marland Kitchen ibuprofen (ADVIL,MOTRIN) 800 MG tablet Take 800 mg by mouth every 8 (eight) hours as needed.    . levETIRAcetam (KEPPRA) 500 MG tablet Take 1 tablet (500 mg total) by mouth 2 (two) times daily. 60 tablet 3  .  losartan-hydrochlorothiazide (HYZAAR) 100-25 MG tablet Take 1 tablet by mouth daily. 30 tablet 11  . Multiple Vitamins-Minerals (MULTIVITAMIN WITH MINERALS) tablet Take 1 tablet by mouth daily.    . naproxen (NAPROSYN) 500 MG tablet Take 500 mg by mouth 2 (two) times daily with a meal.    . potassium chloride SA (K-DUR,KLOR-CON) 20 MEQ tablet Take 1 tablet (20 mEq total) by mouth 2 (two) times daily. 60 tablet 3   No facility-administered medications prior to visit.     PAST MEDICAL HISTORY: Past Medical History:  Diagnosis Date  . Atrial fibrillation (HCC)   . Back pain   . History of echocardiogram    Echo 5/17 - mild concentric LVH, vigorous LVEF, EF 65-70%, no RWMA, Gr 1 DD, normal RVEF, normal bilat atrial size  . Hypertension   . Seizures (HCC)    around 2014    PAST SURGICAL HISTORY: Past Surgical History:  Procedure Laterality Date  . CESAREAN SECTION      FAMILY HISTORY: Family History  Problem Relation  Age of Onset  . Sickle cell trait Sister   . Hypertension Brother   . Sickle cell anemia Mother   . Hypertension Mother   . Heart attack Mother [id number]  . Seizures Sister   . Sickle cell trait Sister   . Hypertension Brother   . Hypertension Brother   . Hypertension Son     SOCIAL HISTORY: Social History   Socioeconomic History  . Marital status: Single    Spouse name: Minerva Areolaric  . Number of children: 2  . Years of education: College  . Highest education level: Not on file  Social Needs  . Financial resource strain: Not on file  . Food insecurity - worry: Not on file  . Food insecurity - inability: Not on file  . Transportation needs - medical: Not on file  . Transportation needs - non-medical: Not on file  Occupational History  . Occupation: health care billing specialist    Employer: OTHER    Comment: [employer]  Tobacco Use  . Smoking status: Former Smoker    Packs/day: 0.75    Years: 37.00    Pack years: 27.75    Types: Cigarettes    Last attempt to  quit: 10/12/2016    Years since quitting: 0.5  . Smokeless tobacco: Never Used  Substance and Sexual Activity  . Alcohol use: Yes    Alcohol/week: 0.0 oz    Comment: ocassionally on weekends   . Drug use: No  . Sexual activity: Yes    Partners: Male    Birth control/protection: Post-menopausal, Surgical    Comment: one partner since 2003  Other Topics Concern  . Not on file  Social History Narrative   Patient lives with her daughter.   Her son lives in [place], independently.   Caffeine Use: 2-3 cups daily Coffee     PHYSICAL EXAM  Vitals:   05/04/17 0739  BP: (!) 147/90  Pulse: 74  Weight: 159 lb 6.4 oz (72.3 kg)  Height: 5\' 5"  (1.651 m)   Body mass index is 26.53 kg/m.  Generalized: Well developed, in no acute distress  Head: normocephalic and atraumatic,. Oropharynx benign  Neck: Supple,  Musculoskeletal: No deformity   Neurological examination   Mentation: Alert oriented to time, place, history taking. Attention span and concentration appropriate. Recent and remote memory intact.  Follows all commands speech and language fluent.   Cranial nerve II-XII: Pupils were equal round reactive to light extraocular movements were full, visual field were full on confrontational test. Facial sensation and strength were normal. hearing was intact to finger rubbing bilaterally. Uvula tongue midline. head turning and shoulder shrug were normal and symmetric.Tongue protrusion into cheek strength was normal. Motor: normal bulk and tone, full strength in the BUE, BLE, fine finger movements normal, no pronator drift.  Sensory: normal and symmetric to light touch, Coordination: finger-nose-finger, heel-to-shin bilaterally, no dysmetria Reflexes: Symmetric upper and lower, plantar responses were flexor bilaterally. Gait and Station: Rising up from seated position without assistance, normal stance,  moderate stride, good arm swing, smooth turning, able to perform tiptoe, and heel  walking without difficulty. Tandem gait is steady  DIAGNOSTIC DATA (LABS, IMAGING, TESTING) - I reviewed patient records, labs, notes, testing and imaging myself where available.  Lab Results  Component Value Date   WBC 7.0 04/14/2017   HGB 12.6 04/14/2017   HCT 39.3 04/14/2017   MCV 87 04/14/2017   PLT 236 04/14/2017      Component Value Date/Time   NA 142 04/14/2017  0930   K 3.9 04/14/2017 0930   CL 104 04/14/2017 0930   CO2 25 04/14/2017 0930   GLUCOSE 111 (H) 04/14/2017 0930   GLUCOSE 102 (H) 09/22/2016 1314   BUN 16 04/14/2017 0930   CREATININE 0.84 04/14/2017 0930   CREATININE 0.83 06/23/2015 1233   CALCIUM 9.8 04/14/2017 0930   PROT 7.1 04/14/2017 0930   ALBUMIN 4.5 04/14/2017 0930   AST 12 04/14/2017 0930   ALT 14 04/14/2017 0930   ALKPHOS 53 04/14/2017 0930   BILITOT 0.6 04/14/2017 0930   GFRNONAA 77 04/14/2017 0930   GFRAA 89 04/14/2017 0930     Lab Results  Component Value Date   TSH 0.738 04/14/2017      ASSESSMENT AND PLAN  57 y.o. year old female  Hypertension, and Seizures (complex partial with secondary generalization; possibly related to right parietal non-specific gliosis but not clear).  History of paroxysmal atrial fibrillation treated with aspirin followed by Dr. Excell Seltzer   Continue Levetiracetam 500 mg twice daily will refill Call for any seizure activity Follow seizure precautions, driving safety, tapering of meds in the future if necessary Follow-up yearly and as needed Nilda Riggs, [facility], [facility], APRN  [facility] [address], [address] [place], [place] [address] [phone]

## 2017-05-04 ENCOUNTER — Ambulatory Visit: Payer: Managed Care, Other (non HMO) | Admitting: Nurse Practitioner

## 2017-05-04 ENCOUNTER — Encounter: Payer: Self-pay | Admitting: Nurse Practitioner

## 2017-05-04 VITALS — BP 147/90 | HR 74 | Ht 65.0 in | Wt 159.4 lb

## 2017-05-04 DIAGNOSIS — G40909 Epilepsy, unspecified, not intractable, without status epilepticus: Secondary | ICD-10-CM

## 2017-05-04 NOTE — Patient Instructions (Signed)
Continue Levetiracetam 500 mg twice daily Call for any seizure activity Follow seizure precautions Follow-up yearly and as needed

## 2017-05-10 NOTE — Progress Notes (Signed)
I reviewed note and agree with plan.   VIKRAM R. PENUMALLI, MD 05/10/2017, 6:31 PM Certified in Neurology, Neurophysiology and Neuroimaging  [facility] [address], [address] [place], [place] [address] [phone]  

## 2017-05-18 ENCOUNTER — Ambulatory Visit: Payer: Self-pay

## 2017-05-18 ENCOUNTER — Ambulatory Visit (HOSPITAL_COMMUNITY)
Admission: EM | Admit: 2017-05-18 | Discharge: 2017-05-18 | Disposition: A | Discharge status: Home / Self Care | Payer: Managed Care, Other (non HMO) | Attending: Family Medicine | Admitting: Family Medicine

## 2017-05-18 ENCOUNTER — Other Ambulatory Visit: Payer: Self-pay

## 2017-05-18 ENCOUNTER — Encounter (HOSPITAL_COMMUNITY): Payer: Self-pay | Admitting: Emergency Medicine

## 2017-05-18 ENCOUNTER — Inpatient Hospital Stay: Admission: RE | Admit: 2017-05-18 | Payer: Self-pay | Source: Ambulatory Visit

## 2017-05-18 DIAGNOSIS — B9789 Other viral agents as the cause of diseases classified elsewhere: Secondary | ICD-10-CM | POA: Diagnosis not present

## 2017-05-18 DIAGNOSIS — R6889 Other general symptoms and signs: Secondary | ICD-10-CM | POA: Diagnosis not present

## 2017-05-18 DIAGNOSIS — J069 Acute upper respiratory infection, unspecified: Secondary | ICD-10-CM | POA: Diagnosis not present

## 2017-05-18 MED ORDER — PREDNISONE 20 MG PO TABS: ORAL_TABLET | ORAL | 0 refills | Status: DC

## 2017-05-18 MED ORDER — HYDROCOD POLST-CPM POLST ER 10-8 MG/5ML PO SUER: 5.0000 mL | Freq: Two times a day (BID) | ORAL | 0 refills | Status: DC | PRN

## 2017-05-18 NOTE — ED Provider Notes (Signed)
[facility] CARE CENTER   [id number] 05/18/17 Arrival Time: 1003   SUBJECTIVE:  [name] is a 57 y.o. female who presents to the urgent care with complaint of chills, body aches, coughing, bilateral "ear itching". Pt c/o nasal congestion.  Patient works for [employer]. She isn't a smoker having quit cigarettes 6 months ago.  Her symptoms began 3 days ago with runny nose, myalgia, and cough. Her cough is persisting and keeping her awake. Past Medical History:  Diagnosis Date  . Atrial fibrillation (HCC)   . Back pain   . History of echocardiogram    Echo 5/17 - mild concentric LVH, vigorous LVEF, EF 65-70%, no RWMA, Gr 1 DD, normal RVEF, normal bilat atrial size  . Hypertension   . Seizures (HCC)    around 2014   Family History  Problem Relation Age of Onset  . Sickle cell trait Sister   . Hypertension Brother   . Sickle cell anemia Mother   . Hypertension Mother   . Heart attack Mother 45  . Seizures Sister   . Sickle cell trait Sister   . Hypertension Brother   . Hypertension Brother   . Hypertension Son    Social History   Socioeconomic History  . Marital status: Single    Spouse name: Minerva Areola  . Number of children: 2  . Years of education: College  . Highest education level: Not on file  Social Needs  . Financial resource strain: Not on file  . Food insecurity - worry: Not on file  . Food insecurity - inability: Not on file  . Transportation needs - medical: Not on file  . Transportation needs - non-medical: Not on file  Occupational History  . Occupation: health care billing specialist    Employer: OTHER    Comment: [employer]  Tobacco Use  . Smoking status: Former Smoker    Packs/day: 0.75    Years: 37.00    Pack years: 27.75    Types: Cigarettes    Last attempt to quit: 10/12/2016    Years since quitting: 0.5  . Smokeless tobacco: Never Used  Substance and Sexual Activity  . Alcohol use: Yes    Alcohol/week: 0.0 oz    Comment: ocassionally on weekends    . Drug use: No  . Sexual activity: Yes    Partners: Male    Birth control/protection: Post-menopausal, Surgical    Comment: one partner since 2003  Other Topics Concern  . Not on file  Social History Narrative   Patient lives with her daughter.   Her son lives in [place], independently.   Caffeine Use: 2-3 cups daily Coffee   No outpatient medications have been marked as taking for the 05/18/17 encounter [facility] Encounter).   Allergies  Allergen Reactions  . Penicillins Hives and Rash    Has patient had a PCN reaction causing immediate rash, facial/tongue/throat swelling, SOB or lightheadedness with hypotension: yes Has patient had a PCN reaction causing severe rash involving mucus membranes or skin necrosis: No Has patient had a PCN reaction that required hospitalization No Has patient had a PCN reaction occurring within the last 10 years: No If all of the above answers are "NO", then may proceed with Cephalosporin use.       ROS: As per HPI, remainder of ROS negative.   OBJECTIVE:   Vitals:   05/18/17 1027  BP: (!) 140/99  Pulse: 66  Resp: 18  Temp: 98.4 F (36.9 C)  SpO2: 100%  General appearance: alert; no distress Eyes: PERRL; EOMI; conjunctiva normal HENT: normocephalic; atraumatic; TMs normal, canal normal, external ears normal without trauma; nasal mucosa normal; oral mucosa normal Neck: supple Lungs: ronchi auscultation bilaterally Back: no CVA tenderness Extremities: no cyanosis or edema; symmetrical with no gross deformities Skin: warm and dry Neurologic: normal gait; grossly normal Psychological: alert and cooperative; normal mood and affect      Labs:  Results for orders placed or performed in visit on 04/26/17  HM COLONOSCOPY  Result Value Ref Range   HM Colonoscopy See Report (in chart) See Report (in chart), Patient Reported    Labs Reviewed - No data to display  No results found.     ASSESSMENT & PLAN:  1. Viral URI  with cough   2. Flu-like symptoms     Meds ordered this encounter  Medications  . chlorpheniramine-HYDROcodone (TUSSIONEX PENNKINETIC ER) 10-8 MG/5ML SUER    Sig: Take 5 mLs by mouth every 12 (twelve) hours as needed for cough.    Dispense:  80 mL    Refill:  0  . predniSONE (DELTASONE) 20 MG tablet    Sig: Two daily with food    Dispense:  10 tablet    Refill:  0    Reviewed expectations re: course of current medical issues. Questions answered. Outlined signs and symptoms indicating need for more acute intervention. Patient verbalized understanding. After Visit Summary given.    Procedures:      Elvina SidleLauenstein, Makhiya Coburn, MD 05/18/17 1037

## 2017-05-18 NOTE — ED Triage Notes (Signed)
Pt c/o off and on chills, body aches, coughing, bilateral "ear itching". Pt c/o nasal congestion.

## 2017-07-19 ENCOUNTER — Encounter: Payer: Self-pay | Admitting: Family Medicine

## 2017-07-19 ENCOUNTER — Telehealth: Payer: Self-pay | Admitting: Nurse Practitioner

## 2017-07-19 ENCOUNTER — Ambulatory Visit: Payer: Managed Care, Other (non HMO) | Admitting: Family Medicine

## 2017-07-19 VITALS — BP 146/80 | HR 80 | Temp 98.2°F | Wt 157.2 lb

## 2017-07-19 DIAGNOSIS — M546 Pain in thoracic spine: Secondary | ICD-10-CM | POA: Diagnosis not present

## 2017-07-19 MED ORDER — LEVETIRACETAM 500 MG PO TABS: 500.0000 mg | ORAL_TABLET | Freq: Two times a day (BID) | ORAL | 1 refills | Status: DC

## 2017-07-19 MED ORDER — KETOROLAC TROMETHAMINE 60 MG/2ML IM SOLN: 60.0000 mg | Freq: Once | INTRAMUSCULAR | 0 refills | Status: AC

## 2017-07-19 MED ORDER — HYDROCODONE-ACETAMINOPHEN 5-325 MG PO TABS
1.0000 | ORAL_TABLET | Freq: Four times a day (QID) | ORAL | 0 refills | Status: DC | PRN
Start: 2017-07-19 — End: 2018-03-24

## 2017-07-19 NOTE — Telephone Encounter (Signed)
Keppra refilled 90 day supply, 1 refill to [facility] as patient requested. She has FU scheduled.

## 2017-07-19 NOTE — Telephone Encounter (Signed)
Pt called she needs new RX for levETIRAcetam (KEPPRA) 500 MG tablet 90 day supply sent to [facility]. It must be 90 day supply for insurance to pay.

## 2017-07-19 NOTE — Progress Notes (Signed)
   Subjective:    Patient ID: [name], female    DOB: Mar 07, 1961, 57 y.o.   MRN: [id number]  HPI She is here for evaluation of back pain.  Apparently this is from a old injury of several years ago.  She will occasionally have back pain.  The present pain is on the right-hand side.  Is made worse with breathing and movement.  She was apparently seen in the emergency room and the winter for this.  She recently saw Dr. Shon Baton but not see him for this particular problem.  They did call in a muscle relaxant which apparently has not worked.   Review of Systems     Objective:   Physical Exam Alert and crying in pain with minimal motion.  Tender to palpation in the right paravertebral muscles.  Full evaluation was difficult to do because she was in a wheelchair.       Assessment & Plan:  Right-sided thoracic back pain, unspecified chronicity  The pain now seems to be mainly musculoskeletal and on the right-hand side.  I will give her Toradol and codeine.  Recommend she follow-up with Dr. Shon Baton concerning this.

## 2017-08-26 ENCOUNTER — Telehealth: Payer: Self-pay | Admitting: Nurse Practitioner

## 2017-08-26 NOTE — Telephone Encounter (Signed)
Pt called Dr Lang SnowSpivey/pain management has prescribed baclofen 5mg  #30. She read that a side effect can be seizures and with having epilepsey is this a safe medication to take. It has been filled but she is not going to pick it up until she gets an answer. Please call today

## 2017-08-26 NOTE — Telephone Encounter (Signed)
Spoke with patient and advised her Dr Merceda ElksPenumallli stated it is okay to take Baclofen. Asked her if it is to be taken regularly or as needed. She stated she isn't sure because she hasn't picked it up yet. She stated she called to discuss it with pain management dr, and he told her to check with this office. Again this RN repeated Dr Richrd HumblesPenumalli's reply but stated it is ultimately her decision. She verbalized understanding, appreciation.

## 2017-08-26 NOTE — Telephone Encounter (Signed)
Ok to take baclofen. -VRP

## 2017-08-30 ENCOUNTER — Telehealth: Payer: Self-pay | Admitting: Cardiovascular Disease

## 2017-08-30 NOTE — Telephone Encounter (Signed)
New Message:       Patient c/o Palpitations:  High priority if patient c/o lightheadedness, shortness of breath, or chest pain  1) How long have you had palpitations/irregular HR/ Afib? Are you having the symptoms now? Week and a half Afib issues  2) Are you currently experiencing lightheadedness, SOB or CP? No  3) Do you have a history of afib (atrial fibrillation) or irregular heart rhythm? afib  4) Have you checked your BP or HR? (document readings if available): No  5) Are you experiencing any other symptoms? Fatigue   Pt has an appt on the 10/13/17 with Bhagat but would like to see if she can get in a whole lot sooner

## 2017-08-30 NOTE — Telephone Encounter (Signed)
Patient reports for the last week and a half she has been going in and out of afib. It's annoying her now though and it's making her really tired so she decided to call today. She does not have vital signs to report, but says she knows she is going in and out of afib. She says her HR is at a normal rate most of the time, but she can occasionally feel fluttering. She also reports some slight shortness of breath. She is taking medications as directed. She is currently scheduled to see Vin in August. Rescheduled her to see Dr. Excell Seltzerooper 6/27. She understands to proceed to ER if her heart rate starts racing or if SOB worsens. She was grateful for assistance.

## 2017-09-01 ENCOUNTER — Encounter: Payer: Self-pay | Admitting: Cardiovascular Disease

## 2017-09-01 ENCOUNTER — Ambulatory Visit: Payer: Managed Care, Other (non HMO) | Admitting: Cardiovascular Disease

## 2017-09-01 VITALS — BP 124/74 | HR 65 | Ht 64.0 in | Wt 161.0 lb

## 2017-09-01 DIAGNOSIS — R0602 Shortness of breath: Secondary | ICD-10-CM | POA: Diagnosis not present

## 2017-09-01 DIAGNOSIS — R002 Palpitations: Secondary | ICD-10-CM

## 2017-09-01 DIAGNOSIS — I480 Paroxysmal atrial fibrillation: Secondary | ICD-10-CM | POA: Diagnosis not present

## 2017-09-01 MED ORDER — ASPIRIN EC 81 MG PO TBEC: 81.0000 mg | DELAYED_RELEASE_TABLET | Freq: Every day | ORAL | 3 refills | Status: DC

## 2017-09-01 NOTE — Progress Notes (Signed)
Cardiology Office Note Date:  09/01/2017   ID:  [name], DOB 11-14-1960, MRN [id number]  PCP:  Avanell Shackleton, NP-C  Cardiologist:  Tonny Bollman, MD    Chief Complaint  Patient presents with  . Shortness of Breath  . Palpitations     History of Present Illness: [name] is a 57 y.o. female who presents for follow-up of atrial fibrillation.  The patient has a remote history of atrial fibrillation with no evidence of this over recent years.  She has never been anticoagulated.  The patient has a seizure disorder.  Last year she was seen after undergoing emergency room evaluation for severe hypertension and headache.  Other medical problems include tobacco use and hypokalemia.  She is here alone today. Complains of heart palpitations over the past 5-7 days. These are associated with shortness of breath. She's had this now several times/day. Symptoms can arise at rest or with activity. States it feels like 'butterflies' in her chest.  She drinks approximately 2 cups of coffee each day.  She quit smoking cigarettes 3 weeks ago.  She denies chest pain, lightheadedness, syncope, or leg swelling.  Past Medical History:  Diagnosis Date  . Atrial fibrillation (HCC)   . Back pain   . History of echocardiogram    Echo 5/17 - mild concentric LVH, vigorous LVEF, EF 65-70%, no RWMA, Gr 1 DD, normal RVEF, normal bilat atrial size  . Hypertension   . Seizures (HCC)    around 2014    Past Surgical History:  Procedure Laterality Date  . CESAREAN SECTION      Current Outpatient Medications  Medication Sig Dispense Refill  . ALPRAZolam (XANAX) 0.5 MG tablet Take 0.5 mg by mouth 2 (two) times daily as needed (muscle relaxer).     Marland Kitchen amLODipine (NORVASC) 5 MG tablet TAKE 1 TABLET BY MOUTH ONCE DAILY 90 tablet 1  . carvedilol (COREG) 12.5 MG tablet Take 1 tablet (12.5 mg total) by mouth 2 (two) times daily with a meal. 60 tablet 0  . chlorpheniramine-HYDROcodone (TUSSIONEX PENNKINETIC  ER) 10-8 MG/5ML SUER Take 5 mLs by mouth every 12 (twelve) hours as needed for cough. 80 mL 0  . cyclobenzaprine (FLEXERIL) 10 MG tablet Take 10 mg by mouth 2 (two) times daily.    . diazepam (VALIUM) 5 MG tablet diazepam 5 mg tablet    . ferrous sulfate 325 (65 FE) MG EC tablet Take 325 mg by mouth daily.    Marland Kitchen HYDROcodone-acetaminophen (NORCO) 5-325 MG tablet Take 1 tablet by mouth every 6 (six) hours as needed for moderate pain. 20 tablet 0  . ibuprofen (ADVIL,MOTRIN) 800 MG tablet Take 800 mg by mouth every 8 (eight) hours as needed.    . levETIRAcetam (KEPPRA) 500 MG tablet Take 1 tablet (500 mg total) by mouth 2 (two) times daily. 180 tablet 1  . losartan-hydrochlorothiazide (HYZAAR) 100-25 MG tablet Take 1 tablet by mouth daily. 30 tablet 11  . Multiple Vitamins-Minerals (MULTIVITAMIN WITH MINERALS) tablet Take 1 tablet by mouth daily.    . naproxen (NAPROSYN) 500 MG tablet Take 500 mg by mouth 2 (two) times daily with a meal.    . potassium chloride SA (K-DUR,KLOR-CON) 20 MEQ tablet Take 1 tablet (20 mEq total) by mouth 2 (two) times daily. 60 tablet 3  . predniSONE (DELTASONE) 20 MG tablet Two daily with food 10 tablet 0  . aspirin EC 81 MG tablet Take 1 tablet (81 mg total) by mouth daily. 90 tablet 3  No current facility-administered medications for this visit.     Allergies:   Penicillins   Social History:  The patient  reports that she quit smoking about 10 months ago. Her smoking use included cigarettes. She has a 27.75 pack-year smoking history. She has never used smokeless tobacco. She reports that she drinks alcohol. She reports that she does not use drugs.   Family History:  The patient's family history includes Heart attack (age of onset: 5683) in her mother; Hypertension in her brother, brother, brother, mother, and son; Seizures in her sister; Sickle cell anemia in her mother; Sickle cell trait in her sister and sister.    ROS:  Please see the history of present illness.     All other systems are reviewed and negative.    PHYSICAL EXAM: VS:  BP 124/74   Pulse 65   Ht 5\' 4"  (1.626 m)   Wt 161 lb (73 kg)   BMI 27.64 kg/m  , BMI Body mass index is 27.64 kg/m. GEN: Well nourished, well developed, in no acute distress  HEENT: normal  Neck: no JVD, no masses. No carotid bruits Cardiac: RRR with grade 2/6 early peaking systolic ejection murmur at the left lower sternal border               Respiratory:  clear to auscultation bilaterally, normal work of breathing GI: soft, nontender, nondistended, + BS MS: no deformity or atrophy  Ext: no pretibial edema, pedal pulses 2+= bilaterally Skin: warm and dry, no rash Neuro:  Strength and sensation are intact Psych: euthymic mood, full affect  EKG:  EKG is ordered today. The ekg ordered today shows normal sinus rhythm 65 bpm, minimal voltage criteria for LVH may be normal variant.  Recent Labs: 04/14/2017: ALT 14; BUN 16; Creatinine, Ser 0.84; Hemoglobin 12.6; Platelets 236; Potassium 3.9; Sodium 142; TSH 0.738   Lipid Panel  No results found for: CHOL, TRIG, HDL, CHOLHDL, VLDL, LDLCALC, LDLDIRECT    Wt Readings from Last 3 Encounters:  09/01/17 161 lb (73 kg)  07/19/17 157 lb 3.2 oz (71.3 kg)  05/04/17 159 lb 6.4 oz (72.3 kg)     ASSESSMENT AND PLAN: 1.  Paroxysmal atrial fibrillation with heart palpitations: Chads-vascular score equals 2 for female gender and hypertension.  However, she has had no documented atrial fibrillation in a long time.  She has never been anticoagulated.  Considering her lack of any documented atrial fibrillation over several years, I have not recommended anticoagulation.  She is experiencing palpitations on a daily basis.  We discussed reduction in caffeine intake, good sleep hygiene, and adequate fluid hydration.  We will check a 48-hour Holter monitor to evaluate her heart rhythm and an echocardiogram.  During my exam today she was experiencing heart palpitations and her rhythm was  regular, clearly normal sinus as documented on her EKG..  2.  Hypertension: Blood pressure is well controlled she quit smoking 3 weeks ago.  3.  Tobacco abuse: Encouraged to continue to abstain from cigarettes.  Current medicines are reviewed with the patient today.  The patient does not have concerns regarding medicines.  Labs/ tests ordered today include:   Orders Placed This Encounter  Procedures  . HOLTER MONITOR - 48 HOUR  . EKG 12-Lead  . ECHOCARDIOGRAM COMPLETE   Disposition:   FU one year  Signed, Tonny BollmanMichael Nirvi Boehler, MD  09/01/2017 5:16 PM    [facility] [address], [place], [place]  [address] Phone: [phone];  Fax: [phone]  

## 2017-09-01 NOTE — Patient Instructions (Signed)
Medication Instructions:  1) DECREASE Aspirin to 81mg  once daily  Labwork: None  Testing/Procedures: Your physician has requested that you have an echocardiogram. Echocardiography is a painless test that uses sound waves to create images of your heart. It provides your doctor with information about the size and shape of your heart and how well your heart's chambers and valves are working. This procedure takes approximately one hour. There are no restrictions for this procedure.  Your physician has recommended that you wear a 48 hour holter monitor. Holter monitors are medical devices that record the heart's electrical activity. Doctors most often use these monitors to diagnose arrhythmias. Arrhythmias are problems with the speed or rhythm of the heartbeat. The monitor is a small, portable device. You can wear one while you do your normal daily activities. This is usually used to diagnose what is causing palpitations/syncope (passing out).    Follow-Up: Your physician recommends that you schedule a follow-up appointment in: 6 months with a PA or NP.  Your physician wants you to follow-up in: 1 year with Dr. Excell Seltzerooper.  You will receive a reminder letter in the mail two months in advance. If you don't receive a letter, please call our office to schedule the follow-up appointment.    Any Other Special Instructions Will Be Listed Below (If Applicable).     If you need a refill on your cardiac medications before your next appointment, please call your pharmacy.

## 2017-09-06 ENCOUNTER — Other Ambulatory Visit: Payer: Self-pay

## 2017-09-06 ENCOUNTER — Ambulatory Visit (HOSPITAL_COMMUNITY): Payer: Managed Care, Other (non HMO) | Attending: Cardiovascular Disease

## 2017-09-06 DIAGNOSIS — R002 Palpitations: Secondary | ICD-10-CM

## 2017-09-06 DIAGNOSIS — I4891 Unspecified atrial fibrillation: Secondary | ICD-10-CM | POA: Diagnosis not present

## 2017-09-06 DIAGNOSIS — Z8249 Family history of ischemic heart disease and other diseases of the circulatory system: Secondary | ICD-10-CM | POA: Diagnosis not present

## 2017-09-06 DIAGNOSIS — R0600 Dyspnea, unspecified: Secondary | ICD-10-CM | POA: Insufficient documentation

## 2017-09-06 DIAGNOSIS — I10 Essential (primary) hypertension: Secondary | ICD-10-CM | POA: Diagnosis not present

## 2017-09-06 DIAGNOSIS — R0602 Shortness of breath: Secondary | ICD-10-CM | POA: Diagnosis not present

## 2017-09-06 DIAGNOSIS — Z87891 Personal history of nicotine dependence: Secondary | ICD-10-CM | POA: Insufficient documentation

## 2017-09-09 ENCOUNTER — Ambulatory Visit (INDEPENDENT_AMBULATORY_CARE_PROVIDER_SITE_OTHER): Payer: Managed Care, Other (non HMO)

## 2017-09-09 DIAGNOSIS — R002 Palpitations: Secondary | ICD-10-CM | POA: Diagnosis not present

## 2017-09-22 ENCOUNTER — Telehealth: Payer: Self-pay | Admitting: Cardiovascular Disease

## 2017-09-22 NOTE — Telephone Encounter (Signed)
New message   Pt states she has been waiting on her holter monitor results

## 2017-09-22 NOTE — Telephone Encounter (Signed)
Informed patient of holter monitor results.  She verbalized understanding.

## 2017-09-24 ENCOUNTER — Other Ambulatory Visit: Payer: Self-pay | Admitting: Cardiovascular Disease

## 2017-10-13 ENCOUNTER — Ambulatory Visit: Payer: Self-pay | Admitting: Physician Assistant

## 2017-11-10 ENCOUNTER — Emergency Department (HOSPITAL_COMMUNITY)
Admission: EM | Admit: 2017-11-10 | Discharge: 2017-11-10 | Disposition: A | Discharge status: Home / Self Care | Payer: Managed Care, Other (non HMO) | Attending: Emergency Medicine | Admitting: Emergency Medicine

## 2017-11-10 ENCOUNTER — Encounter (HOSPITAL_COMMUNITY): Payer: Self-pay | Admitting: *Deleted

## 2017-11-10 DIAGNOSIS — Z87891 Personal history of nicotine dependence: Secondary | ICD-10-CM | POA: Insufficient documentation

## 2017-11-10 DIAGNOSIS — Z79899 Other long term (current) drug therapy: Secondary | ICD-10-CM | POA: Diagnosis not present

## 2017-11-10 DIAGNOSIS — I10 Essential (primary) hypertension: Secondary | ICD-10-CM | POA: Diagnosis not present

## 2017-11-10 DIAGNOSIS — D573 Sickle-cell trait: Secondary | ICD-10-CM | POA: Diagnosis not present

## 2017-11-10 DIAGNOSIS — I4891 Unspecified atrial fibrillation: Secondary | ICD-10-CM | POA: Insufficient documentation

## 2017-11-10 DIAGNOSIS — G8929 Other chronic pain: Secondary | ICD-10-CM | POA: Insufficient documentation

## 2017-11-10 DIAGNOSIS — M546 Pain in thoracic spine: Secondary | ICD-10-CM | POA: Insufficient documentation

## 2017-11-10 MED ORDER — DIAZEPAM 2 MG PO TABS
2.0000 mg | ORAL_TABLET | Freq: Once | ORAL | Status: AC
  Administered 2017-11-10: 2 mg via ORAL
  Filled 2017-11-10: qty 1

## 2017-11-10 MED ORDER — OXYCODONE-ACETAMINOPHEN 5-325 MG PO TABS
1.0000 | ORAL_TABLET | Freq: Once | ORAL | Status: AC
  Administered 2017-11-10: 1 via ORAL
  Filled 2017-11-10: qty 1

## 2017-11-10 NOTE — Discharge Instructions (Addendum)
Start your home medications again and call you doctor for follow up.

## 2017-11-10 NOTE — ED Triage Notes (Signed)
Pt complains of back pain x 3 days. Pain is worse with movement. Pt took hydrocodone at 0330 today, states pain is not better.

## 2017-11-10 NOTE — ED Provider Notes (Signed)
[facility]-EMERGENCY DEPT Provider Note   CSN: [id number] Arrival date & time: 11/10/17  1237     History   Chief Complaint Chief Complaint  Patient presents with  . Back Pain    HPI [name] is a 57 y.o. female with hx of back problems and sees Dr. Jordan Likes for injections. She called the office today but her doctor was not in. She was told to come to the ED for pain management. The pain started 3 days ago and has gotten worse. Patient took hydrocodone at 3 am without relief.   The history is provided by the patient. No language interpreter was used.  Back Pain   This is a chronic problem. The current episode started more than 2 days ago. The problem occurs constantly. The pain is present in the thoracic spine. The quality of the pain is described as stabbing and burning. The pain is at a severity of 9/10. The symptoms are aggravated by bending, twisting and certain positions. Pertinent negatives include no fever, no headaches, no abdominal pain, no bowel incontinence, no bladder incontinence and no dysuria. She has tried NSAIDs for the symptoms.    Past Medical History:  Diagnosis Date  . Atrial fibrillation (HCC)   . Back pain   . History of echocardiogram    Echo 5/17 - mild concentric LVH, vigorous LVEF, EF 65-70%, no RWMA, Gr 1 DD, normal RVEF, normal bilat atrial size  . Hypertension   . Seizures (HCC)    around 2014    Patient Active Problem List   Diagnosis Date Noted  . Seizure disorder (HCC) 05/22/2013  . SICKLE CELL TRAIT 11/05/2008  . SYNCOPE, HX OF 11/05/2008  . PAF (paroxysmal atrial fibrillation) (HCC) 11/05/2008  . FIBROCYSTIC BREAST DISEASE, HX OF 11/05/2008  . ANEMIA, OTHER, UNSPECIFIED 05/05/2006  . Tobacco abuse 05/05/2006  . TENSION HEADACHE 05/05/2006  . Essential hypertension, benign 05/05/2006  . GASTROESOPHAGEAL REFLUX, NO ESOPHAGITIS 05/05/2006    Past Surgical History:  Procedure Laterality Date  . CESAREAN SECTION        OB History   None      Home Medications    Prior to Admission medications   Medication Sig Start Date End Date Taking? Authorizing Provider  ALPRAZolam Prudy Feeler) 0.5 MG tablet Take 0.5 mg by mouth 2 (two) times daily as needed (muscle relaxer).     [provider]  amLODipine (NORVASC) 5 MG tablet TAKE 1 TABLET BY MOUTH EVERY DAY 09/27/17   Tonny Bollman, MD  aspirin EC 81 MG tablet Take 1 tablet (81 mg total) by mouth daily. 09/01/17   Tonny Bollman, MD  carvedilol (COREG) 12.5 MG tablet Take 1 tablet (12.5 mg total) by mouth 2 (two) times daily with a meal. 09/24/16   Tonny Bollman, MD  chlorpheniramine-HYDROcodone [facility]) 10-8 MG/5ML SUER Take 5 mLs by mouth every 12 (twelve) hours as needed for cough. 05/18/17   Elvina Sidle, MD  cyclobenzaprine (FLEXERIL) 10 MG tablet Take 10 mg by mouth 2 (two) times daily.    [provider]  diazepam (VALIUM) 5 MG tablet diazepam 5 mg tablet    [provider]  ferrous sulfate 325 (65 FE) MG EC tablet Take 325 mg by mouth daily.    [provider]  HYDROcodone-acetaminophen (NORCO) 5-325 MG tablet Take 1 tablet by mouth every 6 (six) hours as needed for moderate pain. 07/19/17   Ronnald Nian, MD  ibuprofen (ADVIL,MOTRIN) 800 MG tablet Take 800  mg by mouth every 8 (eight) hours as needed.    [provider]  levETIRAcetam (KEPPRA) 500 MG tablet Take 1 tablet (500 mg total) by mouth 2 (two) times daily. 07/19/17   Penumalli, Glenford Bayley, MD  losartan-hydrochlorothiazide (HYZAAR) 100-25 MG tablet Take 1 tablet by mouth daily. 10/13/16   Tonny Bollman, MD  Multiple Vitamins-Minerals (MULTIVITAMIN WITH MINERALS) tablet Take 1 tablet by mouth daily.    [provider]  naproxen (NAPROSYN) 500 MG tablet Take 500 mg by mouth 2 (two) times daily with a meal.    [provider]  potassium chloride SA (K-DUR,KLOR-CON) 20 MEQ tablet Take 1 tablet (20 mEq total) by mouth 2  (two) times daily. 09/28/16   Tonny Bollman, MD  predniSONE (DELTASONE) 20 MG tablet Two daily with food 05/18/17   Elvina Sidle, MD    Family History Family History  Problem Relation Age of Onset  . Sickle cell trait Sister   . Hypertension Brother   . Sickle cell anemia Mother   . Hypertension Mother   . Heart attack Mother 54  . Seizures Sister   . Sickle cell trait Sister   . Hypertension Brother   . Hypertension Brother   . Hypertension Son     Social History Social History   Tobacco Use  . Smoking status: Former Smoker    Packs/day: 0.75    Years: 37.00    Pack years: 27.75    Types: Cigarettes    Last attempt to quit: 10/12/2016    Years since quitting: 1.0  . Smokeless tobacco: Never Used  Substance Use Topics  . Alcohol use: Yes    Alcohol/week: 0.0 standard drinks    Comment: ocassionally on weekends   . Drug use: No     Allergies   Penicillins   Review of Systems Review of Systems  Constitutional: Negative for chills and fever.  HENT: Negative.   Gastrointestinal: Negative for abdominal pain, bowel incontinence, nausea and vomiting.  Genitourinary: Negative for bladder incontinence, dysuria, frequency and urgency.  Musculoskeletal: Positive for back pain.  Skin: Negative for wound.  Neurological: Negative for syncope and headaches.  Psychiatric/Behavioral: Negative for confusion.     Physical Exam Updated Vital Signs BP (!) 147/96 (BP Location: Left Arm)   Pulse 74   Temp 98.4 F (36.9 C) (Oral)   Resp 14   SpO2 99%   Physical Exam  Constitutional: She appears well-developed and well-nourished. No distress.  HENT:  Head: Normocephalic and atraumatic.  Eyes: EOM are normal.  Neck: Neck supple.  Cardiovascular: Normal rate and intact distal pulses.  Pulmonary/Chest: Effort normal.  Abdominal: Soft. There is no tenderness.  Musculoskeletal:       Thoracic back: She exhibits decreased range of motion (due to pain), tenderness and  spasm.       Back:  Radial pulses 2+.  Neurological: She is alert. She has normal strength. Gait normal.  Reflex Scores:      Bicep reflexes are 2+ on the right side and 2+ on the left side.      Brachioradialis reflexes are 2+ on the right side and 2+ on the left side.      Patellar reflexes are 2+ on the right side and 2+ on the left side. Grips are equal, no foot drag with ambulation.  Skin: Skin is warm and dry.  Psychiatric: She has a normal mood and affect.  Nursing note and vitals reviewed.    ED Treatments / Results  Labs (all labs ordered are listed, but only abnormal results are displayed) Labs Reviewed - No data to display Radiology No results found.  Procedures Procedures (including critical care time)  Medications Ordered in ED Medications  diazepam (VALIUM) tablet 2 mg (2 mg Oral Given 11/10/17 1506)  oxyCODONE-acetaminophen (PERCOCET/ROXICET) 5-325 MG per tablet 1 tablet (1 tablet Oral Given 11/10/17 1506)     Initial Impression / Assessment and Plan / ED Course  I have reviewed the triage vital signs and the nursing notes.  57 y.o. female with acute on chronic back pain.  No neurological deficits and normal neuro exam.  Patient can walk but states is painful.  No loss of bowel or bladder control.  No concern for cauda equina.  No fever, night sweats, weight loss, h/o cancer, IVDU.  RICE protocol and pain medicine indicated and discussed with patient. Patient's symptoms improved with treatment in the ED. Patient to resume scheduled medication regiment and f/u with her doctor.   Final Clinical Impressions(s) / ED Diagnoses   Final diagnoses:  Chronic right-sided thoracic back pain    ED Discharge Orders    None       Kerrie [place], [place] 11/10/17 1547    Raeford Razor, MD 11/11/17 1022

## 2017-12-09 ENCOUNTER — Ambulatory Visit
Admission: RE | Admit: 2017-12-09 | Discharge: 2017-12-09 | Disposition: A | Discharge status: Home / Self Care | Payer: Managed Care, Other (non HMO) | Source: Ambulatory Visit | Attending: Family Medicine | Admitting: Family Medicine

## 2017-12-09 DIAGNOSIS — Z1239 Encounter for other screening for malignant neoplasm of breast: Secondary | ICD-10-CM

## 2017-12-09 DIAGNOSIS — E2839 Other primary ovarian failure: Secondary | ICD-10-CM

## 2017-12-19 ENCOUNTER — Other Ambulatory Visit: Payer: Self-pay | Admitting: Cardiovascular Disease

## 2017-12-27 ENCOUNTER — Telehealth: Payer: Self-pay | Admitting: Cardiovascular Disease

## 2017-12-27 MED ORDER — HYDROCHLOROTHIAZIDE 25 MG PO TABS: 25.0000 mg | ORAL_TABLET | Freq: Every day | ORAL | 3 refills | Status: DC

## 2017-12-27 MED ORDER — LOSARTAN POTASSIUM 100 MG PO TABS: 100.0000 mg | ORAL_TABLET | Freq: Every day | ORAL | 3 refills | Status: DC

## 2017-12-27 NOTE — Telephone Encounter (Signed)
New message  Pt c/o medication issue:  1. Name of Medication: losartan-hydrochlorothiazide (HYZAAR) 100-25 MG tablet  2. How are you currently taking this medication (dosage and times per day)?1time daily  3. Are you having a reaction (difficulty breathing--STAT)? No   4. What is your medication issue? Patient states that this medication has been recalled. Patient would like another prescription written for a new medication. Please call to discuss.

## 2017-12-27 NOTE — Telephone Encounter (Signed)
[name] reports she was told by her pharmacy her Hyzaar was recalled and will need alternative medications called in.  Called and confirmed with patient's pharmacy that Losartan 100 mg has NOT been recalled. Sent rx for losartan 100 mg daily and HCTZ 25 mg daily.  Patient was grateful for call and agrees with treatment plan.

## 2017-12-28 ENCOUNTER — Other Ambulatory Visit: Payer: Self-pay | Admitting: Cardiovascular Disease

## 2018-02-07 ENCOUNTER — Other Ambulatory Visit: Payer: Self-pay | Admitting: Cardiovascular Disease

## 2018-02-23 ENCOUNTER — Other Ambulatory Visit: Payer: Self-pay

## 2018-03-24 ENCOUNTER — Ambulatory Visit: Payer: Managed Care, Other (non HMO) | Admitting: Family Medicine

## 2018-03-24 VITALS — BP 124/70 | HR 74 | Temp 98.2°F | Resp 16 | Wt 165.4 lb

## 2018-03-24 DIAGNOSIS — H1031 Unspecified acute conjunctivitis, right eye: Secondary | ICD-10-CM

## 2018-03-24 MED ORDER — ERYTHROMYCIN 5 MG/GM OP OINT: 1.0000 "application " | TOPICAL_OINTMENT | Freq: Four times a day (QID) | OPHTHALMIC | 0 refills | Status: DC

## 2018-03-24 NOTE — Patient Instructions (Signed)
Bacterial Conjunctivitis, Adult  Bacterial conjunctivitis is an infection of your conjunctiva. This is the clear membrane that covers the white part of your eye and the inner part of your eyelid. This infection can make your eye:  · Red or pink.  · Itchy.  This condition spreads easily from person to person (is contagious) and from one eye to the other eye.  What are the causes?  · This condition is caused by germs (bacteria). You may get the infection if you come into close contact with:  ? A person who has the infection.  ? Items that have germs on them (are contaminated), such as face towels, contact lens solution, or eye makeup.  What increases the risk?  You are more likely to get this condition if you:  · Have contact with people who have the infection.  · Wear contact lenses.  · Have a sinus infection.  · Have had a recent eye injury or surgery.  · Have a weak body defense system (immune system).  · Have dry eyes.  What are the signs or symptoms?    · Thick, yellowish discharge from the eye.  · Tearing or watery eyes.  · Itchy eyes.  · Burning feeling in your eyes.  · Eye redness.  · Swollen eyelids.  · Blurred vision.  How is this treated?    · Antibiotic eye drops or ointment.  · Antibiotic medicine taken by mouth. This is used for infections that do not get better with drops or ointment or that last more than 10 days.  · Cool, wet cloths placed on the eyes.  · Artificial tears used 2-6 times a day.  Follow these instructions at home:  Medicines  · Take or apply your antibiotic medicine as told by your doctor. Do not stop taking or applying the antibiotic even if you start to feel better.  · Take or apply over-the-counter and prescription medicines only as told by your doctor.  · Do not touch your eyelid with the eye-drop bottle or the ointment tube.  Managing discomfort  · Wipe any fluid from your eye with a warm, wet washcloth or a cotton ball.  · Place a clean, cool, wet cloth on your eye. Do this for  10-20 minutes, 3-4 times per day.  General instructions  · Do not wear contacts until the infection is gone. Wear glasses until your doctor says it is okay to wear contacts again.  · Do not wear eye makeup until the infection is gone. Throw away old eye makeup.  · Change or wash your pillowcase every day.  · Do not share towels or washcloths.  · Wash your hands often with soap and water. Use paper towels to dry your hands.  · Do not touch or rub your eyes.  · Do not drive or use heavy machinery if your vision is blurred.  Contact a doctor if:  · You have a fever.  · You do not get better after 10 days.  Get help right away if:  · You have a fever and your symptoms get worse all of a sudden.  · You have very bad pain when you move your eye.  · Your face:  ? Hurts.  ? Is red.  ? Is swollen.  · You have sudden loss of vision.  Summary  · Bacterial conjunctivitis is an infection of your conjunctiva.  · This infection spreads easily from person to person.  · Wash your hands often   with soap and water. Use paper towels to dry your hands.  · Take or apply your antibiotic medicine as told by your doctor.  · Contact a doctor if you have a fever or you do not get better after 10 days.  This information is not intended to replace advice given to you by your health care provider. Make sure you discuss any questions you have with your health care provider.  Document Released: 12/02/2007 Document Revised: 09/28/2017 Document Reviewed: 09/28/2017  Elsevier Interactive Patient Education © 2019 [employer]vier Inc[employer]

## 2018-03-24 NOTE — Progress Notes (Signed)
   Subjective:    Patient ID: [name], female    DOB: 13-Dec-1960, 58 y.o.   MRN: [id number]  HPI She started having difficulty with itching and slight erythema yesterday and then this morning it got much worse with some crusting.  No earache, sore throat, fever or chills.   Review of Systems     Objective:   Physical Exam Right conjunctival is injected however anterior chamber and cornea appear normal.  No preauricular nodes noted.  TMs clear.  Neck is supple without adenopathy.       Assessment & Plan:  Acute conjunctivitis of right eye, unspecified acute conjunctivitis type - Plan: erythromycin ophthalmic ointment She is to use this for roughly a week.

## 2018-04-24 ENCOUNTER — Encounter: Payer: Self-pay | Admitting: Family Medicine

## 2018-04-24 ENCOUNTER — Ambulatory Visit: Payer: Managed Care, Other (non HMO) | Admitting: Family Medicine

## 2018-04-24 VITALS — BP 120/80 | HR 76 | Temp 98.3°F | Wt 160.2 lb

## 2018-04-24 DIAGNOSIS — M545 Low back pain, unspecified: Secondary | ICD-10-CM

## 2018-04-24 LAB — POCT URINALYSIS DIP (PROADVANTAGE DEVICE)
Bilirubin, UA: NEGATIVE
Blood, UA: NEGATIVE
Glucose, UA: NEGATIVE mg/dL
Ketones, POC UA: NEGATIVE mg/dL
Leukocytes, UA: NEGATIVE
Nitrite, UA: NEGATIVE
PROTEIN UA: NEGATIVE mg/dL
SPECIFIC GRAVITY, URINE: 1.025
UUROB: NEGATIVE
pH, UA: 5.5 (ref 5.0–8.0)

## 2018-04-24 NOTE — Patient Instructions (Addendum)
Use heat or ice to your low back 2-3 times per day. Take 2 Aleve twice daily with food. You can also try a topical pain medication such as Salon Pas or Biofreeze.   If you are getting worse or not improving then you may want to contact your spine specialist.

## 2018-04-24 NOTE — Progress Notes (Signed)
Chief Complaint  Patient presents with  . back pain    right side back pain with movement or sitting, started last week. no urinary symptoms, frequency urinary,     Subjective:  [name] is a 58 y.o. female who complains of right low back pain and possible urinary tract infection.  She has had symptoms for 1 week.  Symptoms include right low back pain and urinary frequency that resolved a couple of days ago. Patient denies fever, vaginal discharge and chills, chest pain, abdominal pain, N/V/D. Marland Kitchen  Using nothing for current symptoms.    Right low back pain is dull, non radiating. Worse with certain movements. She thought this was due to UTI.  Pain is relieved with rest.   States she has not been taking any medications for her chronic back pain  States she has seen a spine specialist but not recently.   Patient does not have a history of recurrent UTI. Patient does not have a history of pyelonephritis.  No other aggravating or relieving factors.  No other c/o.  Past Medical History:  Diagnosis Date  . Atrial fibrillation (HCC)   . Back pain   . History of echocardiogram    Echo 5/17 - mild concentric LVH, vigorous LVEF, EF 65-70%, no RWMA, Gr 1 DD, normal RVEF, normal bilat atrial size  . Hypertension   . Seizures (HCC)    around 2014    ROS as in subjective  Reviewed allergies, medications, past medical, surgical, and social history.    Objective: Vitals:   04/24/18 1533  BP: 120/80  Pulse: 76  Temp: 98.3 F (36.8 C)    General appearance: alert, no distress, WD/WN, female Abdomen: +bs, soft, non tender, non distended, no masses, no hepatomegaly, no splenomegaly, no bruits Back: no CVA tenderness. Right lumbar paraspinal TTP, no spine tenderness. Normal sensation and motion of back.  GU: declines      Laboratory:  Urine dipstick: negative for all components.       Assessment: Right-sided low back pain without sciatica, unspecified chronicity - Plan: POCT  Urinalysis DIP (Proadvantage Device)    Plan: Discussed that her symptoms appear to be related to a MSK etiology and not a UTI. Her urinary symptoms resolved. She has a complex history of chronic back pain and has been under the care of a pain management/spine specialist. Has not had to see them in several months.  Discussed that she has no neurological deficits and this does not appear to be neuropathic. Recommend that she treat her symptoms conservatively for the next week or two and if not improving then contact her spine specialist. She may try heat or ice, topical analgesic and 2 Aleve twice daily.    Urine culture was not sent.

## 2018-04-26 ENCOUNTER — Other Ambulatory Visit: Payer: Self-pay | Admitting: Diagnostic Neuroimaging

## 2018-04-26 NOTE — Telephone Encounter (Signed)
Keppra refilled x 1 month. Note to pharmacy: patient must keep FU in March.

## 2018-05-02 ENCOUNTER — Other Ambulatory Visit: Payer: Self-pay | Admitting: *Deleted

## 2018-05-02 ENCOUNTER — Telehealth: Payer: Self-pay | Admitting: Nurse Practitioner

## 2018-05-02 MED ORDER — LEVETIRACETAM 500 MG PO TABS: 500.0000 mg | ORAL_TABLET | Freq: Two times a day (BID) | ORAL | 0 refills | Status: DC

## 2018-05-02 NOTE — Telephone Encounter (Signed)
Pt is calling re: her levETIRAcetam (KEPPRA) 500 MG tablet.  Pt states Dr Marjory Lies wrote her a 30 day script but her insurance will only cover a 90 script, because the script is only for 30 days the pharmacy will not release the prescription to her.  Pt is asking if Dr Marjory Lies will send a revised script to the pharmacy [facility] [phone]

## 2018-05-02 NOTE — Telephone Encounter (Signed)
Renewed for 90 day.  Keep March appt 2020 already scheduled.

## 2018-05-08 ENCOUNTER — Ambulatory Visit: Payer: Managed Care, Other (non HMO) | Admitting: Nurse Practitioner

## 2018-05-22 ENCOUNTER — Telehealth: Payer: Self-pay | Admitting: *Deleted

## 2018-05-22 ENCOUNTER — Ambulatory Visit: Payer: Managed Care, Other (non HMO) | Admitting: Diagnostic Neuroimaging

## 2018-05-22 NOTE — Telephone Encounter (Signed)
Spoke with  patient who had gotten VM re: reschedule today's FU due to corona virus. Rescheduled her with A Lomax, NP and advised her medication will be refilled as needed until her FU, advised she call before for any questions or concerns. She verbalized understanding, appreciation.

## 2018-05-29 ENCOUNTER — Telehealth: Payer: Self-pay | Admitting: *Deleted

## 2018-05-29 NOTE — Telephone Encounter (Signed)
   [facility] Pre-operative Risk Assessment    Request for surgical clearance:  1. What type of surgery is being performed? COLONOSCOPY   2. When is this surgery scheduled? 07/19/18   3. What type of clearance is required (medical clearance vs. Pharmacy clearance to hold med vs. Both)? MEDICAL  4. Are there any medications that need to be held prior to surgery and how long?ASA   5. Practice name and name of physician performing surgery? [facility] DR. MANN   6. What is your office phone number [phone]    7.   What is your office fax number [phone]  8.   Anesthesia type (None, local, MAC, general) ? PROPOFOL    Julaine Hua 05/29/2018, 4:42 PM  _________________________________________________________________   (provider comments below)

## 2018-05-30 NOTE — Telephone Encounter (Signed)
   Primary Cardiologist: Tonny Bollman, MD  Chart reviewed as part of pre-operative protocol coverage. Patient was contacted 05/30/2018 in reference to pre-operative risk assessment for pending surgery as outlined below.  Addeline Kastle was last seen on 09/01/2017 by Dr. Excell Seltzer.  Since that day, Debbora Wildt has done well. She has no known CAD history or CHF. She has remote hx of afib with no further episodes in several years, not on anticoagulation. Currently she is having no palpitations, chest pain or shortness of breath.   Therefore, based on ACC/AHA guidelines, the patient would be at acceptable risk for the planned procedure without further cardiovascular testing.   Aspirin can be held for the procedure as needed.   With the current COVID 19 pandemic our health system is recommending that elective procedures be postponed. This was discussed with the patient and she is inclined to wait until a later time for the procedure.   I will route this recommendation to the requesting party via Epic fax function and remove from pre-op pool.  Please call with questions.  Berton Bon, NP 05/30/2018, 9:12 AM

## 2018-06-13 ENCOUNTER — Encounter: Payer: Self-pay | Admitting: *Deleted

## 2018-06-13 ENCOUNTER — Telehealth: Payer: Self-pay | Admitting: *Deleted

## 2018-06-13 NOTE — Telephone Encounter (Signed)
Spoke to pt. She consented to VIDEO VISIT with Shawnie Dapper, NP.   Due to current COVID 19 pandemic, our office is severely reducing in office visits for at least the next 2 weeks, in order to minimize the risk to our patients and healthcare providers.  Pt understands that although there may be some limitations with this type of visit, we will take all precautions to reduce any security or privacy concerns.  Pt understands that this will be treated like an in office visit and we will file with pt's insurance.  Pt's email is [email or web address]. Pt understands that the [employer] webex software must be downloaded and operational on the device pt plans to use for the visit.  She needed to change the appt due to her work schedule from 06-14-18 to 06-15-18 at 1330.  Medicatons, allergies, history, pharmacy updated.

## 2018-06-13 NOTE — Telephone Encounter (Signed)
Spoke to pt .   She is working from home.  She did not have her appt with Amy NP on her calendar.  So she is calling her work to check on this.  I relayed that due to current COVID 19 pandemic, our office is severely reducing in office visits for at least the next 2 weeks, in order to minimize the risk to our patients and healthcare providers and was converting appt to video visit.  She will call me back after checking with her work.

## 2018-06-13 NOTE — Telephone Encounter (Signed)
Pt has returned the call to RN Andrey Campanile re: a virtual visit, she is asking for a call back

## 2018-06-14 ENCOUNTER — Ambulatory Visit: Payer: Self-pay | Admitting: Family Medicine

## 2018-06-15 ENCOUNTER — Ambulatory Visit (INDEPENDENT_AMBULATORY_CARE_PROVIDER_SITE_OTHER): Payer: Managed Care, Other (non HMO) | Admitting: Family Medicine

## 2018-06-15 ENCOUNTER — Encounter: Payer: Self-pay | Admitting: Family Medicine

## 2018-06-15 ENCOUNTER — Other Ambulatory Visit: Payer: Self-pay

## 2018-06-15 DIAGNOSIS — G40909 Epilepsy, unspecified, not intractable, without status epilepticus: Secondary | ICD-10-CM

## 2018-06-15 NOTE — Progress Notes (Signed)
PATIENT: [name] DOB: Sep 06, 1960  REASON FOR VISIT: follow up HISTORY FROM: patient  Virtual Visit via Telephone Note  I connected with [name] on 06/15/18 at  1:30 PM EDT by telephone and verified that I am speaking with the correct person using two identifiers.   I discussed the limitations, risks, security and privacy concerns of performing an evaluation and management service by telephone and the availability of in person appointments. I also discussed with the patient that there may be a patient responsible charge related to this service. The patient expressed understanding and agreed to proceed.   History of Present Illness:  06/15/18 [name] is a 58 y.o. female for follow up of seizure.  She reports that she is doing very well on levetiracetam 500 mg twice daily.  She does admit that she has taken the second dose of medication later than she should have.  She denies missed doses.  She denies any seizure activity. She denies any concerns at this time.    HISTORY (copied from [employer] note on 05/04/2017)   [name], [name] female returns for follow-up with history of complex partial seizure disorder.  Last seizure activity September 2013.  She is currently well controlled on Levetiracetam  500 mg twice daily.  She denies side effects to the medication.  No other medical issues since last seen.  She returns for reevaluation  UPDATE 11/11/15: Since last visit, doing well. No sz. Patient may be moving to [place]. Otherwise doing well.  UPDATE 05/22/14: Since last visit, doing well. No seizures. Some intermittent eyelid and lip twitching. Int toe spasms. Overall doing well.   UPDATE 05/22/13: Doing well. No seizures since 11/11/11. Now on better schedule of LEV 500mg  BID, without missing doses. 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 This is challenging and has caused her increased stress.  Also, her younger brother also diagnosed with seizure disorder now.  UPDATE 11/15/11: Felt on Thurs (11/11/11) had feelings of having seizure; she felt "out of body". She was in the car and she had someone to come and pick her up. She was aware of the situation. She has missed an average of 3 hs doses/wk of LEV 500mg . She has had increased stress with her mother's declining health. Having difficulty with putting thoughts together and repeating words. She is also having increased anxiety.   UPDATE 01/05/11: Doing well. Tolerating meds. Back to driving. No seizure since Jun 24, 2009.  UPDATE 11/04/09: Doing well; no further seizures. Having intermittent twitching in toes up to thighs; sometimes bilateral, sometimes unilateral; no triggers. Still with short temper.  PRIOR HPI (07/22/09): 59 year old right-handed female with hypertension and atrial fibrillation presenting for evaluation of possible seizure disorder. The patient has had 3 episodes in her life of altered or loss of consciousness. The first episode occurred in the 1990s when she was a mall and a "strange feeling" with pain over her. She felt as though she was about to pass out. She was able to sit down and did not lose consciousness. She was somewhat confused. Her brother came to pick her up and she did not seek medical attention. The second episode occurred in 2004 when she woke up in the middle of the night feeling very hot. She went to the kitchen and the next thing she remembers is on the floor with her daughter standing over her. She was evaluated in the hospital MRI of the brain revealed  a right parietal periventricular focus of gliosis (which I reviewed and agree with report). Followup MRIs demonstrated stability of this lesion. June 24, 2009 the patient was at the beauty salon, when she all of a sudden she began to profusely sweating. She felt as though she was about to go into the base. In PT she gave her a walker she was  able to drink and she ended up going into a "stare" and that her eyes rolled back and she began to shake all over. Unclear what the duration of this event was. She did not get her tongue. He did not have urinary or bowel incontinence. Paramedics were called to the scene and reportedly she had a "weak pulse" (no documentation of this). She was [facility] emergency room with CT scan and MRI of the brain, which showed stability of the right parietal gliosis (I reviewed images and agree). I discussed case with the emergency room clinicians in the evening patient was started on levetiracetam 500 mg BID. Since discharge she is had no further events. She does have some fatigue and mood swings with this medication. In retrospect patient has been having episodes of dj vu and out of body experience. She denies olfactory hallucinations. She has a family history of seizure (sister). She denies any prior head trauma. Denies meningitis or encephalitis. She did well in school (honor roll). She is not driving presently.   Observations/Objective:  Generalized: Well developed, in no acute distress  Mentation: Alert oriented to time, place, history taking. Follows all commands speech and language fluent Motor: Moves all extremities fluently.   Assessment and Plan:  58 y.o. year old female  has a past medical history of Atrial fibrillation (HCC), Back pain, History of echocardiogram, Hypertension, and Seizures (HCC). here with    ICD-10-CM   1. Seizure disorder (HCC) G40.909    [name] is doing well with levetiracetam 500mg  BID. She is tolerating medication with no obvious adverse effects. She denies seizure activity. We will continue current therapy. She was advised to call immediately for any new seizure activity or other concerns. She verbalizes understanding and agreement with this plan.  She will follow-up with me in 1 year.  No orders of the defined types were placed in this encounter.   No  orders of the defined types were placed in this encounter.    Follow Up Instructions:  I discussed the assessment and treatment plan with the patient. The patient was provided an opportunity to ask questions and all were answered. The patient agreed with the plan and demonstrated an understanding of the instructions.   The patient was advised to call back or seek an in-person evaluation if the symptoms worsen or if the condition fails to improve as anticipated.  I provided 25 minutes of non-face-to-face time during this encounter.  Patient is located at her place of residence during video visit.  Provider is at her place of residence.  Alverda SkeansSandy Young, RN help to facilitate visit.   Shawnie DapperAmy Dyana Magner, NP

## 2018-06-26 NOTE — Progress Notes (Signed)
I reviewed note and agree with plan.   VIKRAM R. PENUMALLI, MD 06/26/2018, 9:13 AM Certified in Neurology, Neurophysiology and Neuroimaging  [facility] [address], [address] [place], [place] [address] [phone]  

## 2018-07-03 ENCOUNTER — Telehealth: Payer: Self-pay | Admitting: Student

## 2018-07-03 NOTE — Telephone Encounter (Signed)
She has well-controlled high blood pressure and history of afib many years ago. No hx of CAD or heart failure. Her risk would be modestly increased considering her age and underlying medical issues. If she is still smoking that increases her risk as well.

## 2018-07-03 NOTE — Telephone Encounter (Signed)
New message   Patient wants to know if she  is at high risk due to her heart condition because of Covid 19. The patient states that she may have to go back to work early. Please call to discuss.

## 2018-07-03 NOTE — Telephone Encounter (Signed)
Reviewed Dr.Cooper's comments with the patient. Reviewed proper hand hygiene, social distancing and encouraged her to wear a mask if she returns to work. She was grateful for call and agrees with treatment plan.

## 2018-08-04 LAB — HM COLONOSCOPY

## 2018-08-07 ENCOUNTER — Other Ambulatory Visit: Payer: Self-pay | Admitting: *Deleted

## 2018-08-07 MED ORDER — LEVETIRACETAM 500 MG PO TABS: 500.0000 mg | ORAL_TABLET | Freq: Two times a day (BID) | ORAL | 3 refills | Status: DC

## 2018-08-09 ENCOUNTER — Encounter: Payer: Self-pay | Admitting: Family Medicine

## 2018-08-10 ENCOUNTER — Encounter: Payer: Self-pay | Admitting: Family Medicine

## 2018-08-23 ENCOUNTER — Other Ambulatory Visit: Payer: Self-pay | Admitting: Cardiovascular Disease

## 2018-09-06 ENCOUNTER — Other Ambulatory Visit: Payer: Self-pay | Admitting: Cardiovascular Disease

## 2018-09-07 ENCOUNTER — Other Ambulatory Visit: Payer: Self-pay

## 2018-09-07 MED ORDER — LOSARTAN POTASSIUM 100 MG PO TABS: 100.0000 mg | ORAL_TABLET | Freq: Every day | ORAL | 0 refills | Status: DC

## 2018-11-27 ENCOUNTER — Other Ambulatory Visit: Payer: Self-pay | Admitting: Cardiovascular Disease

## 2018-11-27 MED ORDER — AMLODIPINE BESYLATE 5 MG PO TABS: 5.0000 mg | ORAL_TABLET | Freq: Every day | ORAL | 0 refills | Status: DC

## 2018-11-27 MED ORDER — CARVEDILOL 12.5 MG PO TABS: 12.5000 mg | ORAL_TABLET | Freq: Two times a day (BID) | ORAL | 0 refills | Status: DC

## 2018-12-04 ENCOUNTER — Other Ambulatory Visit: Payer: Self-pay | Admitting: Cardiovascular Disease

## 2018-12-06 ENCOUNTER — Other Ambulatory Visit: Payer: Self-pay | Admitting: Cardiovascular Disease

## 2018-12-07 ENCOUNTER — Other Ambulatory Visit: Payer: Self-pay | Admitting: Cardiovascular Disease

## 2018-12-07 MED ORDER — LOSARTAN POTASSIUM 100 MG PO TABS: 100.0000 mg | ORAL_TABLET | Freq: Every day | ORAL | 0 refills | Status: DC

## 2018-12-07 MED ORDER — CARVEDILOL 12.5 MG PO TABS: 12.5000 mg | ORAL_TABLET | Freq: Two times a day (BID) | ORAL | 0 refills | Status: DC

## 2018-12-07 MED ORDER — AMLODIPINE BESYLATE 5 MG PO TABS: 5.0000 mg | ORAL_TABLET | Freq: Every day | ORAL | 0 refills | Status: DC

## 2018-12-07 NOTE — Telephone Encounter (Signed)
° ° °*  STAT* If patient is at the pharmacy, call can be transferred to refill team.   1. Which medications need to be refilled? (please list name of each medication and dose if known)  carvedilol (COREG) 12.5 MG tablet amLODipine (NORVASC) 5 MG tablet losartan (COZAAR) 100 MG tablet  2. Which pharmacy/location (including street and city if local pharmacy) is medication to be sent to?  [id number] - [place], [place] [address]  3. Do they need a 30 day or 90 day supply? 30 with refills  Patient has an appointment scheduled for 10/27

## 2018-12-07 NOTE — Telephone Encounter (Signed)
Pt's medications were sent to pt's pharmacy as requested. Confirmation received.  

## 2018-12-09 ENCOUNTER — Other Ambulatory Visit: Payer: Self-pay | Admitting: Cardiovascular Disease

## 2018-12-09 IMAGING — CR DG CHEST 2V
2 series · 2 of 2 positions shown · non-contrast
Comparison: One-view chest x-ray 05/07/2007

CLINICAL DATA: Headache and blurred vision.  Dizziness.

EXAM:
CHEST  2 VIEW

[w chest pa]
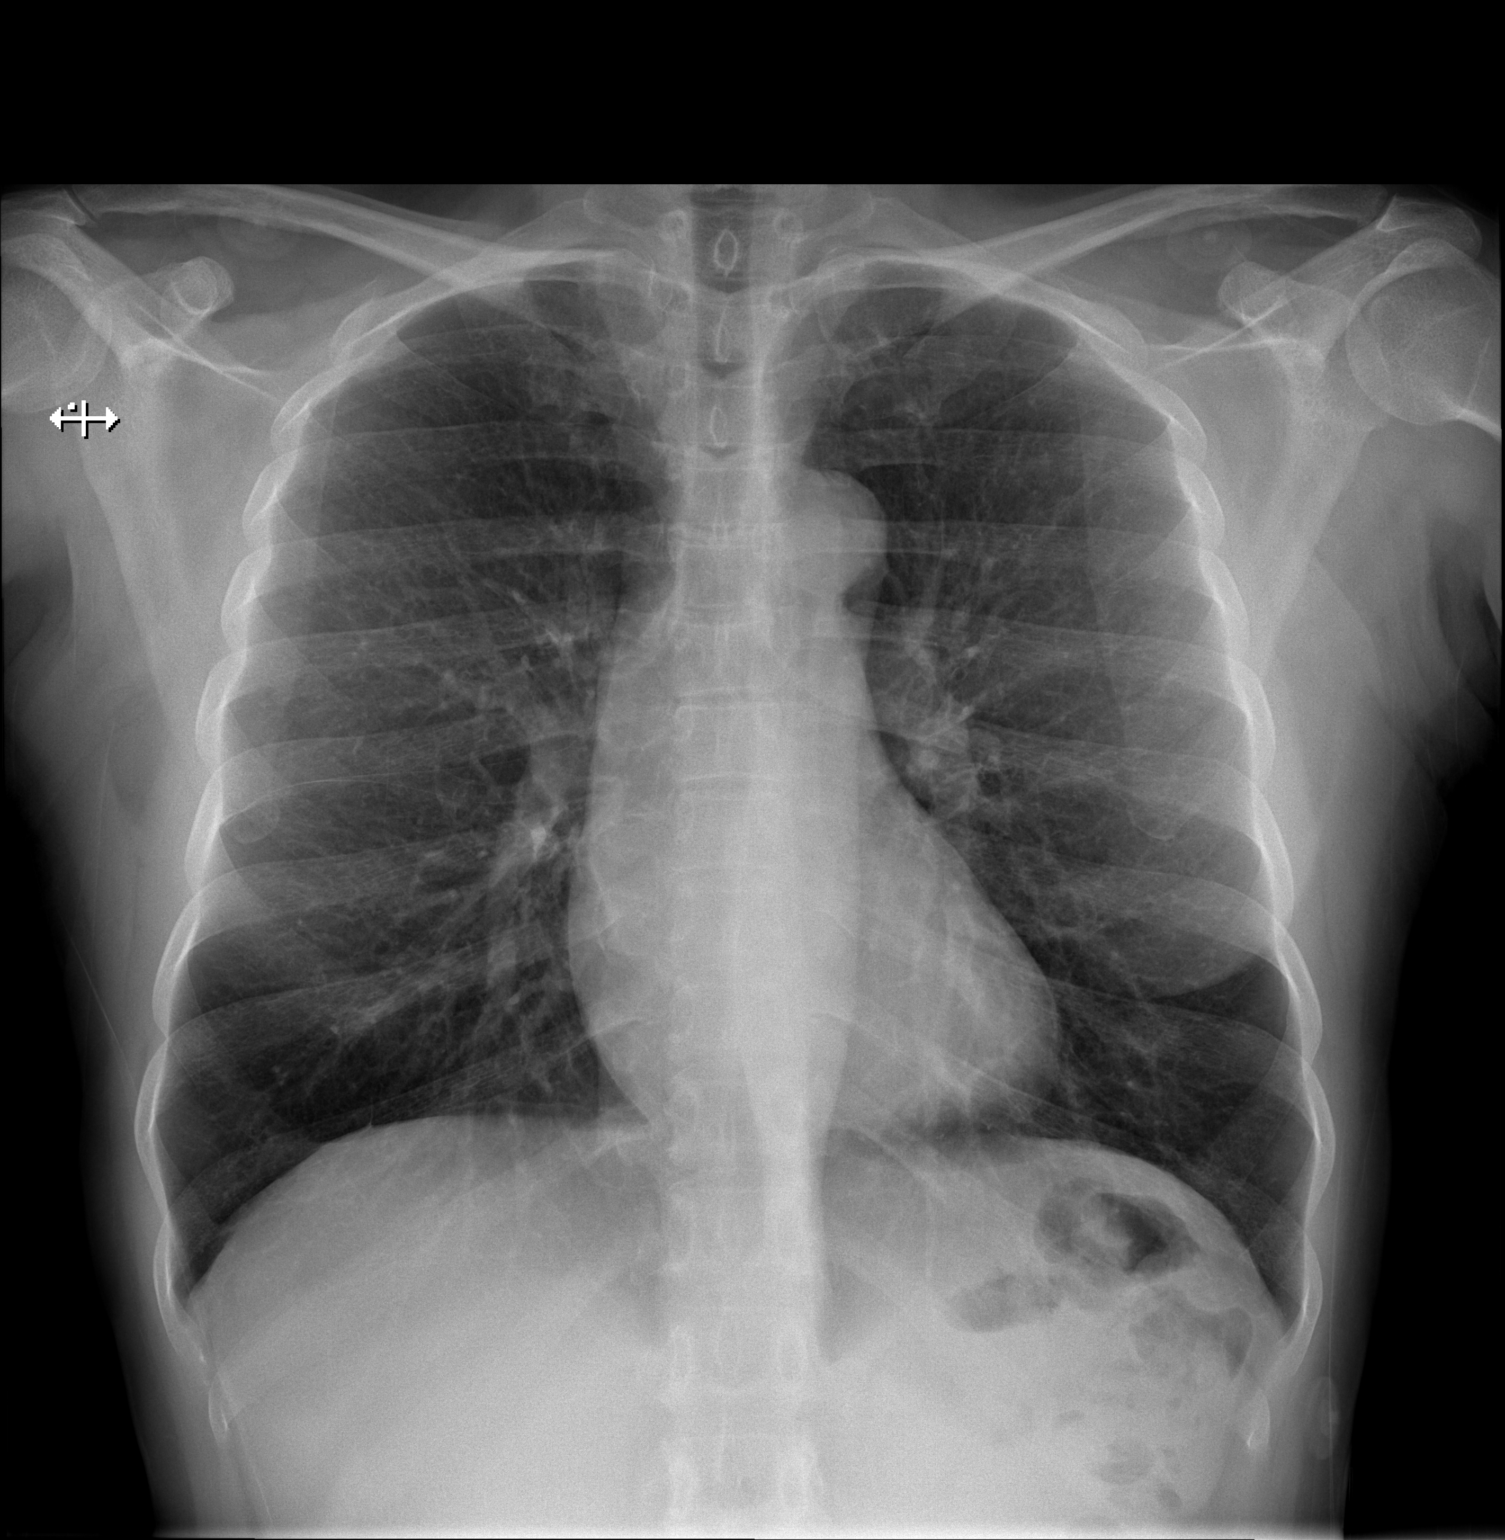

[w chest lat]
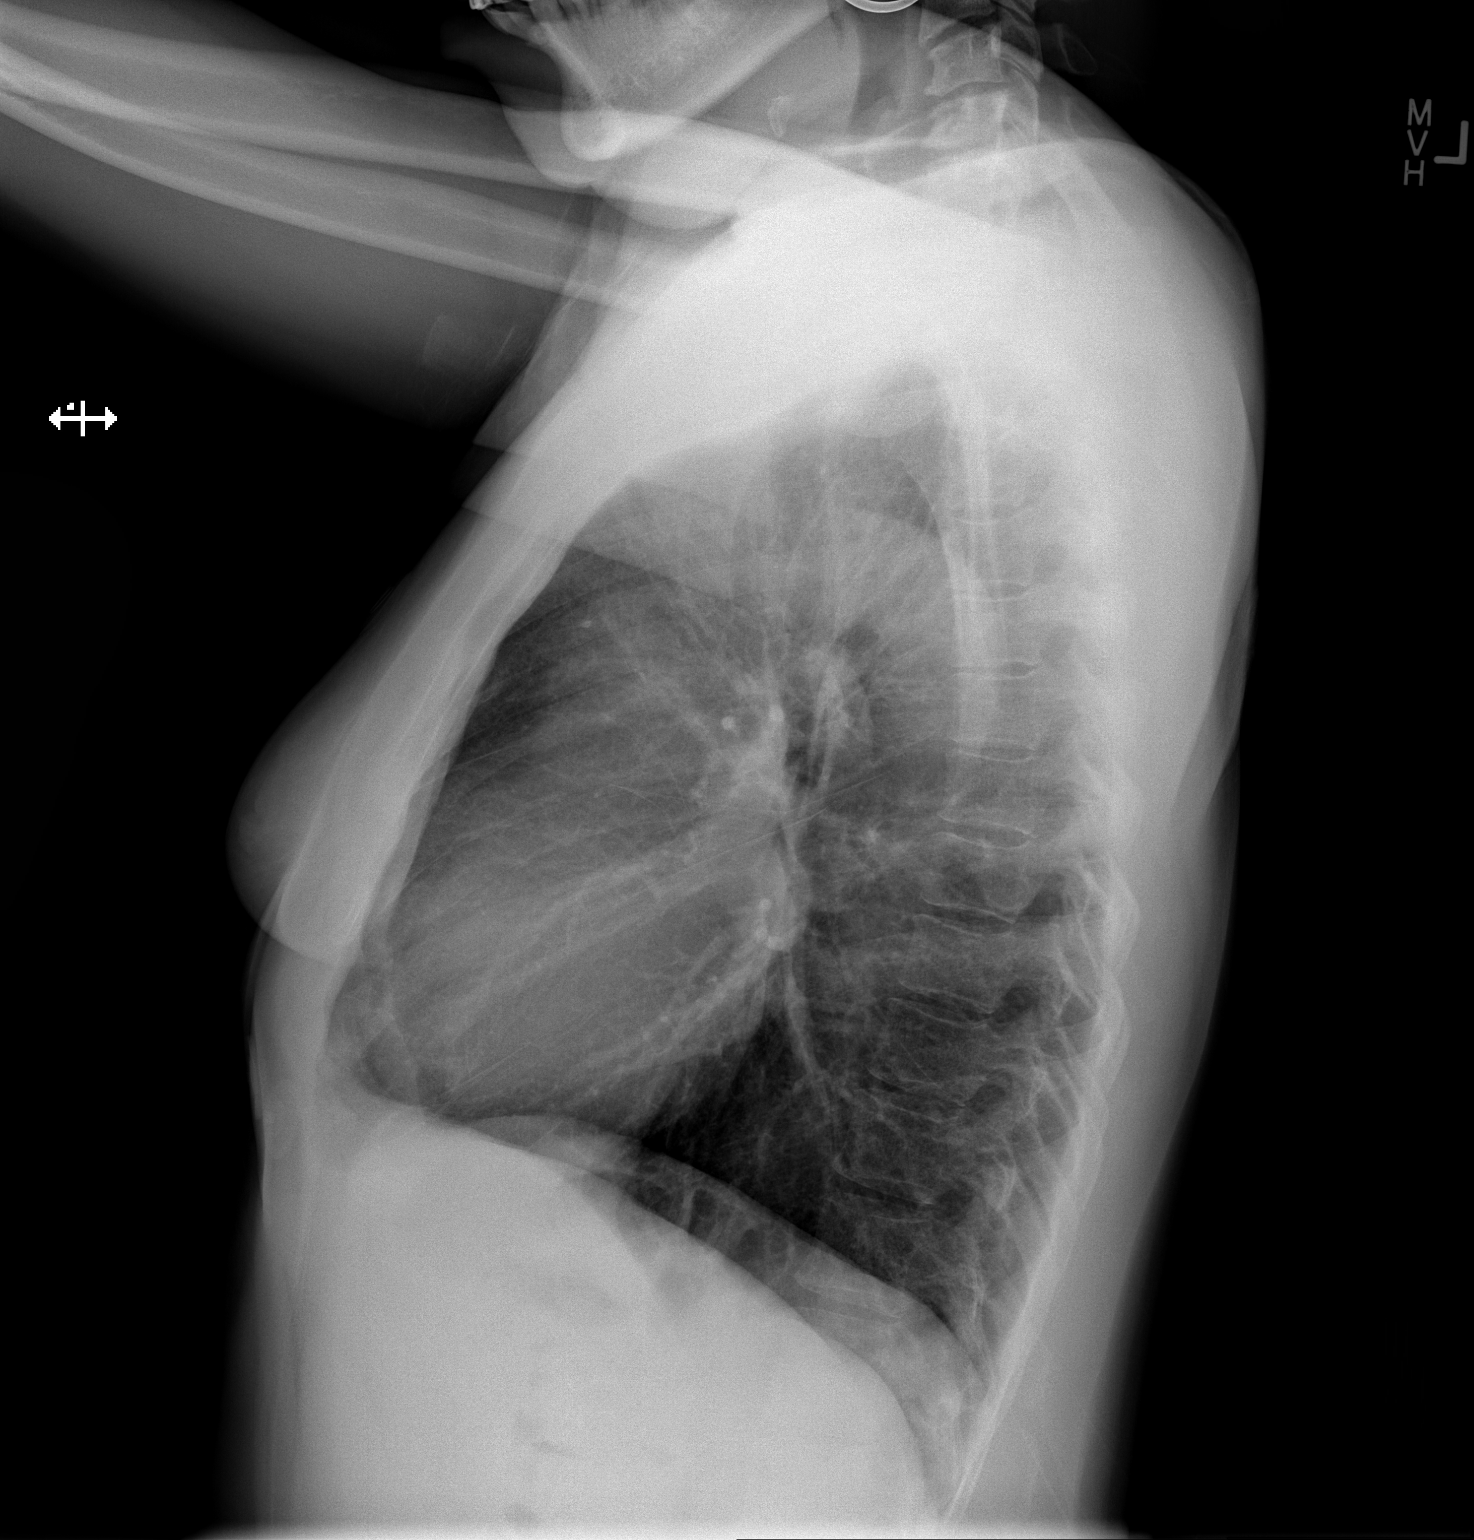

[2 of 2 positions shown; findings below may reference images not displayed]

FINDINGS: The heart size and mediastinal contours are within normal limits.
Both lungs are clear. The visualized skeletal structures are
unremarkable.
IMPRESSION: Negative two view chest x-ray

## 2018-12-11 ENCOUNTER — Other Ambulatory Visit: Payer: Self-pay | Admitting: Cardiovascular Disease

## 2018-12-11 MED ORDER — CARVEDILOL 12.5 MG PO TABS: 12.5000 mg | ORAL_TABLET | Freq: Two times a day (BID) | ORAL | 0 refills | Status: DC

## 2018-12-11 MED ORDER — LOSARTAN POTASSIUM 100 MG PO TABS: 100.0000 mg | ORAL_TABLET | Freq: Every day | ORAL | 0 refills | Status: DC

## 2018-12-27 ENCOUNTER — Encounter: Payer: Self-pay | Admitting: Physician Assistant

## 2018-12-27 ENCOUNTER — Ambulatory Visit: Payer: Managed Care, Other (non HMO) | Admitting: Physician Assistant

## 2018-12-27 ENCOUNTER — Other Ambulatory Visit: Payer: Self-pay

## 2018-12-27 VITALS — BP 116/78 | HR 78 | Ht 64.0 in | Wt 163.0 lb

## 2018-12-27 DIAGNOSIS — R002 Palpitations: Secondary | ICD-10-CM | POA: Diagnosis not present

## 2018-12-27 DIAGNOSIS — R0602 Shortness of breath: Secondary | ICD-10-CM | POA: Diagnosis not present

## 2018-12-27 DIAGNOSIS — I480 Paroxysmal atrial fibrillation: Secondary | ICD-10-CM

## 2018-12-27 NOTE — Progress Notes (Signed)
Cardiology Office Note    Date:  12/27/2018   ID:  [name], DOB 1960-10-28, MRN [id number]  PCP:  Avanell ShackletonHenson, Vickie L, NP-C  Cardiologist:  Dr. Excell Seltzerooper  Chief Complaint: 15 Months follow up  History of Present Illness:   [name] is a 58 y.o. female with hx of HTN and afib seen for follow up.    The patient has a remote history of atrial fibrillation with no evidence of this over recent years.  She has never been anticoagulated.    Last seen by Dr. Excell Seltzerooper 08/2017. Had palpitation. EKG with sinus rhythm. Benign holder monitor. Echo showed normal LVEF and grade 2 DD.   Here today for follow-up.  She reports 2 weeks ago she had intermittent fluttering sensation for 2 days.  No reoccurrence since then.  She thinks that she might be in atrial fibrillation.  She did not checked her pulse.  She is complaining of right-sided back pain without hematuria.  No burning with urination.  She thinks he might have UTI..  Follow-up with PCP.Marland Kitchen. Works from home and under a lot of stress.  Drinks 3 cups of caffeinated coffee each day.   Past Medical History:  Diagnosis Date  . Atrial fibrillation (HCC)   . Back pain   . History of echocardiogram    Echo 5/17 - mild concentric LVH, vigorous LVEF, EF 65-70%, no RWMA, Gr 1 DD, normal RVEF, normal bilat atrial size  . Hypertension   . Seizures (HCC)    around 2014    Past Surgical History:  Procedure Laterality Date  . CESAREAN SECTION      Current Medications: Prior to Admission medications   Medication Sig Start Date End Date Taking? Authorizing Provider  ALPRAZolam Prudy Feeler(XANAX) 0.5 MG tablet Take 0.5 mg by mouth 2 (two) times daily as needed (muscle relaxer).     [provider]  amLODipine (NORVASC) 5 MG tablet Take 1 tablet (5 mg total) by mouth daily. Please keep upcoming appt in October for future refills. Thank you 12/07/18   Tonny Bollmanooper, Michael, MD  aspirin EC 81 MG tablet Take 1 tablet (81 mg total) by mouth daily. Patient not  taking: Reported on 06/13/2018 09/01/17   Tonny Bollmanooper, Michael, MD  carvedilol (COREG) 12.5 MG tablet Take 1 tablet (12.5 mg total) by mouth 2 (two) times daily with a meal. Please keep upcoming appt in October for future refills. Thank you 12/11/18   Tonny Bollmanooper, Michael, MD  ferrous sulfate 325 (65 FE) MG EC tablet Take 325 mg by mouth daily.    [provider]  hydrochlorothiazide (HYDRODIURIL) 25 MG tablet Take 1 tablet (25 mg total) by mouth daily. 12/27/17 12/22/18  Tonny Bollmanooper, Michael, MD  ibuprofen (ADVIL,MOTRIN) 800 MG tablet Take 800 mg by mouth every 8 (eight) hours as needed.    [provider]  levETIRAcetam (KEPPRA) 500 MG tablet Take 1 tablet (500 mg total) by mouth 2 (two) times daily. 08/07/18   Lomax, Amy, NP  losartan (COZAAR) 100 MG tablet Take 1 tablet (100 mg total) by mouth daily. Please keep upcoming appt in October for future refills. Thank you 12/11/18   Tonny Bollmanooper, Michael, MD  Multiple Vitamins-Minerals (MULTIVITAMIN WITH MINERALS) tablet Take 1 tablet by mouth daily.    [provider]  potassium chloride SA (K-DUR,KLOR-CON) 20 MEQ tablet TAKE 1 TABLET BY MOUTH TWICE DAILY 12/28/17   Tonny Bollmanooper, Michael, MD    Allergies:   Penicillins   Social History   Socioeconomic History  .  Marital status: Single    Spouse name: Minerva Areola  . Number of children: 2  . Years of education: College  . Highest education level: Not on file  Occupational History  . Occupation: health care billing specialist    Employer: OTHER    Comment: [employer]  Social Needs  . Financial resource strain: Not on file  . Food insecurity    Worry: Not on file    Inability: Not on file  . Transportation needs    Medical: Not on file    Non-medical: Not on file  Tobacco Use  . Smoking status: Former Smoker    Packs/day: 0.75    Years: 37.00    Pack years: 27.75    Types: Cigarettes    Quit date: 10/12/2016    Years since quitting: 2.2  . Smokeless tobacco: Never Used  Substance and Sexual  Activity  . Alcohol use: Yes    Alcohol/week: 0.0 standard drinks    Comment: ocassionally on weekends   . Drug use: No  . Sexual activity: Yes    Partners: Male    Birth control/protection: Post-menopausal, Surgical    Comment: one partner since 2003  Lifestyle  . Physical activity    Days per week: Not on file    Minutes per session: Not on file  . Stress: Not on file  Relationships  . Social Musician on phone: Not on file    Gets together: Not on file    Attends religious service: Not on file    Active member of club or organization: Not on file    Attends meetings of clubs or organizations: Not on file    Relationship status: Not on file  Other Topics Concern  . Not on file  Social History Narrative   Patient lives with her daughter.   Her son lives in [place], independently.   Caffeine Use: 2-3 cups daily Coffee     Family History:  The patient's family history includes Heart attack (age of onset: 42) in her mother; Hypertension in her brother, brother, brother, mother, and son; Seizures in her sister; Sickle cell anemia in her mother; Sickle cell trait in her sister and sister.   ROS:   Please see the history of present illness.    ROS All other systems reviewed and are negative.   PHYSICAL EXAM:   VS:  BP 116/78   Pulse 78   Ht 5\' 4"  (1.626 m)   Wt 163 lb (73.9 kg)   SpO2 99%   BMI 27.98 kg/m    GEN: Well nourished, well developed, in no acute distress  HEENT: normal  Neck: no JVD, carotid bruits, or masses Cardiac: RRR; no murmurs, rubs, or gallops,no edema  Respiratory:  clear to auscultation bilaterally, normal work of breathing GI: soft, nontender, nondistended, + BS MS: no deformity or atrophy  Skin: warm and dry, no rash Neuro:  Alert and Oriented x 3, Strength and sensation are intact Psych: euthymic mood, full affect  Wt Readings from Last 3 Encounters:  12/27/18 163 lb (73.9 kg)  04/24/18 160 lb 3.2 oz (72.7 kg)  03/24/18 165  lb 6.4 oz (75 kg)      Studies/Labs Reviewed:   EKG:  EKG is ordered today.  The ekg ordered today demonstrates NSR at rate of 78 bpm  Recent Labs: No results found for requested labs within last 8760 hours.   Lipid Panel No results found for: CHOL, TRIG, HDL, CHOLHDL, VLDL, LDLCALC,  LDLDIRECT  Additional studies/ records that were reviewed today include:   Echocardiogram: 09/06/2017 - Left ventricle: The cavity size was normal. Wall thickness was   normal. Systolic function was normal. The estimated ejection   fraction was in the range of 60% to 65%. Wall motion was normal;   there were no regional wall motion abnormalities. Features are   consistent with a pseudonormal left ventricular filling pattern,   with concomitant abnormal relaxation and increased filling   pressure (grade 2 diastolic dysfunction).  48 holter monitor  The patient was monitored for 48 hours.  The predominant rhythm was sinus with an average rate of 81 bpm (range 57-119 bpm). The longest R-R interval was 1.1 seconds.  Rare PAC's and PVC's were observed. A single atrial run lasting 3 beats was noted.  There was no sustained arrhythmia or prolonged pause.   Predominantly sinus rhythm with rare PAC's and PVC's, as well as a single brief atrial run.   ASSESSMENT & PLAN:    1. Paroxysmal atrial fibrillation She thinks she might have an episode of atrial fibrillation 2 weeks ago.  Today in sinus rhythm.  This could be due to stress, caffeine intake or possible UTI.  She will follow-up with PCP to rule out UTI.  Advised to avoid caffeine intake.  I had a long discussion about further treatment and evaluation.  Will wait and watch.  He had a reassuring work-up last year.  2. HTN - BP stable on current medications.   Medication Adjustments/Labs and Tests Ordered: Current medicines are reviewed at length with the patient today.  Concerns regarding medicines are outlined above.  Medication changes, Labs and  Tests ordered today are listed in the Patient Instructions below. Patient Instructions  Medication Instructions:  Your physician recommends that you continue on your current medications as directed. Please refer to the Current Medication list given to you today.  *If you need a refill on your cardiac medications before your next appointment, please call your pharmacy*  Lab Work: None ordered  If you have labs (blood work) drawn today and your tests are completely normal, you will receive your results only by: Marland Kitchen MyChart Message (if you have MyChart) OR . A paper copy in the mail If you have any lab test that is abnormal or we need to change your treatment, we will call you to review the results.  Testing/Procedures: None ordered  Follow-Up: At [facility], you and your health needs are our priority.  As part of our continuing mission to provide you with exceptional heart care, we have created designated Provider Care Teams.  These Care Teams include your primary Cardiologist (physician) and Advanced Practice Providers (APPs -  Physician Assistants and Nurse Practitioners) who all work together to provide you with the care you need, when you need it.  Your next appointment:   12 months  The format for your next appointment:   In Person  Provider:   You may see Sherren Mocha, MD or one of the following Advanced Practice Providers on your designated Care Team:    Richardson Dopp, PA-C  Vin Roselawn, [place]  Daune Perch, NP   Other Instructions      Signed, Leanor Kail, [place]  12/27/2018 [facility] [place], [place], [place]  [address] Phone: [phone]; Fax: [phone]

## 2018-12-27 NOTE — Patient Instructions (Signed)
Medication Instructions:  Your physician recommends that you continue on your current medications as directed. Please refer to the Current Medication list given to you today.  *If you need a refill on your cardiac medications before your next appointment, please call your pharmacy*  Lab Work: None ordered  If you have labs (blood work) drawn today and your tests are completely normal, you will receive your results only by: . MyChart Message (if you have MyChart) OR . A paper copy in the mail If you have any lab test that is abnormal or we need to change your treatment, we will call you to review the results.  Testing/Procedures: None ordered  Follow-Up: At [facility], you and your health needs are our priority.  As part of our continuing mission to provide you with exceptional heart care, we have created designated Provider Care Teams.  These Care Teams include your primary Cardiologist (physician) and Advanced Practice Providers (APPs -  Physician Assistants and Nurse Practitioners) who all work together to provide you with the care you need, when you need it.  Your next appointment:   12 month(s)  The format for your next appointment:   In Person  Provider:   You may see Michael Cooper, MD or one of the following Advanced Practice Providers on your designated Care Team:    Scott Weaver, PA-C  Vin Bhagat, PA-C  Janine Hammond, NP   Other Instructions   

## 2019-01-02 ENCOUNTER — Other Ambulatory Visit: Payer: Self-pay | Admitting: *Deleted

## 2019-01-02 MED ORDER — HYDROCHLOROTHIAZIDE 25 MG PO TABS: 25.0000 mg | ORAL_TABLET | Freq: Every day | ORAL | 3 refills | Status: DC

## 2019-01-03 ENCOUNTER — Other Ambulatory Visit: Payer: Self-pay | Admitting: *Deleted

## 2019-01-03 MED ORDER — HYDROCHLOROTHIAZIDE 25 MG PO TABS: 25.0000 mg | ORAL_TABLET | Freq: Every day | ORAL | 1 refills | Status: DC

## 2019-02-06 ENCOUNTER — Other Ambulatory Visit: Payer: Self-pay | Admitting: Cardiovascular Disease

## 2019-02-06 MED ORDER — HYDROCHLOROTHIAZIDE 25 MG PO TABS: 25.0000 mg | ORAL_TABLET | Freq: Every day | ORAL | 3 refills | Status: DC

## 2019-02-06 MED ORDER — CARVEDILOL 12.5 MG PO TABS: 12.5000 mg | ORAL_TABLET | Freq: Two times a day (BID) | ORAL | 3 refills | Status: DC

## 2019-02-06 MED ORDER — AMLODIPINE BESYLATE 5 MG PO TABS: 5.0000 mg | ORAL_TABLET | Freq: Every day | ORAL | 3 refills | Status: DC

## 2019-02-06 MED ORDER — POTASSIUM CHLORIDE CRYS ER 20 MEQ PO TBCR: 20.0000 meq | EXTENDED_RELEASE_TABLET | Freq: Two times a day (BID) | ORAL | 3 refills | Status: DC

## 2019-02-06 MED ORDER — LOSARTAN POTASSIUM 100 MG PO TABS: 100.0000 mg | ORAL_TABLET | Freq: Every day | ORAL | 3 refills | Status: DC

## 2019-02-08 ENCOUNTER — Other Ambulatory Visit: Payer: Self-pay | Admitting: Family Medicine

## 2019-02-08 DIAGNOSIS — Z1231 Encounter for screening mammogram for malignant neoplasm of breast: Secondary | ICD-10-CM

## 2019-02-23 ENCOUNTER — Ambulatory Visit: Payer: Managed Care, Other (non HMO) | Attending: Internal Medicine

## 2019-02-23 DIAGNOSIS — Z20822 Contact with and (suspected) exposure to covid-19: Secondary | ICD-10-CM

## 2019-02-24 LAB — NOVEL CORONAVIRUS, NAA: SARS-CoV-2, NAA: NOT DETECTED

## 2019-03-13 ENCOUNTER — Other Ambulatory Visit: Payer: Self-pay

## 2019-03-13 ENCOUNTER — Encounter: Payer: Self-pay | Admitting: Family Medicine

## 2019-03-13 ENCOUNTER — Ambulatory Visit: Payer: Managed Care, Other (non HMO) | Admitting: Family Medicine

## 2019-03-13 VITALS — BP 132/80 | HR 82 | Temp 97.7°F | Ht 65.0 in | Wt 163.8 lb

## 2019-03-13 DIAGNOSIS — Z0000 Encounter for general adult medical examination without abnormal findings: Secondary | ICD-10-CM

## 2019-03-13 DIAGNOSIS — N898 Other specified noninflammatory disorders of vagina: Secondary | ICD-10-CM

## 2019-03-13 DIAGNOSIS — G40909 Epilepsy, unspecified, not intractable, without status epilepticus: Secondary | ICD-10-CM | POA: Diagnosis not present

## 2019-03-13 DIAGNOSIS — E2839 Other primary ovarian failure: Secondary | ICD-10-CM | POA: Diagnosis not present

## 2019-03-13 DIAGNOSIS — F419 Anxiety disorder, unspecified: Secondary | ICD-10-CM | POA: Diagnosis not present

## 2019-03-13 DIAGNOSIS — I10 Essential (primary) hypertension: Secondary | ICD-10-CM

## 2019-03-13 DIAGNOSIS — Z23 Encounter for immunization: Secondary | ICD-10-CM

## 2019-03-13 DIAGNOSIS — Z113 Encounter for screening for infections with a predominantly sexual mode of transmission: Secondary | ICD-10-CM

## 2019-03-13 DIAGNOSIS — E559 Vitamin D deficiency, unspecified: Secondary | ICD-10-CM

## 2019-03-13 DIAGNOSIS — Z9109 Other allergy status, other than to drugs and biological substances: Secondary | ICD-10-CM

## 2019-03-13 MED ORDER — ALPRAZOLAM 0.25 MG PO TABS: 0.2500 mg | ORAL_TABLET | Freq: Two times a day (BID) | ORAL | 0 refills | Status: DC | PRN

## 2019-03-13 MED ORDER — SERTRALINE HCL 50 MG PO TABS: 50.0000 mg | ORAL_TABLET | Freq: Every day | ORAL | 3 refills | Status: DC

## 2019-03-13 NOTE — Patient Instructions (Addendum)
Start on 1/2 tablet of the sertraline for the first 7 days.  As long as you are doing well, you may increase to the full tablet after that. Take the Xanax as needed for severe anxiety. Avoid alcohol.  Follow-up with me in 2 weeks virtually let me know how you are doing.  For the allergy symptoms, you can try taking an over-the-counter nondrowsy antihistamine such as Claritin, Allegra, Xyzal or Zyrtec  Sure you are eating a healthy diet and get at least 150 minutes of physical activity per week.  We will be in touch with your results.    Preventive Care 55-82 Years Old, Female Preventive care refers to visits with your health care provider and lifestyle choices that can promote health and wellness. This includes:  A yearly physical exam. This may also be called an annual well check.  Regular dental visits and eye exams.  Immunizations.  Screening for certain conditions.  Healthy lifestyle choices, such as eating a healthy diet, getting regular exercise, not using drugs or products that contain nicotine and tobacco, and limiting alcohol use. What can I expect for my preventive care visit? Physical exam Your health care provider will check your:  Height and weight. This may be used to calculate body mass index (BMI), which tells if you are at a healthy weight.  Heart rate and blood pressure.  Skin for abnormal spots. Counseling Your health care provider may ask you questions about your:  Alcohol, tobacco, and drug use.  Emotional well-being.  Home and relationship well-being.  Sexual activity.  Eating habits.  Work and work Statistician.  Method of birth control.  Menstrual cycle.  Pregnancy history. What immunizations do I need?  Influenza (flu) vaccine  This is recommended every year. Tetanus, diphtheria, and pertussis (Tdap) vaccine  You may need a Td booster every 10 years. Varicella (chickenpox) vaccine  You may need this if you have not been  vaccinated. Zoster (shingles) vaccine  You may need this after age 43. Measles, mumps, and rubella (MMR) vaccine  You may need at least one dose of MMR if you were born in 1957 or later. You may also need a second dose. Pneumococcal conjugate (PCV13) vaccine  You may need this if you have certain conditions and were not previously vaccinated. Pneumococcal polysaccharide (PPSV23) vaccine  You may need one or two doses if you smoke cigarettes or if you have certain conditions. Meningococcal conjugate (MenACWY) vaccine  You may need this if you have certain conditions. Hepatitis A vaccine  You may need this if you have certain conditions or if you travel or work in places where you may be exposed to hepatitis A. Hepatitis B vaccine  You may need this if you have certain conditions or if you travel or work in places where you may be exposed to hepatitis B. Haemophilus influenzae type b (Hib) vaccine  You may need this if you have certain conditions. Human papillomavirus (HPV) vaccine  If recommended by your health care provider, you may need three doses over 6 months. You may receive vaccines as individual doses or as more than one vaccine together in one shot (combination vaccines). Talk with your health care provider about the risks and benefits of combination vaccines. What tests do I need? Blood tests  Lipid and cholesterol levels. These may be checked every 5 years, or more frequently if you are over 36 years old.  Hepatitis C test.  Hepatitis B test. Screening  Lung cancer screening. You may  have this screening every year starting at age 25 if you have a 30-pack-year history of smoking and currently smoke or have quit within the past 15 years.  Colorectal cancer screening. All adults should have this screening starting at age 68 and continuing until age 17. Your health care provider may recommend screening at age 68 if you are at increased risk. You will have tests every  1-10 years, depending on your results and the type of screening test.  Diabetes screening. This is done by checking your blood sugar (glucose) after you have not eaten for a while (fasting). You may have this done every 1-3 years.  Mammogram. This may be done every 1-2 years. Talk with your health care provider about when you should start having regular mammograms. This may depend on whether you have a family history of breast cancer.  BRCA-related cancer screening. This may be done if you have a family history of breast, ovarian, tubal, or peritoneal cancers.  Pelvic exam and Pap test. This may be done every 3 years starting at age 50. Starting at age 36, this may be done every 5 years if you have a Pap test in combination with an HPV test. Other tests  Sexually transmitted disease (STD) testing.  Bone density scan. This is done to screen for osteoporosis. You may have this scan if you are at high risk for osteoporosis. Follow these instructions at home: Eating and drinking  Eat a diet that includes fresh fruits and vegetables, whole grains, lean protein, and low-fat dairy.  Take vitamin and mineral supplements as recommended by your health care provider.  Do not drink alcohol if: ? Your health care provider tells you not to drink. ? You are pregnant, may be pregnant, or are planning to become pregnant.  If you drink alcohol: ? Limit how much you have to 0-1 drink a day. ? Be aware of how much alcohol is in your drink. In the [place] one drink equals one 12 oz bottle of beer (355 mL), one 5 oz glass of wine (148 mL), or one 1 oz glass of hard liquor (44 mL). Lifestyle  Take daily care of your teeth and gums.  Stay active. Exercise for at least 30 minutes on 5 or more days each week.  Do not use any products that contain nicotine or tobacco, such as cigarettes, e-cigarettes, and chewing tobacco. If you need help quitting, ask your health care provider.  If you are sexually active,  practice safe sex. Use a condom or other form of birth control (contraception) in order to prevent pregnancy and STIs (sexually transmitted infections).  If told by your health care provider, take low-dose aspirin daily starting at age 41. What's next?  Visit your health care provider once a year for a well check visit.  Ask your health care provider how often you should have your eyes and teeth checked.  Stay up to date on all vaccines. This information is not intended to replace advice given to you by your health care provider. Make sure you discuss any questions you have with your health care provider. Document Revised: 11/03/2017 Document Reviewed: 11/03/2017 Elsevier Patient Education  2020 [employer].

## 2019-03-13 NOTE — Progress Notes (Signed)
Subjective:    Patient ID: [name], female    DOB: 31-Jan-1961, 59 y.o.   MRN: [id number]  HPI Chief Complaint  Patient presents with  . fasting cpe    fasting cpe, trouble sleeping, wants something for her nerves due to boss, pap 2019   She is here for a complete physical exam and follow-up on chronic health conditions.  She has a new concern today regarding increased stress and anxiety related to work. Last CPE: 04/2017  Complains of increased anxiety is now affecting her and anxiety with work. Trouble sleeping and feeling on "pins and needles". States this is a new new problem for the past several months since having to work from home. She is having issues with sleeping.  She wakes up thinking about work.  Feels that her boss is being overbearing and very demanding.  States this was not the case when they were working together in office. States stress and anxiety is affecting her everyday life. Recalls a period in the past when she was stressed and took Valium for this.  States it was situational so when she removed herself from the situation, she no longer needing medication.  Lately she admits to ncreased alcohol use to help deal with her stress.  She does have people to vent to.  She has not been involved in counseling.  Is planning to move to [place] soon which she feels would help her current situation.  She also feels that once they are allowed to go back in the office, her situation would improve.  Other providers: Cardiologist- Dr. Burt Knack Neurologist- Dr. Clint Bolder Orthopedist- Dr. Salvad[facility]  Dr. Nyoka Cowden- dentist   Seizure disorder- is on [place] and followed by neurologist. States she had one seizure in 2014.  A-fib- not on anticoagulation due to seizure disorder per cardiologist notes.   She is followed by her cardiologist for hypertension, HL, history of A. Fib.  Reports she has been having episodes of bilateral ears itching, eyes watery and  itching. No Hx of allergies.    Social history: Her daughter is currently living with her who is due to have a baby in February. works as a Psychologist, counselling for [employer] She does smoke and has stopped in the past.  No recreational drug use Diet: Fairly healthy Excerise: Not as much as she would like  Immunizations: Tdap in the past 6 years. Flu shot- needs   Health maintenance:  Mammogram: appt later this month  Colonoscopy: 07/2018 Last Gynecological Exam: pap smear in 2019 Last Menstrual cycle: postmenopausal DEXA: 12/09/2017 Last Dental Exam: 2020   Wears seatbelt always, smoke detectors in home and functioning, does not text while driving and feels safe in home environment.   Reviewed allergies, medications, past medical, surgical, family, and social history.    Review of Systems Review of Systems Constitutional: -fever, -chills, -sweats, -unexpected weight change,-fatigue ENT: -runny nose, -ear pain, -sore throat Cardiology:  -chest pain, -palpitations, -edema Respiratory: -cough, -shortness of breath, -wheezing Gastroenterology: -abdominal pain, -nausea, -vomiting, -diarrhea, -constipation Hematology: -bleeding or bruising problems Musculoskeletal: -arthralgias, -myalgias, -joint swelling, -back pain Ophthalmology: -vision changes Urology: -dysuria, -difficulty urinating, -hematuria, -urinary frequency, -urgency Neurology: -headache, -weakness, -tingling, -numbness       Objective:   Physical Exam BP 132/80   Pulse 82   Temp 97.7 F (36.5 C)   Ht 5\' 5"  (1.651 m)   Wt 163 lb 12.8 oz (74.3 kg)   SpO2 99%  BMI 27.26 kg/m   General Appearance:    Alert, cooperative, no distress, appears stated age  Head:    Normocephalic, without obvious abnormality, atraumatic  Eyes:    PERRL, conjunctiva/corneas clear, EOM's intact  Ears:    Normal TM's and external ear canals  Nose:  Mask in place  Throat:  Mask in place  Neck:   Supple, no  lymphadenopathy;  thyroid:  no   enlargement/tenderness/nodules  Back:    Spine nontender, no curvature, ROM normal, no CVA     tenderness  Lungs:     Clear to auscultation bilaterally without wheezes, rales or     ronchi; respirations unlabored  Chest Wall:    No tenderness or deformity   Heart:    Regular rate and rhythm, S1 and S2 normal, no murmur, rub   or gallop  Breast Exam:   Not done.  Mammogram scheduled  Abdomen:     Soft, non-tender, nondistended, normoactive bowel sounds,    no masses, no hepatosplenomegaly  Genitalia:    Normal external genitalia without lesions.  BUS and vagina normal; small amount of white thin vaginal discharge.  Chaperone present     Extremities:   No clubbing, cyanosis or edema  Pulses:   2+ and symmetric all extremities  Skin:   Skin color, texture, turgor normal, no rashes or lesions  Lymph nodes:   Cervical, supraclavicular, and axillary nodes normal  Neurologic:   CNII-XII intact, normal strength, sensation and gait          Psych:   Normal mood, affect, hygiene and grooming.       Assessment & Plan:  Routine general medical examination at a health care facility - Plan: CBC with Differential, Comprehensive metabolic panel, Lipid Panel, TSH, T4, Free -Here today for a fasting CPE.  Preventive health care reviewed.  She is due for a mammogram and has this scheduled.  Colonoscopy up-to-date.  Pap smear up-to-date.  Counseling on healthy lifestyle including diet and exercise.  Immunizations reviewed.  Flu shot given and she is aware that she is most likely going to need a Tdap due to upcoming birth of grandchild grandchild.  Essential hypertension, benign - Plan: CBC with Differential, Comprehensive metabolic panel -Blood pressure in goal range.  She is followed by cardiology for hypertension  Estrogen deficiency-normal bone density   Seizure disorder (HCC) -Managed by neurology.  Good compliance with Keppra  Anxiety - Plan: sertraline (ZOLOFT) 50 MG  tablet, ALPRAZolam (XANAX) 0.25 MG tablet -In-depth counseling on coping with stress and not loading someone else's stress and anxiety play such a role in her own life.  Discussed outlets to relieve stress and to avoid self-medicating with alcohol.  Encouraged counseling.  She will start on 1/2 tablet of sertraline for the first week and increased dose as tolerated.  She may take Xanax initially if needed for severe anxiety but is aware that this is not for long-term treatment.  We will follow-up in 2 weeks to let me know how she is doing  Vitamin D deficiency - Plan: Vitamin D, 25-hydroxy -Follow-up pending vitamin D level.  Vaginal itching - Plan: NuSwab Vaginitis Plus (VG+) -Screening for STDs per patient request.  Follow-up pending results   Screen for STD (sexually transmitted disease) - Plan: NuSwab Vaginitis Plus (VG+), RPR, HIV antibody (with reflex)-done per patient request  Needs flu shot - Plan: Flu Vaccine QUAD 36+ mos PF IM (Fluarix & Fluzone Quad PF) -Discussed potential side effects including arm soreness.  She may take Tylenol if needed  Environmental allergies-suspect she is dealing with allergies based on symptoms when she leaves her house.  She may try a nondrowsy antihistamine such as Claritin, Allegra or Zyrtec.

## 2019-03-14 ENCOUNTER — Other Ambulatory Visit: Payer: Self-pay | Admitting: Family Medicine

## 2019-03-14 DIAGNOSIS — E559 Vitamin D deficiency, unspecified: Secondary | ICD-10-CM

## 2019-03-14 LAB — CBC WITH DIFFERENTIAL/PLATELET
Basophils Absolute: 0.1 10*3/uL (ref 0.0–0.2)
Basos: 1 %
EOS (ABSOLUTE): 0.2 10*3/uL (ref 0.0–0.4)
Eos: 2 %
Hematocrit: 39.5 % (ref 34.0–46.6)
Hemoglobin: 13.2 g/dL (ref 11.1–15.9)
Immature Grans (Abs): 0 10*3/uL (ref 0.0–0.1)
Immature Granulocytes: 0 %
Lymphocytes Absolute: 2.4 10*3/uL (ref 0.7–3.1)
Lymphs: 26 %
MCH: 28.5 pg (ref 26.6–33.0)
MCHC: 33.4 g/dL (ref 31.5–35.7)
MCV: 85 fL (ref 79–97)
Monocytes Absolute: 0.7 10*3/uL (ref 0.1–0.9)
Monocytes: 8 %
Neutrophils Absolute: 5.9 10*3/uL (ref 1.4–7.0)
Neutrophils: 63 %
Platelets: 255 10*3/uL (ref 150–450)
RBC: 4.63 x10E6/uL (ref 3.77–5.28)
RDW: 12.4 % (ref 11.7–15.4)
WBC: 9.2 10*3/uL (ref 3.4–10.8)

## 2019-03-14 LAB — VITAMIN D 25 HYDROXY (VIT D DEFICIENCY, FRACTURES): Vit D, 25-Hydroxy: 12.3 ng/mL — ABNORMAL LOW (ref 30.0–100.0)

## 2019-03-14 LAB — T4, FREE: Free T4: 0.99 ng/dL (ref 0.82–1.77)

## 2019-03-14 LAB — LIPID PANEL
Chol/HDL Ratio: 2.4 ratio (ref 0.0–4.4)
Cholesterol, Total: 184 mg/dL (ref 100–199)
HDL: 76 mg/dL (ref >39)
LDL Chol Calc (NIH): 91 mg/dL (ref 0–99)
Triglycerides: 96 mg/dL (ref 0–149)
VLDL Cholesterol Cal: 17 mg/dL (ref 5–40)

## 2019-03-14 LAB — COMPREHENSIVE METABOLIC PANEL
ALT: 22 IU/L (ref 0–32)
AST: 17 IU/L (ref 0–40)
Albumin/Globulin Ratio: 1.6 (ref 1.2–2.2)
Albumin: 4.6 g/dL (ref 3.8–4.9)
Alkaline Phosphatase: 57 IU/L (ref 39–117)
BUN/Creatinine Ratio: 10 — ABNORMAL LOW (ref 12–28)
BUN: 9 mg/dL (ref 8–27)
Bilirubin Total: 0.6 mg/dL (ref 0.0–1.2)
CO2: 26 mmol/L (ref 20–29)
Calcium: 9.8 mg/dL (ref 8.7–10.3)
Chloride: 100 mmol/L (ref 96–106)
Creatinine, Ser: 0.88 mg/dL (ref 0.57–1.00)
GFR calc Af Amer: 83 mL/min/{1.73_m2} (ref >59)
GFR calc non Af Amer: 72 mL/min/{1.73_m2} (ref >59)
Globulin, Total: 2.8 g/dL (ref 1.5–4.5)
Glucose: 124 mg/dL — ABNORMAL HIGH (ref 65–99)
Potassium: 3.9 mmol/L (ref 3.5–5.2)
Sodium: 138 mmol/L (ref 134–144)
Total Protein: 7.4 g/dL (ref 6.0–8.5)

## 2019-03-14 LAB — TSH: TSH: 1.5 u[IU]/mL (ref 0.450–4.500)

## 2019-03-14 LAB — RPR: RPR Ser Ql: NONREACTIVE

## 2019-03-14 LAB — HIV ANTIBODY (ROUTINE TESTING W REFLEX): HIV Screen 4th Generation wRfx: NONREACTIVE

## 2019-03-14 MED ORDER — VITAMIN D (ERGOCALCIFEROL) 1.25 MG (50000 UNIT) PO CAPS: 50000.0000 [IU] | ORAL_CAPSULE | ORAL | 0 refills | Status: DC

## 2019-03-15 ENCOUNTER — Other Ambulatory Visit: Payer: Self-pay | Admitting: Family Medicine

## 2019-03-15 DIAGNOSIS — B9689 Other specified bacterial agents as the cause of diseases classified elsewhere: Secondary | ICD-10-CM

## 2019-03-15 LAB — NUSWAB VAGINITIS PLUS (VG+)
Atopobium vaginae: HIGH Score — AB
BVAB 2: HIGH Score — AB
Candida albicans, NAA: NEGATIVE
Candida glabrata, NAA: NEGATIVE
Chlamydia trachomatis, NAA: NEGATIVE
Megasphaera 1: HIGH Score — AB
Neisseria gonorrhoeae, NAA: NEGATIVE
Trich vag by NAA: NEGATIVE

## 2019-03-15 MED ORDER — METRONIDAZOLE 500 MG PO TABS: 500.0000 mg | ORAL_TABLET | Freq: Two times a day (BID) | ORAL | 0 refills | Status: DC

## 2019-03-19 NOTE — Progress Notes (Signed)
Virtual Visit via Telephone Note   This visit type was conducted due to national recommendations for restrictions regarding the COVID-19 Pandemic (e.g. social distancing) in an effort to limit this patient's exposure and mitigate transmission in our community.  Due to her co-morbid illnesses, this patient is at least at moderate risk for complications without adequate follow up.  This format is felt to be most appropriate for this patient at this time.  The patient did not have access to video technology/had technical difficulties with video requiring transitioning to audio format only (telephone).  All issues noted in this document were discussed and addressed.  No physical exam could be performed with this format.  Please refer to the patient's chart for her  consent to telehealth for [facility].   Date:  03/20/2019   ID:  [name], DOB Aug 23, 1960, MRN [id number]  Patient Location: Home Provider Location: Office  PCP:  Avanell Shackleton, NP-C  Cardiologist:  Tonny Bollman, MD  Electrophysiologist:  None   Evaluation Performed:  Follow-Up Visit  Chief Complaint:  3 months follow up   History of Present Illness:    [name] is a 59 y.o. female with hx of HTN and PAF seen for follow up.   The patient has a remote history of atrial fibrillation with no evidence of this over recent years. She has never been anticoagulated. He complained palpitations when seen by Dr. Excell Seltzer 08/2017. Had palpitation. EKG with sinus rhythm. Benign holder monitor. Echo showed normal LVEF and grade 2 DD.   Last seen by me 12/2018. Had intermittent fluttering sensation. Advised to avoid caffeinated product. She wanted to wait and watch.   Here today for follow up. Takes her medications. No further palpitations since cut back on coffee. The patient denies nausea, vomiting, fever, chest pain, palpitations, shortness of breath, orthopnea, PND, dizziness, syncope, cough, congestion, abdominal pain,  hematochezia, melena, lower extremity edema.  The patient does not have symptoms concerning for COVID-19 infection (fever, chills, cough, or new shortness of breath).    Past Medical History:  Diagnosis Date  . Atrial fibrillation (HCC)   . Back pain   . History of echocardiogram    Echo 5/17 - mild concentric LVH, vigorous LVEF, EF 65-70%, no RWMA, Gr 1 DD, normal RVEF, normal bilat atrial size  . Hypertension   . Seizures (HCC)    around 2014   Past Surgical History:  Procedure Laterality Date  . CESAREAN SECTION       Current Meds  Medication Sig  . ALPRAZolam (XANAX) 0.25 MG tablet Take 1 tablet (0.25 mg total) by mouth 2 (two) times daily as needed for anxiety.  Marland Kitchen amLODipine (NORVASC) 5 MG tablet Take 1 tablet (5 mg total) by mouth daily.  . carvedilol (COREG) 12.5 MG tablet Take 1 tablet (12.5 mg total) by mouth 2 (two) times daily with a meal.  . hydrochlorothiazide (HYDRODIURIL) 25 MG tablet Take 1 tablet (25 mg total) by mouth daily.  Marland Kitchen levETIRAcetam (KEPPRA) 500 MG tablet Take 1 tablet (500 mg total) by mouth 2 (two) times daily.  Marland Kitchen losartan (COZAAR) 100 MG tablet Take 1 tablet (100 mg total) by mouth daily.  . metroNIDAZOLE (FLAGYL) 500 MG tablet Take 1 tablet (500 mg total) by mouth 2 (two) times daily.  . Multiple Vitamins-Minerals (MULTIVITAMIN WITH MINERALS) tablet Take 1 tablet by mouth daily.  . potassium chloride SA (KLOR-CON) 20 MEQ tablet Take 1 tablet (20 mEq total) by mouth 2 (two) times daily.  Marland Kitchen  Vitamin D, Ergocalciferol, (DRISDOL) 1.25 MG (50000 UT) CAPS capsule Take 1 capsule (50,000 Units total) by mouth every 7 (seven) days.  . [DISCONTINUED] ibuprofen (ADVIL,MOTRIN) 800 MG tablet Take 800 mg by mouth every 8 (eight) hours as needed.     Allergies:   Penicillins   Social History   Tobacco Use  . Smoking status: Former Smoker    Packs/day: 0.75    Years: 37.00    Pack years: 27.75    Types: Cigarettes    Quit date: 10/12/2016    Years since  quitting: 2.4  . Smokeless tobacco: Never Used  Substance Use Topics  . Alcohol use: Yes    Alcohol/week: 0.0 standard drinks    Comment: ocassionally on weekends   . Drug use: No     Family Hx: The patient's family history includes Heart attack (age of onset: 99) in her mother; Hypertension in her brother, brother, brother, mother, and son; Seizures in her sister; Sickle cell anemia in her mother; Sickle cell trait in her sister and sister.  ROS:   Please see the history of present illness.    All other systems reviewed and are negative.   Prior CV studies:   The following studies were reviewed today:  Echocardiogram: 09/06/2017 - Left ventricle: The cavity size was normal. Wall thickness was normal. Systolic function was normal. The estimated ejection fraction was in the range of 60% to 65%. Wall motion was normal; there were no regional wall motion abnormalities. Features are consistent with a pseudonormal left ventricular filling pattern, with concomitant abnormal relaxation and increased filling pressure (grade 2 diastolic dysfunction).  48 holter monitor  The patient was monitored for 48 hours.  The predominant rhythm was sinus with an average rate of 81 bpm (range 57-119 bpm). The longest R-R interval was 1.1 seconds.  Rare PAC's and PVC's were observed. A single atrial run lasting 3 beats was noted.  There was no sustained arrhythmia or prolonged pause.  Predominantly sinus rhythm with rare PAC's and PVC's, as well as a single brief atrial run.  Labs/Other Tests and Data Reviewed:    EKG:  EKG 12/27/18 : SR, LVH with repolarization abnormality - personally reviewed   Recent Labs: 03/13/2019: ALT 22; BUN 9; Creatinine, Ser 0.88; Hemoglobin 13.2; Platelets 255; Potassium 3.9; Sodium 138; TSH 1.500   Recent Lipid Panel Lab Results  Component Value Date/Time   CHOL 184 03/13/2019 10:44 AM   TRIG 96 03/13/2019 10:44 AM   HDL 76 03/13/2019 10:44 AM    CHOLHDL 2.4 03/13/2019 10:44 AM   LDLCALC 91 03/13/2019 10:44 AM    Wt Readings from Last 3 Encounters:  03/20/19 163 lb 12.8 oz (74.3 kg)  03/13/19 163 lb 12.8 oz (74.3 kg)  12/27/18 163 lb (73.9 kg)     Objective:    Vital Signs:  Ht 5\' 5"  (1.651 m)   Wt 163 lb 12.8 oz (74.3 kg)   BMI 27.26 kg/m   BP of 122/64  VITAL SIGNS:  reviewed GEN:  no acute distress PSYCH:  normal affect  ASSESSMENT & PLAN:    1. PAF - No recurrent palpations. Continue BB  2. HTN - BP stable on current medications. No change.   COVID-19 Education: The signs and symptoms of COVID-19 were discussed with the patient and how to seek care for testing (follow up with PCP or arrange E-visit).  The importance of social distancing was discussed today.  Time:   Today, I have spent 7 minutes  with the patient with telehealth technology discussing the above problems.     Medication Adjustments/Labs and Tests Ordered: Current medicines are reviewed at length with the patient today.  Concerns regarding medicines are outlined above.   Tests Ordered: No orders of the defined types were placed in this encounter.   Medication Changes: No orders of the defined types were placed in this encounter.   Follow Up:  Either In Person or Virtual in 1 year(s)  Signed, Manson Passey, PA  03/20/2019 2:16 PM    [facility]

## 2019-03-20 ENCOUNTER — Encounter: Payer: Self-pay | Admitting: Physician Assistant

## 2019-03-20 ENCOUNTER — Telehealth (INDEPENDENT_AMBULATORY_CARE_PROVIDER_SITE_OTHER): Payer: Managed Care, Other (non HMO) | Admitting: Physician Assistant

## 2019-03-20 ENCOUNTER — Other Ambulatory Visit: Payer: Self-pay

## 2019-03-20 VITALS — Ht 65.0 in | Wt 163.8 lb

## 2019-03-20 DIAGNOSIS — I10 Essential (primary) hypertension: Secondary | ICD-10-CM

## 2019-03-20 DIAGNOSIS — I480 Paroxysmal atrial fibrillation: Secondary | ICD-10-CM

## 2019-03-20 NOTE — Patient Instructions (Signed)
Medication Instructions:  Your physician recommends that you continue on your current medications as directed. Please refer to the Current Medication list given to you today.  *If you need a refill on your cardiac medications before your next appointment, please call your pharmacy*  Lab Work: None ordered  If you have labs (blood work) drawn today and your tests are completely normal, you will receive your results only by: . MyChart Message (if you have MyChart) OR . A paper copy in the mail If you have any lab test that is abnormal or we need to change your treatment, we will call you to review the results.  Testing/Procedures: None ordered  Follow-Up: At [facility], you and your health needs are our priority.  As part of our continuing mission to provide you with exceptional heart care, we have created designated Provider Care Teams.  These Care Teams include your primary Cardiologist (physician) and Advanced Practice Providers (APPs -  Physician Assistants and Nurse Practitioners) who all work together to provide you with the care you need, when you need it.  Your next appointment:   12 month(s)  The format for your next appointment:   In Person  Provider:   You may see Michael Cooper, MD or one of the following Advanced Practice Providers on your designated Care Team:    Scott Weaver, PA-C  Vin Bhagat, PA-C  Janine Hammond, NP   Other Instructions   

## 2019-03-29 ENCOUNTER — Ambulatory Visit: Payer: Managed Care, Other (non HMO) | Admitting: Family Medicine

## 2019-03-29 ENCOUNTER — Other Ambulatory Visit: Payer: Self-pay

## 2019-03-29 ENCOUNTER — Encounter: Payer: Self-pay | Admitting: Family Medicine

## 2019-03-29 VITALS — Wt 162.0 lb

## 2019-03-29 DIAGNOSIS — F419 Anxiety disorder, unspecified: Secondary | ICD-10-CM | POA: Diagnosis not present

## 2019-03-29 NOTE — Progress Notes (Signed)
   Subjective:  Documentation for virtual audio and video telecommunications through [employer] encounter:  The patient was located at home. 2 patient identifiers used.  The provider was located at home.  The patient did consent to this visit and is aware of possible charges through their insurance for this visit.  The other persons participating in this telemedicine service were none.    Patient ID: [name], female    DOB: Jul 16, 1960, 59 y.o.   MRN: [id number]  HPI Chief Complaint  Patient presents with  . follow-up    follow-up on anxiety   This is a 2 week follow up on anxiety.   Prescribed sertraline and Xanax and she has not started the medication. States she read the insert and is afraid of becoming "addicted" to it. Her sister is on medication and has been for years. States she does not want to need the medication to function or to have it change her.  States she gets panicky at times when driving. Stress with work.   No new concerns.  Physically well.    Review of Systems Pertinent positives and negatives in the history of present illness.     Objective:   Physical Exam Wt 162 lb (73.5 kg)   BMI 26.96 kg/m   Alert and oriented and in no acute distress.       Assessment & Plan:  Anxiety  In her usual state of health.  She has reservations about starting on sertraline.  Counseling on how SSRIs work in the system and potential side effects. Reassured her about the medication. If she decides to start taking it she will only take one half tablet and let me know how she is doing after a couple of weeks. Advised her to avoid driving with Xanax.  Follow up as needed.   Time spent on call was 12 minutes and in review of previous records 15 minutes total.  This virtual service is not related to other E/M service within previous 7 days.

## 2019-03-30 ENCOUNTER — Ambulatory Visit
Admission: RE | Admit: 2019-03-30 | Discharge: 2019-03-30 | Disposition: A | Discharge status: Home / Self Care | Payer: Managed Care, Other (non HMO) | Source: Ambulatory Visit | Attending: Family Medicine | Admitting: Family Medicine

## 2019-03-30 ENCOUNTER — Other Ambulatory Visit: Payer: Self-pay

## 2019-03-30 DIAGNOSIS — Z1231 Encounter for screening mammogram for malignant neoplasm of breast: Secondary | ICD-10-CM

## 2019-06-10 ENCOUNTER — Other Ambulatory Visit: Payer: Self-pay | Admitting: Family Medicine

## 2019-06-10 DIAGNOSIS — E559 Vitamin D deficiency, unspecified: Secondary | ICD-10-CM

## 2019-06-11 NOTE — Telephone Encounter (Signed)
Is this okay to refill? 

## 2019-06-22 ENCOUNTER — Telehealth: Payer: Managed Care, Other (non HMO) | Admitting: Family Medicine

## 2019-06-22 ENCOUNTER — Other Ambulatory Visit: Payer: Self-pay

## 2019-06-22 ENCOUNTER — Encounter: Payer: Self-pay | Admitting: Family Medicine

## 2019-06-22 VITALS — BP 125/75 | HR 80 | Wt 162.0 lb

## 2019-06-22 DIAGNOSIS — H1031 Unspecified acute conjunctivitis, right eye: Secondary | ICD-10-CM | POA: Diagnosis not present

## 2019-06-22 DIAGNOSIS — J301 Allergic rhinitis due to pollen: Secondary | ICD-10-CM

## 2019-06-22 MED ORDER — ERYTHROMYCIN 5 MG/GM OP OINT: 1.0000 "application " | TOPICAL_OINTMENT | Freq: Three times a day (TID) | OPHTHALMIC | 0 refills | Status: DC

## 2019-06-22 NOTE — Progress Notes (Signed)
   Subjective:    Patient ID: [name], female    DOB: 04/08/1960, 59 y.o.   MRN: [id number]  HPI We connected via MyChart for this visit.  She has a history of allergies as well as conjunctivitis.  She has noted drainage in the left eye and now both eyes as well as itchy watery eyes and some crusting.  She has been using erythromycin ointment and would like a refill on that.   Review of Systems     Objective:   Physical Exam Alert and in no distress.  Could not assess her eyes via the video.      Assessment & Plan:  Acute conjunctivitis of right eye, unspecified acute conjunctivitis type - Plan: erythromycin ophthalmic ointment  Seasonal allergic rhinitis due to pollen

## 2019-08-15 ENCOUNTER — Ambulatory Visit: Payer: Managed Care, Other (non HMO) | Admitting: Family Medicine

## 2019-08-15 ENCOUNTER — Encounter: Payer: Self-pay | Admitting: Family Medicine

## 2019-08-15 ENCOUNTER — Ambulatory Visit
Admission: RE | Admit: 2019-08-15 | Discharge: 2019-08-15 | Disposition: A | Discharge status: Home / Self Care | Payer: Managed Care, Other (non HMO) | Source: Ambulatory Visit | Attending: Pain Medicine | Admitting: Pain Medicine

## 2019-08-15 ENCOUNTER — Other Ambulatory Visit: Payer: Self-pay | Admitting: Pain Medicine

## 2019-08-15 VITALS — BP 131/87 | HR 89 | Ht 64.0 in | Wt 162.0 lb

## 2019-08-15 DIAGNOSIS — M5489 Other dorsalgia: Secondary | ICD-10-CM

## 2019-08-15 DIAGNOSIS — G40909 Epilepsy, unspecified, not intractable, without status epilepticus: Secondary | ICD-10-CM | POA: Diagnosis not present

## 2019-08-15 MED ORDER — LEVETIRACETAM 500 MG PO TABS: 500.0000 mg | ORAL_TABLET | Freq: Two times a day (BID) | ORAL | 3 refills | Status: DC

## 2019-08-15 NOTE — Progress Notes (Signed)
PATIENT: [name] DOB: 1960/09/12  REASON FOR VISIT: follow up HISTORY FROM: patient  Chief Complaint  Patient presents with  . Follow-up    rm 2 here for seizure f/u. Pt said she is having dizziness.     HISTORY OF PRESENT ILLNESS: Today 08/15/19 [name] is a 59 y.o. female here today for follow up for seizures. She continues levetiracetam 500mg  twice daily. She denies seizure activity. She does note intermittent dizziness. BP has been up and down. She is followed by cardiology closely. She is under more stress at work. PCP advised sertraline and alprazolam. She has not started these medications.   HISTORY: (copied from my note on 06/15/2018)  [name] is a 59 y.o. female for follow up of seizure.  She reports that she is doing very well on levetiracetam 500 mg twice daily.  She does admit that she has taken the second dose of medication later than she should have.  She denies missed doses.  She denies any seizure activity. She denies any concerns at this time.    HISTORY (copied from 46 note on 05/04/2017)  [name], [name] female returns for follow-up with history of complex partial seizure disorder. Last seizure activity September 2013. She is currently well controlled on Levetiracetam500 mg twice daily.She denies side effects to the medication. No other medical issues since last seen. She returns for reevaluation  UPDATE 11/11/15: Since last visit, doing well. No sz. Patient may be moving to [place]. Otherwise doing well.  UPDATE 05/22/14: Since last visit, doing well. No seizures. Some intermittent eyelid and lip twitching. Int toe spasms. Overall doing well.   UPDATE 05/22/13: Doing well. No seizures since 11/11/11. Now on better schedule of LEV 500mg  BID, without missing doses. 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 This is challenging and has caused her increased  stress. Also, her younger brother also diagnosed with seizure disorder now.  UPDATE 11/15/11: Felt on Thurs (11/11/11) had feelings of having seizure; she felt "out of body". She was in the car and she had someone to come and pick her up. She was aware of the situation. She has missed an average of 3 hs doses/wk of LEV 500mg . She has had increased stress with her mother's declining health. Having difficulty with putting thoughts together and repeating words. She is also having increased anxiety.   UPDATE 01/05/11: Doing well. Tolerating meds. Back to driving. No seizure since Jun 24, 2009.  UPDATE 11/04/09: Doing well; no further seizures. Having intermittent twitching in toes up to thighs; sometimes bilateral, sometimes unilateral; no triggers. Still with short temper.  PRIOR HPI (07/22/09): 59 year old right-handed female with hypertension and atrial fibrillation presenting for evaluation of possible seizure disorder. The patient has had 3 episodes in her life of altered or loss of consciousness. The first episode occurred in the 1990s when she was a mall and a "strange feeling" with pain over her. She felt as though she was about to pass out. She was able to sit down and did not lose consciousness. She was somewhat confused. Her brother came to pick her up and she did not seek medical attention. The second episode occurred in 2004 when she woke up in the middle of the night feeling very hot. She went to the kitchen and the next thing she remembers is on the floor with her daughter standing over her. She was evaluated in the hospital MRI of the brain  revealed a right parietal periventricular focus of gliosis (which I reviewed and agree with report). Followup MRIs demonstrated stability of this lesion. June 24, 2009 the patient was at the beauty salon, when she all of a sudden she began to profusely sweating. She felt as though she was about to go into the base. In PT she gave her a walker she  was able to drink and she ended up going into a "stare" and that her eyes rolled back and she began to shake all over. Unclear what the duration of this event was. She did not get her tongue. He did not have urinary or bowel incontinence. Paramedics were called to the scene and reportedly she had a "weak pulse" (no documentation of this). She was evaluated at [facility] emergency room with CT scan and MRI of the brain, which showed stability of the right parietal gliosis (I reviewed images and agree). I discussed case with the emergency room clinicians in the evening patient was started on levetiracetam 500 mg BID. Since discharge she is had no further events. She does have some fatigue and mood swings with this medication. In retrospect patient has been having episodes of dj vu and out of body experience. She denies olfactory hallucinations. She has a family history of seizure (sister). She denies any prior head trauma. Denies meningitis or encephalitis. She did well in school (honor roll). She is not driving presently   REVIEW OF SYSTEMS: Out of a complete 14 system review of symptoms, the patient complains only of the following symptoms, dizziness, seizure and all other reviewed systems are negative.  ALLERGIES: Allergies  Allergen Reactions  . Penicillins Hives and Rash    Has patient had a PCN reaction causing immediate rash, facial/tongue/throat swelling, SOB or lightheadedness with hypotension: yes Has patient had a PCN reaction causing severe rash involving mucus membranes or skin necrosis: No Has patient had a PCN reaction that required hospitalization No Has patient had a PCN reaction occurring within the last 10 years: No If all of the above answers are "NO", then may proceed with Cephalosporin use.     HOME MEDICATIONS: Outpatient Medications Prior to Visit  Medication Sig Dispense Refill  . ALPRAZolam (XANAX) 0.25 MG tablet Take 1 tablet (0.25 mg total) by mouth 2 (two) times  daily as needed for anxiety. 12 tablet 0  . amLODipine (NORVASC) 5 MG tablet Take 1 tablet (5 mg total) by mouth daily. 90 tablet 3  . aspirin EC 81 MG tablet Take 1 tablet (81 mg total) by mouth daily. 90 tablet 3  . carvedilol (COREG) 12.5 MG tablet Take 1 tablet (12.5 mg total) by mouth 2 (two) times daily with a meal. 180 tablet 3  . hydrochlorothiazide (HYDRODIURIL) 25 MG tablet Take 1 tablet (25 mg total) by mouth daily. 90 tablet 3  . losartan (COZAAR) 100 MG tablet Take 1 tablet (100 mg total) by mouth daily. 90 tablet 3  . Multiple Vitamins-Minerals (MULTIVITAMIN WITH MINERALS) tablet Take 1 tablet by mouth daily.    . potassium chloride SA (KLOR-CON) 20 MEQ tablet Take 1 tablet (20 mEq total) by mouth 2 (two) times daily. 180 tablet 3  . sertraline (ZOLOFT) 50 MG tablet Take 1 tablet (50 mg total) by mouth daily. 30 tablet 3  . levETIRAcetam (KEPPRA) 500 MG tablet Take 1 tablet (500 mg total) by mouth 2 (two) times daily. 180 tablet 3  . erythromycin ophthalmic ointment Place 1 application into both eyes 3 (three) times daily.  3.5 g 0  . Vitamin D, Ergocalciferol, (DRISDOL) 1.25 MG (50000 UNIT) CAPS capsule TAKE 1 CAPSULE BY MOUTH EVERY 7 DAYS 12 capsule 0   No facility-administered medications prior to visit.    PAST MEDICAL HISTORY: Past Medical History:  Diagnosis Date  . Atrial fibrillation ([place])   . Back pain   . History of echocardiogram    Echo 5/17 - mild concentric LVH, vigorous LVEF, EF 65-70%, no RWMA, Gr 1 DD, normal RVEF, normal bilat atrial size  . Hypertension   . Seizures ([place])    around 2014    PAST SURGICAL HISTORY: Past Surgical History:  Procedure Laterality Date  . CESAREAN SECTION      FAMILY HISTORY: Family History  Problem Relation Age of Onset  . Sickle cell trait Sister   . Hypertension Brother   . Sickle cell anemia Mother   . Hypertension Mother   . Heart attack Mother 36  . Seizures Sister   . Sickle cell trait Sister   . Hypertension  Brother   . Hypertension Brother   . Hypertension Son     SOCIAL HISTORY: Social History   Socioeconomic History  . Marital status: Single    Spouse name: Randall Hiss  . Number of children: 2  . Years of education: College  . Highest education level: Not on file  Occupational History  . Occupation: health care billing specialist    Employer: OTHER    Comment: [employer]  Tobacco Use  . Smoking status: Former Smoker    Packs/day: 0.75    Years: 37.00    Pack years: 27.75    Types: Cigarettes    Quit date: 10/12/2016    Years since quitting: 2.8  . Smokeless tobacco: Never Used  Substance and Sexual Activity  . Alcohol use: Yes    Alcohol/week: 0.0 standard drinks    Comment: ocassionally on weekends   . Drug use: No  . Sexual activity: Yes    Partners: Male    Birth control/protection: Post-menopausal, Surgical    Comment: one partner since 2003  Other Topics Concern  . Not on file  Social History Narrative   Patient lives with her daughter.   Her son lives in [place], independently.   Caffeine Use: 2-3 cups daily Coffee   Social Determinants of Health   Financial Resource Strain:   . Difficulty of Paying Living Expenses:   Food Insecurity:   . Worried About Charity fundraiser in the Last Year:   . Arboriculturist in the Last Year:   Transportation Needs:   . Film/video editor (Medical):   Marland Kitchen Lack of Transportation (Non-Medical):   Physical Activity:   . Days of Exercise per Week:   . Minutes of Exercise per Session:   Stress:   . Feeling of Stress :   Social Connections:   . Frequency of Communication with Friends and Family:   . Frequency of Social Gatherings with Friends and Family:   . Attends Religious Services:   . Active Member of Clubs or Organizations:   . Attends Archivist Meetings:   Marland Kitchen Marital Status:   Intimate Partner Violence:   . Fear of Current or Ex-Partner:   . Emotionally Abused:   Marland Kitchen Physically Abused:   . Sexually Abused:        PHYSICAL EXAM  Vitals:   08/15/19 1530 08/15/19 1532  BP: (!) 159/97 131/87  Pulse: 80 89  Weight: 162 lb (73.5 kg)  Height: 5\' 4"  (1.626 m)    Body mass index is 27.81 kg/m.  Generalized: Well developed, in no acute distress  Cardiology: normal rate and rhythm, no murmur noted Respiratory: clear to auscultation bilaterally  Neurological examination  Mentation: Alert oriented to time, place, history taking. Follows all commands speech and language fluent Cranial nerve II-XII: Pupils were equal round reactive to light. Extraocular movements were full, visual field were full on confrontational test. Facial sensation and strength were normal. Head turning and shoulder shrug  were normal and symmetric. Motor: The motor testing reveals 5 over 5 strength of all 4 extremities. Good symmetric motor tone is noted throughout.  Sensory: Sensory testing is intact to soft touch on all 4 extremities. No evidence of extinction is noted.  Coordination: Cerebellar testing reveals good finger-nose-finger and heel-to-shin bilaterally.  Gait and station: Gait is normal.   DIAGNOSTIC DATA (LABS, IMAGING, TESTING) - I reviewed patient records, labs, notes, testing and imaging myself where available.  No flowsheet data found.   Lab Results  Component Value Date   WBC 9.2 03/13/2019   HGB 13.2 03/13/2019   HCT 39.5 03/13/2019   MCV 85 03/13/2019   PLT 255 03/13/2019      Component Value Date/Time   NA 138 03/13/2019 1044   K 3.9 03/13/2019 1044   CL 100 03/13/2019 1044   CO2 26 03/13/2019 1044   GLUCOSE 124 (H) 03/13/2019 1044   GLUCOSE 102 (H) 09/22/2016 1314   BUN 9 03/13/2019 1044   CREATININE 0.88 03/13/2019 1044   CREATININE 0.83 06/23/2015 1233   CALCIUM 9.8 03/13/2019 1044   PROT 7.4 03/13/2019 1044   ALBUMIN 4.6 03/13/2019 1044   AST 17 03/13/2019 1044   ALT 22 03/13/2019 1044   ALKPHOS 57 03/13/2019 1044   BILITOT 0.6 03/13/2019 1044   GFRNONAA 72 03/13/2019 1044    GFRAA 83 03/13/2019 1044   Lab Results  Component Value Date   CHOL 184 03/13/2019   HDL 76 03/13/2019   LDLCALC 91 03/13/2019   TRIG 96 03/13/2019   CHOLHDL 2.4 03/13/2019   No results found for: HGBA1C No results found for: VITAMINB12 Lab Results  Component Value Date   TSH 1.500 03/13/2019       ASSESSMENT AND PLAN 59 y.o. year old female  has a past medical history of Atrial fibrillation (HCC), Back pain, History of echocardiogram, Hypertension, and Seizures (HCC). here with     ICD-10-CM   1. Seizure disorder (HCC)  G40.909     [name] is doing well today.  She continues to tolerate levetiracetam 500 mg twice daily.  We will continue current treatment plan.  She was encouraged to avoid missed doses of medication.  We have discussed concerns of dizziness.  Blood pressure in the office was initially 159/97.  Recheck is 130/80.  Dizziness seems to be consistent with variable blood pressures.  She is followed closely by cardiology.  She monitors her blood pressures closely at home.  She was encouraged to discuss this with primary care or cardiology should she feel that symptoms continue.  We have discussed concerns of anxiety.  I have educated her on appropriate use of Zoloft and Xanax.  I encouraged her to consider these as directed by primary care.  We have also discussed trying melatonin or valerian root over-the-counter.  She is encouraged to stay well-hydrated.  Regular exercise advised.  She will follow-up with me in 1 year, sooner if needed.  She verbalizes understanding and agreement  with this plan.   No orders of the defined types were placed in this encounter.    Meds ordered this encounter  Medications  . levETIRAcetam (KEPPRA) 500 MG tablet    Sig: Take 1 tablet (500 mg total) by mouth 2 (two) times daily.    Dispense:  180 tablet    Refill:  3    Order Specific Question:   Supervising Provider    Answer:   Anson Fret [id number]      I spent 20 minutes  with the patient. 50% of this time was spent counseling and educating patient on plan of care and medications.    Shawnie Dapper, FNP-C 08/15/2019, 4:00 PM [facility] [address], [address] [place], [place] [address] [phone]

## 2019-08-15 NOTE — Patient Instructions (Signed)
We will continue levetiracetam 500mg  twice daily.   Consider Zoloft as directed by PCP. I like melatonin or valerian root as well. You can get either of these over the counter. Try to stay well hydrated. Try to exercise regularly.   Follow up in 1 year     Seizure, Adult A seizure is a sudden burst of abnormal electrical activity in the brain. Seizures usually last from 30 seconds to 2 minutes. They can cause many different symptoms. Usually, seizures are not harmful unless they last a long time. What are the causes? Common causes of this condition include:  Fever or infection.  Conditions that affect the brain, such as: ? A brain abnormality that you were born with. ? A brain or head injury. ? Bleeding in the brain. ? A tumor. ? Stroke. ? Brain disorders such as autism or cerebral palsy.  Low blood sugar.  Conditions that are passed from parent to child (are inherited).  Problems with substances, such as: ? Having a reaction to a drug or a medicine. ? Suddenly stopping the use of a substance (withdrawal). In some cases, the cause may not be known. A person who has repeated seizures over time without a clear cause has a condition called epilepsy. What increases the risk? You are more likely to get this condition if you have:  A family history of epilepsy.  Had a seizure in the past.  A brain disorder.  A history of head injury, lack of oxygen at birth, or strokes. What are the signs or symptoms? There are many types of seizures. The symptoms vary depending on the type of seizure you have. Examples of symptoms during a seizure include:  Shaking (convulsions).  Stiffness in the body.  Passing out (losing consciousness).  Head nodding.  Staring.  Not responding to sound or touch.  Loss of bladder control and bowel control. Some people have symptoms right before and right after a seizure happens. Symptoms before a seizure may include:  Fear.  Worry  (anxiety).  Feeling like you may vomit (nauseous).  Feeling like the room is spinning (vertigo).  Feeling like you saw or heard something before (dj vu).  Odd tastes or smells.  Changes in how you see. You may see flashing lights or spots. Symptoms after a seizure happens can include:  Confusion.  Sleepiness.  Headache.  Weakness on one side of the body. How is this treated? Most seizures will stop on their own in under 5 minutes. In these cases, no treatment is needed. Seizures that last longer than 5 minutes will usually need treatment. Treatment can include:  Medicines given through an IV tube.  Avoiding things that are known to cause your seizures. These can include medicines that you take for another condition.  Medicines to treat epilepsy.  Surgery to stop the seizures. This may be needed if medicines do not help. Follow these instructions at home: Medicines  Take over-the-counter and prescription medicines only as told by your doctor.  Do not eat or drink anything that may keep your medicine from working, such as alcohol. Activity  Do not do any activities that would be dangerous if you had another seizure, like driving or swimming. Wait until your doctor says it is safe for you to do them.  If you live in the [place] ask your local DMV (department of motor vehicles) when you can drive.  Get plenty of rest. Teaching others Teach friends and family what to do when you have a  seizure. They should:  Lay you on the ground.  Protect your head and body.  Loosen any tight clothing around your neck.  Turn you on your side.  Not hold you down.  Not put anything into your mouth.  Know whether or not you need emergency care.  Stay with you until you are better.  General instructions  Contact your doctor each time you have a seizure.  Avoid anything that gives you seizures.  Keep a seizure diary. Write down: ? What you think caused each seizure. ? What  you remember about each seizure.  Keep all follow-up visits as told by your doctor. This is important. Contact a doctor if:  You have another seizure.  You have seizures more often.  There is any change in what happens during your seizures.  You keep having seizures with treatment.  You have symptoms of being sick or having an infection. Get help right away if:  You have a seizure that: ? Lasts longer than 5 minutes. ? Is different than seizures you had before. ? Makes it harder to breathe. ? Happens after you hurt your head.  You have any of these symptoms after a seizure: ? Not being able to speak. ? Not being able to use a part of your body. ? Confusion. ? A bad headache.  You have two or more seizures in a row.  You do not wake up right after a seizure.  You get hurt during a seizure. These symptoms may be an emergency. Do not wait to see if the symptoms will go away. Get medical help right away. Call your local emergency services (911 in the [place] Do not drive yourself to the hospital. Summary  Seizures usually last from 30 seconds to 2 minutes. Usually, they are not harmful unless they last a long time.  Do not eat or drink anything that may keep your medicine from working, such as alcohol.  Teach friends and family what to do when you have a seizure.  Contact your doctor each time you have a seizure. This information is not intended to replace advice given to you by your health care provider. Make sure you discuss any questions you have with your health care provider. Document Revised: 05/12/2018 Document Reviewed: 05/12/2018 Elsevier Patient Education  2020 [employer].

## 2019-08-16 NOTE — Progress Notes (Signed)
I reviewed note and agree with plan.   Cletis Clack R. Brunella Wileman, MD 08/16/2019, 4:29 PM Certified in Neurology, Neurophysiology and Neuroimaging  [facility] [address], [address] , [place] [address] [phone]  

## 2019-11-16 ENCOUNTER — Other Ambulatory Visit: Payer: Self-pay

## 2019-11-16 ENCOUNTER — Telehealth: Payer: Managed Care, Other (non HMO) | Admitting: Family Medicine

## 2019-11-16 ENCOUNTER — Other Ambulatory Visit (INDEPENDENT_AMBULATORY_CARE_PROVIDER_SITE_OTHER): Payer: Managed Care, Other (non HMO)

## 2019-11-16 ENCOUNTER — Encounter: Payer: Self-pay | Admitting: Family Medicine

## 2019-11-16 VITALS — BP 120/80 | Temp 97.0°F | Wt 162.0 lb

## 2019-11-16 DIAGNOSIS — J069 Acute upper respiratory infection, unspecified: Secondary | ICD-10-CM | POA: Diagnosis not present

## 2019-11-16 DIAGNOSIS — R0981 Nasal congestion: Secondary | ICD-10-CM

## 2019-11-16 DIAGNOSIS — J029 Acute pharyngitis, unspecified: Secondary | ICD-10-CM

## 2019-11-16 DIAGNOSIS — R0989 Other specified symptoms and signs involving the circulatory and respiratory systems: Secondary | ICD-10-CM | POA: Diagnosis not present

## 2019-11-16 LAB — POCT INFLUENZA A/B
Influenza A, POC: NEGATIVE
Influenza B, POC: NEGATIVE

## 2019-11-16 LAB — POC COVID19 BINAXNOW: SARS Coronavirus 2 Ag: NEGATIVE

## 2019-11-16 NOTE — Progress Notes (Signed)
   Subjective:    Patient ID: [name], female    DOB: 1960-10-01, 59 y.o.   MRN: [id number]  HPI I connected with  [name] on 11/16/19 by a video enabled telemedicine application and verified that I am speaking with the correct person using two identifiers.  Caregility is used.  She is at home and I am in my office. I discussed the limitations of evaluation and management by telemedicine. The patient expressed understanding and agreed to proceed. She states that yesterday she developed sore throat, rhinorrhea, myalgias and occasional cough.  She came here earlier today for Covid testing which was negative.  She has had previous vaccination for Covid.   Review of Systems     Objective:   Physical Exam Alert and in no distress.  Voice is slightly hoarse.       Assessment & Plan:  Viral URI with cough I explained that this is probably her basic viral upper respiratory infection and to treat the symptoms with Tylenol and cough suppressants.  Explained that it would take roughly 7 to 10 days to go away.  She was comfortable with that.  Approximately 5 minutes spent discussing this with her.

## 2019-11-18 LAB — SARS-COV-2, NAA 2 DAY TAT

## 2019-11-18 LAB — NOVEL CORONAVIRUS, NAA: SARS-CoV-2, NAA: NOT DETECTED

## 2019-11-23 ENCOUNTER — Other Ambulatory Visit: Payer: Self-pay | Admitting: Medical

## 2019-11-23 ENCOUNTER — Telehealth: Payer: Self-pay

## 2019-11-23 MED ORDER — PROMETHAZINE-DM 6.25-15 MG/5ML PO SYRP: 5.0000 mL | ORAL_SOLUTION | Freq: Four times a day (QID) | ORAL | 0 refills | Status: DC | PRN

## 2019-11-23 NOTE — Telephone Encounter (Signed)
Pt advised and said she didn't need a work note just yet. She starts her new job in a week and wanted to feel better before then.

## 2019-11-23 NOTE — Telephone Encounter (Signed)
Pt. Called back stating that she saw you last Friday and the OTC meds are not working she wanted to know if you could call her in something, she still has congestion, stopped up, and runny nose. She is supposed to be starting a new job on Monday and is afraid they won't let her work.

## 2019-11-23 NOTE — Telephone Encounter (Signed)
I called out Promethazine DM cough syrup.  She can try this.  We can write her a note saying that she is negative for Covid and may return to work

## 2019-11-23 NOTE — Telephone Encounter (Signed)
Pt called back stating still coughing and ear congested and not getting better and starting new job on Monday and just afraid that they are going to think she has COVID and send her home.  Would like something called in.  She has HTN & has used Coricidin HBP and also a whole bottle of Nyquil.

## 2019-11-23 NOTE — Telephone Encounter (Signed)
done

## 2019-11-30 ENCOUNTER — Telehealth: Payer: Self-pay

## 2019-11-30 NOTE — Telephone Encounter (Signed)
Pt. Called stating she saw you for a  virtual apt. On 11/16/19 she called back on 11/23/19 and [name] called her in some cough syrup medication, she is calling back to let you know she is still coughing up mucous a little yellow, and has a lot of congestion. She didn't know if you needed to increase her medication or call in something different now for her.

## 2019-12-01 NOTE — Telephone Encounter (Signed)
Set her up for an appt in the office

## 2019-12-04 NOTE — Telephone Encounter (Signed)
Pt stated she would give it a little more time and if she does not feel better by next week she will make an appt. KH

## 2020-02-08 ENCOUNTER — Other Ambulatory Visit: Payer: Self-pay | Admitting: Cardiovascular Disease

## 2020-03-08 ENCOUNTER — Other Ambulatory Visit: Payer: Self-pay | Admitting: Cardiovascular Disease

## 2020-03-10 ENCOUNTER — Other Ambulatory Visit: Payer: Self-pay | Admitting: Cardiovascular Disease

## 2020-03-11 ENCOUNTER — Other Ambulatory Visit: Payer: Self-pay | Admitting: Cardiovascular Disease

## 2020-03-17 ENCOUNTER — Other Ambulatory Visit: Payer: Self-pay | Admitting: Cardiovascular Disease

## 2020-04-07 ENCOUNTER — Other Ambulatory Visit: Payer: Self-pay | Admitting: Cardiovascular Disease

## 2020-04-08 ENCOUNTER — Other Ambulatory Visit: Payer: Self-pay | Admitting: *Deleted

## 2020-04-08 ENCOUNTER — Other Ambulatory Visit: Payer: Self-pay | Admitting: Cardiovascular Disease

## 2020-04-08 NOTE — Telephone Encounter (Signed)
Patient called in needing to get refills on medication and make appointment. Patient instructed to make sure she makes appointment to get 90 days refill

## 2020-04-14 ENCOUNTER — Other Ambulatory Visit: Payer: Self-pay | Admitting: Cardiovascular Disease

## 2020-04-16 ENCOUNTER — Other Ambulatory Visit: Payer: Self-pay | Admitting: Cardiovascular Disease

## 2020-05-28 ENCOUNTER — Encounter: Payer: Self-pay | Admitting: Physician Assistant

## 2020-05-28 ENCOUNTER — Ambulatory Visit (INDEPENDENT_AMBULATORY_CARE_PROVIDER_SITE_OTHER): Payer: BLUE CROSS/BLUE SHIELD | Admitting: Physician Assistant

## 2020-05-28 ENCOUNTER — Other Ambulatory Visit: Payer: Self-pay

## 2020-05-28 VITALS — BP 122/84 | HR 79 | Ht 64.0 in | Wt 159.4 lb

## 2020-05-28 DIAGNOSIS — I10 Essential (primary) hypertension: Secondary | ICD-10-CM | POA: Diagnosis not present

## 2020-05-28 DIAGNOSIS — I480 Paroxysmal atrial fibrillation: Secondary | ICD-10-CM

## 2020-05-28 MED ORDER — POTASSIUM CHLORIDE CRYS ER 20 MEQ PO TBCR: 20.0000 meq | EXTENDED_RELEASE_TABLET | Freq: Two times a day (BID) | ORAL | 3 refills | Status: DC

## 2020-05-28 MED ORDER — HYDROCHLOROTHIAZIDE 25 MG PO TABS: 25.0000 mg | ORAL_TABLET | Freq: Every day | ORAL | 3 refills | Status: DC

## 2020-05-28 MED ORDER — CARVEDILOL 12.5 MG PO TABS: 12.5000 mg | ORAL_TABLET | Freq: Two times a day (BID) | ORAL | 3 refills | Status: DC

## 2020-05-28 MED ORDER — LOSARTAN POTASSIUM 100 MG PO TABS: 100.0000 mg | ORAL_TABLET | Freq: Every day | ORAL | 3 refills | Status: DC

## 2020-05-28 MED ORDER — AMLODIPINE BESYLATE 5 MG PO TABS: ORAL_TABLET | ORAL | 3 refills | Status: DC

## 2020-05-28 NOTE — Patient Instructions (Signed)

## 2020-05-28 NOTE — Progress Notes (Signed)
Cardiology Office Note:    Date:  05/28/2020   ID:  [name], DOB 04/03/1960, MRN [id number]  PCP:  Avanell Shackleton, NP-C  [facility] Cardiologist:  Tonny Bollman, MD  [facility] Electrophysiologist:  None   Chief Complaint: Yearly follow up   History of Present Illness:    [name] is a 60 y.o. female with a hx of HTN, seizure disorder and PAF seen for follow up.   The patient has a remote history of atrial fibrillation with no evidence of this over recent years. She has never been anticoagulated.He complained palpitations when seen by Dr. Excell Seltzer 08/2017. EKG with sinus rhythm. Benign holder monitor. Echo showed normal LVEF and grade 2 DD.intermittent palpitations improved with reduced caffeine intake.   Patient is here for follow-up.  Works at [employer].  No regular exercise.  Denies chest pain, shortness of breath, orthopnea, PND, syncope, lower extremity edema or melena.  Past Medical History:  Diagnosis Date  . Atrial fibrillation (HCC)   . Back pain   . History of echocardiogram    Echo 5/17 - mild concentric LVH, vigorous LVEF, EF 65-70%, no RWMA, Gr 1 DD, normal RVEF, normal bilat atrial size  . Hypertension   . Seizures (HCC)    around 2014    Past Surgical History:  Procedure Laterality Date  . CESAREAN SECTION      Current Medications: Current Meds  Medication Sig  . ALPRAZolam (XANAX) 0.25 MG tablet Take 1 tablet (0.25 mg total) by mouth 2 (two) times daily as needed for anxiety.  Marland Kitchen aspirin EC 81 MG tablet Take 1 tablet (81 mg total) by mouth daily.  Marland Kitchen levETIRAcetam (KEPPRA) 500 MG tablet Take 1 tablet (500 mg total) by mouth 2 (two) times daily.  . Multiple Vitamins-Minerals (MULTIVITAMIN WITH MINERALS) tablet Take 1 tablet by mouth daily.  . promethazine-dextromethorphan (PROMETHAZINE-DM) 6.25-15 MG/5ML syrup Take 5 mLs by mouth 4 (four) times daily as needed for cough.  . sertraline (ZOLOFT) 50 MG tablet Take 1 tablet (50 mg total) by mouth  daily.  . [DISCONTINUED] amLODipine (NORVASC) 5 MG tablet TAKE 1 TABLET(5 MG) BY MOUTH DAILY  . [DISCONTINUED] carvedilol (COREG) 12.5 MG tablet Take 1 tablet (12.5 mg total) by mouth 2 (two) times daily with a meal. Please keep upcoming appt in March 2022 before anymore refills. Thank you  . [DISCONTINUED] hydrochlorothiazide (HYDRODIURIL) 25 MG tablet Take 1 tablet (25 mg total) by mouth daily. Please schedule appointment for future refills.  . [DISCONTINUED] losartan (COZAAR) 100 MG tablet Take 1 tablet (100 mg total) by mouth daily. Please keep upcoming appt in March 2022 before anymore refills. Thank you  . [DISCONTINUED] potassium chloride SA (KLOR-CON) 20 MEQ tablet Take 1 tablet (20 mEq total) by mouth 2 (two) times daily. Please make appt for future refills. 1st attempt     Allergies:   Penicillins   Social History   Socioeconomic History  . Marital status: Single    Spouse name: Minerva Areola  . Number of children: 2  . Years of education: College  . Highest education level: Not on file  Occupational History  . Occupation: health care billing specialist    Employer: OTHER    Comment: [employer]  Tobacco Use  . Smoking status: Former Smoker    Packs/day: 0.75    Years: 37.00    Pack years: 27.75    Types: Cigarettes    Quit date: 10/12/2016    Years since quitting: 3.6  . Smokeless tobacco:  Never Used  Vaping Use  . Vaping Use: Never used  Substance and Sexual Activity  . Alcohol use: Yes    Alcohol/week: 0.0 standard drinks    Comment: ocassionally on weekends   . Drug use: No  . Sexual activity: Yes    Partners: Male    Birth control/protection: Post-menopausal, Surgical    Comment: one partner since 2003  Other Topics Concern  . Not on file  Social History Narrative   Patient lives with her daughter.   Her son lives in [place], independently.   Caffeine Use: 2-3 cups daily Coffee   Social Determinants of Health   Financial Resource Strain: Not on file  Food  Insecurity: Not on file  Transportation Needs: Not on file  Physical Activity: Not on file  Stress: Not on file  Social Connections: Not on file     Family History: The patient's family history includes Heart attack (age of onset: 43) in her mother; Hypertension in her brother, brother, brother, mother, and son; Seizures in her sister; Sickle cell anemia in her mother; Sickle cell trait in her sister and sister.    ROS:   Please see the history of present illness.    All other systems reviewed and are negative.   EKGs/Labs/Other Studies Reviewed:    The following studies were reviewed today:  Echocardiogram:09/06/2017 - Left ventricle: The cavity size was normal. Wall thickness was normal. Systolic function was normal. The estimated ejection fraction was in the range of 60% to 65%. Wall motion was normal; there were no regional wall motion abnormalities. Features are consistent with a pseudonormal left ventricular filling pattern, with concomitant abnormal relaxation and increased filling pressure (grade 2 diastolic dysfunction).  48 holter monitor  The patient was monitored for 48 hours.  The predominant rhythm was sinus with an average rate of 81 bpm (range 57-119 bpm). The longest R-R interval was 1.1 seconds.  Rare PAC's and PVC's were observed. A single atrial run lasting 3 beats was noted.  There was no sustained arrhythmia or prolonged pause.  Predominantly sinus rhythm with rare PAC's and PVC's, as well as a single brief atrial run.  EKG:  EKG is  ordered today.  The ekg ordered today demonstrates NSR  Recent Labs: No results found for requested labs within last 8760 hours.  Recent Lipid Panel    Component Value Date/Time   CHOL 184 03/13/2019 1044   TRIG 96 03/13/2019 1044   HDL 76 03/13/2019 1044   CHOLHDL 2.4 03/13/2019 1044   LDLCALC 91 03/13/2019 1044     Risk Assessment/Calculations:    CHA2DS2-VASc Score = 2  {This indicates a 2.2%  annual risk of stroke. The patient's score is based upon: CHF History: No HTN History: Yes Diabetes History: No Stroke History: No Vascular Disease History: No Age Score: 0 Gender Score: 1       Physical Exam:    VS:  BP 122/84   Pulse 79   Ht 5\' 4"  (1.626 m)   Wt 159 lb 6.4 oz (72.3 kg)   SpO2 97%   BMI 27.36 kg/m     Wt Readings from Last 3 Encounters:  05/28/20 159 lb 6.4 oz (72.3 kg)  11/16/19 162 lb (73.5 kg)  08/15/19 162 lb (73.5 kg)     GEN: Well nourished, well developed in no acute distress HEENT: Normal NECK: No JVD; No carotid bruits LYMPHATICS: No lymphadenopathy CARDIAC: RRR, no murmurs, rubs, gallops RESPIRATORY:  Clear to auscultation without rales,  wheezing or rhonchi  ABDOMEN: Soft, non-tender, non-distended MUSCULOSKELETAL:  No edema; No deformity  SKIN: Warm and dry NEUROLOGIC:  Alert and oriented x 3 PSYCHIATRIC:  Normal affect   ASSESSMENT AND PLAN:   1. PAF - No recurrent palpations. Continue BB.   2. HTN - BP stable on current medications. No change.    Medication Adjustments/Labs and Tests Ordered: Current medicines are reviewed at length with the patient today.  Concerns regarding medicines are outlined above.  Orders Placed This Encounter  Procedures  . EKG 12-Lead   Meds ordered this encounter  Medications  . carvedilol (COREG) 12.5 MG tablet    Sig: Take 1 tablet (12.5 mg total) by mouth 2 (two) times daily with a meal.    Dispense:  180 tablet    Refill:  3  . hydrochlorothiazide (HYDRODIURIL) 25 MG tablet    Sig: Take 1 tablet (25 mg total) by mouth daily.    Dispense:  90 tablet    Refill:  3  . potassium chloride SA (KLOR-CON) 20 MEQ tablet    Sig: Take 1 tablet (20 mEq total) by mouth 2 (two) times daily.    Dispense:  180 tablet    Refill:  3  . losartan (COZAAR) 100 MG tablet    Sig: Take 1 tablet (100 mg total) by mouth daily.    Dispense:  90 tablet    Refill:  3    you  . amLODipine (NORVASC) 5 MG  tablet    Sig: TAKE 1 TABLET(5 MG) BY MOUTH DAILY    Dispense:  90 tablet    Refill:  3    Patient Instructions  Medication Instructions:  Your physician recommends that you continue on your current medications as directed. Please refer to the Current Medication list given to you today.  *If you need a refill on your cardiac medications before your next appointment, please call your pharmacy*   Lab Work: None ordered  If you have labs (blood work) drawn today and your tests are completely normal, you will receive your results only by: Marland Kitchen. MyChart Message (if you have MyChart) OR . A paper copy in the mail If you have any lab test that is abnormal or we need to change your treatment, we will call you to review the results.   Testing/Procedures: None ordered   Follow-Up: At [facility], you and your health needs are our priority.  As part of our continuing mission to provide you with exceptional heart care, we have created designated Provider Care Teams.  These Care Teams include your primary Cardiologist (physician) and Advanced Practice Providers (APPs -  Physician Assistants and Nurse Practitioners) who all work together to provide you with the care you need, when you need it.  We recommend signing up for the patient portal called "MyChart".  Sign up information is provided on this After Visit Summary.  MyChart is used to connect with patients for Virtual Visits (Telemedicine).  Patients are able to view lab/test results, encounter notes, upcoming appointments, etc.  Non-urgent messages can be sent to your provider as well.   To learn more about what you can do with MyChart, go to [email or web address].    Your next appointment:   12 month(s)  The format for your next appointment:   In Person  Provider:   You may see Tonny BollmanMichael Cooper, MD or one of the following Advanced Practice Providers on your designated Care Team:    Tereso NewcomerScott Weaver, PA-C  Vin Pumpkin HollowBhagat,  PA-C    Other  Instructions      Lorelei Pont, [place]  05/28/2020 3:40 PM    [facility]

## 2020-06-22 ENCOUNTER — Other Ambulatory Visit: Payer: Self-pay | Admitting: Cardiovascular Disease

## 2020-06-27 ENCOUNTER — Other Ambulatory Visit: Payer: Self-pay | Admitting: Cardiovascular Disease

## 2020-08-21 ENCOUNTER — Ambulatory Visit: Payer: BLUE CROSS/BLUE SHIELD | Admitting: Family Medicine

## 2020-09-23 ENCOUNTER — Encounter: Payer: Self-pay | Admitting: Family Medicine

## 2020-09-23 ENCOUNTER — Telehealth: Payer: BLUE CROSS/BLUE SHIELD | Admitting: Family Medicine

## 2020-09-23 DIAGNOSIS — G40909 Epilepsy, unspecified, not intractable, without status epilepticus: Secondary | ICD-10-CM

## 2020-09-23 MED ORDER — LEVETIRACETAM ER 500 MG PO TB24: 1000.0000 mg | ORAL_TABLET | Freq: Every day | ORAL | 3 refills | Status: DC

## 2020-09-23 NOTE — Patient Instructions (Addendum)
Below is our plan:  We will switch you to an extended release levetiracetam. Starting tomorrow, take levetiracetam ER 1000 mg (two 500mg  tablets) every morning. Let me know if cost is too expensive.   Please make sure you are staying well hydrated. I recommend 50-60 ounces daily. Well balanced diet and regular exercise encouraged. Consistent sleep schedule with 6-8 hours recommended.   Please continue follow up with care team as directed.   Follow up with me in 1 year   You may receive a survey regarding today's visit. I encourage you to leave honest feed back as I do use this information to improve patient care. Thank you for seeing me today!

## 2020-09-23 NOTE — Progress Notes (Signed)
   PATIENT: [name] DOB: 08/02/1960  REASON FOR VISIT: follow up HISTORY FROM: patient  Virtual Visit via Telephone Note  I connected with [name] on 09/23/20 at  7:30 AM EDT by telephone and verified that I am speaking with the correct person using two identifiers.   I discussed the limitations, risks, security and privacy concerns of performing an evaluation and management service by telephone and the availability of in person appointments. I also discussed with the patient that there may be a patient responsible charge related to this service. The patient expressed understanding and agreed to proceed.   History of Present Illness:  09/23/20 ALL: [name] is a 60 y.o. female here today for follow up for seizures. She continues levetiracetam 500mg  BID. She is tolerating medication well and denies recent seizure activity. She has a new job working nights and admits that she occasionally misses her evening dose of levetiracetam. She feels she would do better with once daily dosing. She continues regular follow up with PCP.   08/15/19 ALL:  [name] is a 60 y.o. female here today for follow up for seizures. She continues levetiracetam 500mg  twice daily. She denies seizure activity. She does note intermittent dizziness. BP has been up and down. She is followed by cardiology closely. She is under more stress at work. PCP advised sertraline and alprazolam. She has not started these medications.    Observations/Objective:  Generalized: Well developed, in no acute distress  Mentation: Alert oriented to time, place, history taking. Follows all commands speech and language fluent   Assessment and Plan:  60 y.o. year old female  has a past medical history of Atrial fibrillation (HCC), Back pain, History of echocardiogram, Hypertension, and Seizures (HCC). here with    ICD-10-CM   1. Seizure disorder (HCC)  G40.909       [name] is doing well, today. She has had missed  doses of levetiracetam due to fluctuating work schedule and feels she would do better with once daily dosing. We will switch her to levetiracetam ER 1000mg  daily. She is aware to take two 500mg  tablets every morning. Healthy lifestyle habits and seizure precautions reviewed. She will follow up regularly with PCP for annula CPE. She will return to see neurology in 1 year.   No orders of the defined types were placed in this encounter.   Meds ordered this encounter  Medications   levETIRAcetam (KEPPRA XR) 500 MG 24 hr tablet    Sig: Take 2 tablets (1,000 mg total) by mouth daily.    Dispense:  180 tablet    Refill:  3    Order Specific Question:   Supervising Provider    Answer:   66 [name]     Follow Up Instructions:  I discussed the assessment and treatment plan with the patient. The patient was provided an opportunity to ask questions and all were answered. The patient agreed with the plan and demonstrated an understanding of the instructions.   The patient was advised to call back or seek an in-person evaluation if the symptoms worsen or if the condition fails to improve as anticipated.  I provided 15 minutes of non-face-to-face time during this encounter. Patient located at their place of residence during Mychart visit. Provider is in the office.    , NP

## 2020-09-28 ENCOUNTER — Ambulatory Visit (HOSPITAL_COMMUNITY)
Admission: EM | Admit: 2020-09-28 | Discharge: 2020-09-28 | Disposition: A | Discharge status: Home / Self Care | Payer: BLUE CROSS/BLUE SHIELD | Attending: Internal Medicine | Admitting: Internal Medicine

## 2020-09-28 ENCOUNTER — Encounter (HOSPITAL_COMMUNITY): Payer: Self-pay | Admitting: Emergency Medicine

## 2020-09-28 ENCOUNTER — Other Ambulatory Visit: Payer: Self-pay

## 2020-09-28 DIAGNOSIS — J029 Acute pharyngitis, unspecified: Secondary | ICD-10-CM | POA: Diagnosis present

## 2020-09-28 DIAGNOSIS — J069 Acute upper respiratory infection, unspecified: Secondary | ICD-10-CM

## 2020-09-28 DIAGNOSIS — H9201 Otalgia, right ear: Secondary | ICD-10-CM | POA: Diagnosis present

## 2020-09-28 LAB — POCT RAPID STREP A, ED / UC: Streptococcus, Group A Screen (Direct): NEGATIVE

## 2020-09-28 MED ORDER — CEPACOL INSTAMAX 15-20 MG MT LOZG: 1.0000 | LOZENGE | Freq: Four times a day (QID) | OROMUCOSAL | 0 refills | Status: DC | PRN

## 2020-09-28 NOTE — Discharge Instructions (Addendum)
  Your rapid strep test was Negative today, however, a more detailed test known as a culture has been sent to the lab for more testing. This takes about 24-48 hours.  If the culture comes back Positive, we will let you know and call in an antibiotic for you.  If it is negative, your symptoms are likely from a virus and should resolve within 1 week.  If Follow up in 1 week with primary care if not improving, or follow up sooner in Urgent Care if needed.

## 2020-09-28 NOTE — ED Provider Notes (Signed)
[facility]    CSN: [id number] Arrival date & time: 09/28/20  1044      History   Chief Complaint Chief Complaint  Patient presents with   Sore Throat   Otalgia    HPI [name] is a 61 y.o. female.   HPI [name] is a 60 y.o. female presenting to UC with c/o 2 days gradually worsening sore throat and ear pain, primarily in Right ear.  Pain is 5/10.  Pain worse with swallowing but able to keep down fluids.  Denies fever, n/v/d. No known sick contacts or recent travel.  She has taken OTC pain medication with temporary relief.   Received 2 doses of Pfizer COVID vaccine in 2021.  Not concerned for COVID at this time, took home COVID test- Negative.    Past Medical History:  Diagnosis Date   Atrial fibrillation (HCC)    Back pain    History of echocardiogram    Echo 5/17 - mild concentric LVH, vigorous LVEF, EF 65-70%, no RWMA, Gr 1 DD, normal RVEF, normal bilat atrial size   Hypertension    Seizures (HCC)    around 2014    Patient Active Problem List   Diagnosis Date Noted   Seasonal allergic rhinitis due to pollen 06/22/2019   Seizure disorder (HCC) 05/22/2013   SICKLE CELL TRAIT 11/05/2008   SYNCOPE, HX OF 11/05/2008   PAF (paroxysmal atrial fibrillation) (HCC) 11/05/2008   Personal history of disease 11/05/2008   ANEMIA, OTHER, UNSPECIFIED 05/05/2006   Tobacco abuse 05/05/2006   TENSION HEADACHE 05/05/2006   Essential hypertension, benign 05/05/2006   GASTROESOPHAGEAL REFLUX, NO ESOPHAGITIS 05/05/2006    Past Surgical History:  Procedure Laterality Date   CESAREAN SECTION      OB History   No obstetric history on file.      Home Medications    Prior to Admission medications   Medication Sig Start Date End Date Taking? Authorizing Provider  Benzocaine-Menthol (CEPACOL INSTAMAX) 15-20 MG LOZG Use as directed 1 lozenge in the mouth or throat 4 (four) times daily as needed (sore throat). 09/28/20  Yes Valborg Friar, Vangie Bicker, PA-C  ALPRAZolam  Prudy Feeler) 0.25 MG tablet Take 1 tablet (0.25 mg total) by mouth 2 (two) times daily as needed for anxiety. 03/13/19   Henson, Vickie L, NP-C  amLODipine (NORVASC) 5 MG tablet TAKE 1 TABLET(5 MG) BY MOUTH DAILY 05/28/20   Manson Passey, PA  aspirin EC 81 MG tablet Take 1 tablet (81 mg total) by mouth daily. 09/01/17   Tonny Bollman, MD  carvedilol (COREG) 12.5 MG tablet TAKE 1 TABLET BY MOUTH TWICE DAILY WITH MEALS 06/24/20   Tonny Bollman, MD  hydrochlorothiazide (HYDRODIURIL) 25 MG tablet Take 1 tablet (25 mg total) by mouth daily. 05/28/20   Bhagat, Sharrell Ku, PA  levETIRAcetam (KEPPRA XR) 500 MG 24 hr tablet Take 2 tablets (1,000 mg total) by mouth daily. 09/23/20   Lomax, Amy, NP  levETIRAcetam (KEPPRA) 500 MG tablet Take 1 tablet (500 mg total) by mouth 2 (two) times daily. 08/15/19   Lomax, Amy, NP  losartan (COZAAR) 100 MG tablet TAKE 1 TABLET BY MOUTH DAILY 06/24/20   Tonny Bollman, MD  Multiple Vitamins-Minerals (MULTIVITAMIN WITH MINERALS) tablet Take 1 tablet by mouth daily.    [provider]  potassium chloride SA (KLOR-CON) 20 MEQ tablet Take 1 tablet (20 mEq total) by mouth 2 (two) times daily. 05/28/20   Manson Passey, PA  promethazine-dextromethorphan (PROMETHAZINE-DM) 6.25-15 MG/5ML syrup Take 5 mLs by  mouth 4 (four) times daily as needed for cough. 11/23/19   Tysinger, Kermit Balo, PA-C  sertraline (ZOLOFT) 50 MG tablet Take 1 tablet (50 mg total) by mouth daily. 03/13/19   Avanell Shackleton, NP-C    Family History Family History  Problem Relation Age of Onset   Sickle cell trait Sister    Hypertension Brother    Sickle cell anemia Mother    Hypertension Mother    Heart attack Mother 61   Seizures Sister    Sickle cell trait Sister    Hypertension Brother    Hypertension Brother    Hypertension Son     Social History Social History   Tobacco Use   Smoking status: Former    Packs/day: 0.75    Years: 37.00    Pack years: 27.75    Types: Cigarettes     Quit date: 10/12/2016    Years since quitting: 3.9   Smokeless tobacco: Never  Vaping Use   Vaping Use: Never used  Substance Use Topics   Alcohol use: Yes    Alcohol/week: 0.0 standard drinks    Comment: ocassionally on weekends    Drug use: No     Allergies   Penicillins   Review of Systems Review of Systems  Constitutional:  Negative for appetite change.  Musculoskeletal:  Negative for neck pain and neck stiffness.  Skin:  Negative for rash.  Neurological:  Negative for dizziness, syncope and headaches.  All other systems reviewed and are negative.   Physical Exam Triage Vital Signs ED Triage Vitals  Enc Vitals Group     BP 09/28/20 1201 (!) 143/85     Pulse Rate 09/28/20 1201 84     Resp 09/28/20 1201 16     Temp 09/28/20 1201 98.7 F (37.1 C)     Temp Source 09/28/20 1201 Oral     SpO2 09/28/20 1201 100 %     Weight --      Height --      Head Circumference --      Peak Flow --      Pain Score 09/28/20 1159 5     Pain Loc --      Pain Edu? --      Excl. in GC? --    No data found.  Updated Vital Signs BP (!) 143/85 (BP Location: Left Arm)   Pulse 84   Temp 98.7 F (37.1 C) (Oral)   Resp 16   SpO2 100%   Visual Acuity Right Eye Distance:   Left Eye Distance:   Bilateral Distance:    Right Eye Near:   Left Eye Near:    Bilateral Near:     Physical Exam Vitals and nursing note reviewed.  Constitutional:      General: She is not in acute distress.    Appearance: She is well-developed. She is not ill-appearing, toxic-appearing or diaphoretic.  HENT:     Head: Normocephalic and atraumatic.     Right Ear: Tympanic membrane and ear canal normal.     Left Ear: Tympanic membrane and ear canal normal.     Nose: Nose normal.     Right Sinus: No maxillary sinus tenderness or frontal sinus tenderness.     Left Sinus: No maxillary sinus tenderness or frontal sinus tenderness.     Mouth/Throat:     Lips: Pink.     Mouth: Mucous membranes are moist.      Pharynx: Oropharynx is clear. Uvula midline. Posterior oropharyngeal  erythema present. No pharyngeal swelling, oropharyngeal exudate or uvula swelling.     Tonsils: No tonsillar exudate or tonsillar abscesses.  Cardiovascular:     Rate and Rhythm: Normal rate and regular rhythm.  Pulmonary:     Effort: Pulmonary effort is normal. No respiratory distress.     Breath sounds: Normal breath sounds. No stridor. No wheezing, rhonchi or rales.  Musculoskeletal:        General: Normal range of motion.     Cervical back: Normal range of motion and neck supple.  Lymphadenopathy:     Cervical: No cervical adenopathy.  Skin:    General: Skin is warm and dry.     Findings: No rash.  Neurological:     Mental Status: She is alert and oriented to person, place, and time.  Psychiatric:        Behavior: Behavior normal.     UC Treatments / Results  Labs (all labs ordered are listed, but only abnormal results are displayed) Labs Reviewed  CULTURE, GROUP A STREP [facility])  POCT RAPID STREP A, ED / UC    EKG   Radiology No results found.  Procedures Procedures (including critical care time)  Medications Ordered in UC Medications - No data to display  Initial Impression / Assessment and Plan / UC Course  I have reviewed the triage vital signs and the nursing notes.  Pertinent labs & imaging results that were available during my care of the patient were reviewed by me and considered in my medical decision making (see chart for details).     Assessment and Plan  Acute pharyngitis, unspecified etiology Rapid strep: Negative, Culture pending Declined COVID PCR testing Acute otalgia 3. URI  Recommend symptomatic tx   F/u in 1 week if not improving, sooner if worsening.   Final Clinical Impressions(s) / UC Diagnoses   Final diagnoses:  Acute pharyngitis, unspecified etiology  Acute otalgia, right  Upper respiratory tract infection, unspecified type     Discharge Instructions        Your rapid strep test was Negative today, however, a more detailed test known as a culture has been sent to the lab for more testing. This takes about 24-48 hours.  If the culture comes back Positive, we will let you know and call in an antibiotic for you.  If it is negative, your symptoms are likely from a virus and should resolve within 1 week.  If Follow up in 1 week with primary care if not improving, or follow up sooner in Urgent Care if needed.      ED Prescriptions     Medication Sig Dispense Auth. Provider   Benzocaine-Menthol (CEPACOL INSTAMAX) 15-20 MG LOZG Use as directed 1 lozenge in the mouth or throat 4 (four) times daily as needed (sore throat). 16 lozenge Lurene Shadow, [place]      PDMP not reviewed this encounter.   Lurene Shadow, [place] 09/28/20 1404

## 2020-09-28 NOTE — ED Triage Notes (Signed)
Pt presents with ear pain and sore throat xs 2 days.  States has taken 2 at home COVID test with negative results.

## 2020-09-29 ENCOUNTER — Telehealth: Payer: BLUE CROSS/BLUE SHIELD | Admitting: Family Medicine

## 2020-09-29 ENCOUNTER — Encounter: Payer: Self-pay | Admitting: Family Medicine

## 2020-09-29 VITALS — BP 122/74 | HR 90 | Temp 99.6°F | Wt 147.6 lb

## 2020-09-29 DIAGNOSIS — R490 Dysphonia: Secondary | ICD-10-CM

## 2020-09-29 DIAGNOSIS — J029 Acute pharyngitis, unspecified: Secondary | ICD-10-CM

## 2020-09-29 LAB — POCT RAPID STREP A (OFFICE): Rapid Strep A Screen: NEGATIVE

## 2020-09-29 NOTE — Progress Notes (Signed)
   Subjective:    Patient ID: [name], female    DOB: 1960-03-16, 60 y.o.   MRN: [id number]  HPI She states that on Friday she developed hoarse voice, sore throat and subsequent earache, rhinorrhea, coughing with myalgias.  She tested for COVID on Saturday and was negative and again on Sunday. She did another COVID test before coming here and was negative. Review of Systems     Objective:   Physical Exam Alert and in no distress. Tympanic membranes and canals are normal. Pharyngeal area is normal. Neck is supple without adenopathy or thyromegaly. Cardiac exam shows a regular sinus rhythm without murmurs or gallops. Lungs are clear to auscultation. Strep screen is negative     Assessment & Plan:  Sore throat  Hoarse voice quality For your throat gargle with what ever works the best for you.  You can also take 2 Tylenol 4 times per day as needed for pain.  If that does not work, take 2 Aleve twice per day for the pain and if you need something else and then add the codeine I explained that we have ruled out COVID as well as strep and this is most likely a viral infection and will need to run its course.

## 2020-09-29 NOTE — Patient Instructions (Signed)
For your throat gargle with what ever works the best for you.  You can also take 2 Tylenol 4 times per day as needed for pain.  If that does not work, take 2 Aleve twice per day for the pain and if you need something else and then add the codeine

## 2020-09-30 ENCOUNTER — Telehealth: Payer: BLUE CROSS/BLUE SHIELD | Admitting: Medical

## 2020-09-30 LAB — CULTURE, GROUP A STREP (THRC)

## 2020-10-02 ENCOUNTER — Telehealth: Payer: Self-pay | Admitting: Family Medicine

## 2020-10-02 NOTE — Telephone Encounter (Signed)
Pt called and said she feels a little better but she is very congested and wanted to see what you could recommend. She also said she has not gotten her voice back completely and her job requires her to talk on the phone. She wanted to see if she could get a work note to be out today and tomorrow and return to work on Monday.

## 2020-10-02 NOTE — Telephone Encounter (Signed)
Called pt and informed of Dr. Jola Babinski note and suggested Coricidin HBP due to HTN and emailed doctor's note.

## 2021-04-09 ENCOUNTER — Other Ambulatory Visit: Payer: Self-pay | Admitting: Pain Medicine

## 2021-04-09 DIAGNOSIS — M25511 Pain in right shoulder: Secondary | ICD-10-CM

## 2021-04-09 DIAGNOSIS — G8929 Other chronic pain: Secondary | ICD-10-CM

## 2021-04-21 ENCOUNTER — Other Ambulatory Visit: Payer: Self-pay | Admitting: Family Medicine

## 2021-04-21 ENCOUNTER — Other Ambulatory Visit: Payer: Self-pay | Admitting: Physician Assistant

## 2021-04-21 DIAGNOSIS — Z1231 Encounter for screening mammogram for malignant neoplasm of breast: Secondary | ICD-10-CM

## 2021-05-13 ENCOUNTER — Ambulatory Visit: Payer: BLUE CROSS/BLUE SHIELD

## 2021-05-15 ENCOUNTER — Ambulatory Visit: Payer: BLUE CROSS/BLUE SHIELD

## 2021-05-22 ENCOUNTER — Ambulatory Visit: Payer: BLUE CROSS/BLUE SHIELD

## 2021-05-24 ENCOUNTER — Other Ambulatory Visit: Payer: Self-pay | Admitting: Physician Assistant

## 2021-05-27 ENCOUNTER — Other Ambulatory Visit: Payer: Self-pay | Admitting: Cardiovascular Disease

## 2021-06-11 ENCOUNTER — Encounter: Payer: Self-pay | Admitting: Cardiovascular Disease

## 2021-06-11 ENCOUNTER — Ambulatory Visit: Payer: BLUE CROSS/BLUE SHIELD | Admitting: Cardiovascular Disease

## 2021-06-11 VITALS — BP 132/76 | HR 84 | Ht 64.0 in | Wt 148.0 lb

## 2021-06-11 DIAGNOSIS — I480 Paroxysmal atrial fibrillation: Secondary | ICD-10-CM | POA: Diagnosis not present

## 2021-06-11 DIAGNOSIS — I10 Essential (primary) hypertension: Secondary | ICD-10-CM | POA: Diagnosis not present

## 2021-06-11 MED ORDER — METOPROLOL SUCCINATE ER 50 MG PO TB24: 50.0000 mg | ORAL_TABLET | Freq: Every day | ORAL | 3 refills | Status: DC

## 2021-06-11 NOTE — Progress Notes (Signed)
?Cardiology Office Note:   ? ?Date:  06/11/2021  ? ?ID:  [name], DOB Nov 26, 1960, MRN [id number] ? ?PCP:  Avanell Shackleton, NP-C ?  ?[facility] Providers ?Cardiologist:  Tonny Bollman, MD    ? ?Referring MD: Avanell Shackleton, NP-C  ? ?Chief Complaint  ?Patient presents with  ? Atrial Fibrillation  ? ? ?History of Present Illness:   ? ?[name] is a 61 y.o. female with a hx of paroxysmal atrial fibrillation, presenting for follow-up evaluation.  Comorbidities include seizure disorder and hypertension.  The patient was last seen in our office in March 2022.  Previous testing is demonstrated normal LVEF, grade 2 diastolic dysfunction, and a benign Holter monitor result.  Because the patient has had not had any documented atrial fibrillation in a long time and she has a low CHA2DS2-VASc score of 2, she has not been chronically anticoagulated especially in the context of her seizure disorder. ? ?The patient complains of generalized fatigue.  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  She denies chest pain, heart palpitations or shortness of breath.  She has had no seizures and has been well controlled on Keppra.  Overall she feels that she is doing well besides her fatigue.  She denies leg swelling, lightheadedness, orthopnea, or PND.  She is frequently forgetting to take her p.o. medications and asks about switching from carvedilol to a once daily beta-blocker. ? ?Past Medical History:  ?Diagnosis Date  ? Atrial fibrillation (HCC)   ? Back pain   ? History of echocardiogram   ? Echo 5/17 - mild concentric LVH, vigorous LVEF, EF 65-70%, no RWMA, Gr 1 DD, normal RVEF, normal bilat atrial size  ? Hypertension   ? Seizures (HCC)   ? around 2014  ? ? ?Past Surgical History:  ?Procedure Laterality Date  ? CESAREAN SECTION    ? ? ?Current Medications: ?Current Meds  ?Medication Sig  ? ALPRAZolam (XANAX) 0.25 MG tablet Take 1 tablet (0.25 mg total) by mouth 2 (two) times daily as needed for  anxiety.  ? amLODipine (NORVASC) 5 MG tablet TAKE 1 TABLET(5 MG) BY MOUTH DAILY  ? aspirin EC 81 MG tablet Take 1 tablet (81 mg total) by mouth daily.  ? Benzocaine-Menthol (CEPACOL INSTAMAX) 15-20 MG LOZG Use as directed 1 lozenge in the mouth or throat 4 (four) times daily as needed (sore throat).  ? hydrochlorothiazide (HYDRODIURIL) 25 MG tablet Take 1 tablet (25 mg total) by mouth daily.  ? levETIRAcetam (KEPPRA XR) 500 MG 24 hr tablet Take 2 tablets (1,000 mg total) by mouth daily.  ? levETIRAcetam (KEPPRA) 500 MG tablet Take 1 tablet (500 mg total) by mouth 2 (two) times daily.  ? losartan (COZAAR) 100 MG tablet Take 1 tablet (100 mg total) by mouth daily.  ? metoprolol succinate (TOPROL-XL) 50 MG 24 hr tablet Take 1 tablet (50 mg total) by mouth daily. Take with or immediately following a meal.  ? Multiple Vitamins-Minerals (MULTIVITAMIN WITH MINERALS) tablet Take 1 tablet by mouth daily.  ? potassium chloride SA (KLOR-CON) 20 MEQ tablet Take 1 tablet (20 mEq total) by mouth 2 (two) times daily.  ? promethazine-dextromethorphan (PROMETHAZINE-DM) 6.25-15 MG/5ML syrup Take 5 mLs by mouth 4 (four) times daily as needed for cough.  ? [DISCONTINUED] carvedilol (COREG) 12.5 MG tablet TAKE 1 TABLET(12.5 MG) BY MOUTH TWICE DAILY WITH A MEAL  ?  ? ?Allergies:   Penicillins  ? ?Social History  ? ?Socioeconomic History  ?  Marital status: Single  ?  Spouse name: Minerva Areola  ? Number of children: 2  ? Years of education: College  ? Highest education level: Not on file  ?Occupational History  ? Occupation: health care billing specialist  ?  Employer: OTHER  ?  Comment: [employer]  ?Tobacco Use  ? Smoking status: Former  ?  Packs/day: 0.75  ?  Years: 37.00  ?  Pack years: 27.75  ?  Types: Cigarettes  ?  Quit date: 10/12/2016  ?  Years since quitting: 4.6  ? Smokeless tobacco: Never  ?Vaping Use  ? Vaping Use: Never used  ?Substance and Sexual Activity  ? Alcohol use: Yes  ?  Alcohol/week: 0.0 standard drinks  ?  Comment: ocassionally  on weekends   ? Drug use: No  ? Sexual activity: Yes  ?  Partners: Male  ?  Birth control/protection: Post-menopausal, Surgical  ?  Comment: one partner since 2003  ?Other Topics Concern  ? Not on file  ?Social History Narrative  ? Patient lives with her daughter.  ? Her son lives in [place], independently.  ? Caffeine Use: 2-3 cups daily Coffee  ? ?Social Determinants of Health  ? ?Financial Resource Strain: Not on file  ?Food Insecurity: Not on file  ?Transportation Needs: Not on file  ?Physical Activity: Not on file  ?Stress: Not on file  ?Social Connections: Not on file  ?  ? ?Family History: ?The patient's family history includes Heart attack (age of onset: 78) in her mother; Hypertension in her brother, brother, brother, mother, and son; Seizures in her sister; Sickle cell anemia in her mother; Sickle cell trait in her sister and sister. ? ?ROS:   ?Please see the history of present illness.    ?All other systems reviewed and are negative. ? ?EKGs/Labs/Other Studies Reviewed:   ? ?The following studies were reviewed today: ?Echo 2019: ?- Left ventricle: The cavity size was normal. Wall thickness was  ?  normal. Systolic function was normal. The estimated ejection  ?  fraction was in the range of 60% to 65%. Wall motion was normal;  ?  there were no regional wall motion abnormalities. Features are  ?  consistent with a pseudonormal left ventricular filling pattern,  ?  with concomitant abnormal relaxation and increased filling  ?  pressure (grade 2 diastolic dysfunction).  ? ?EKG:  EKG is ordered today.  The ekg ordered today demonstrates normal sinus rhythm 84 bpm, within normal limits. ? ?Recent Labs: ?No results found for requested labs within last 8760 hours.  ?Recent Lipid Panel ?   ?Component Value Date/Time  ? CHOL 184 03/13/2019 1044  ? TRIG 96 03/13/2019 1044  ? HDL 76 03/13/2019 1044  ? CHOLHDL 2.4 03/13/2019 1044  ? LDLCALC 91 03/13/2019 1044  ? ? ? ?Risk Assessment/Calculations:   ? ?CHA2DS2-VASc  Score = 2  ? This indicates a 2.2% annual risk of stroke. ?The patient's score is based upon: ?CHF History: 0 ?HTN History: 1 ?Diabetes History: 0 ?Stroke History: 0 ?Vascular Disease History: 0 ?Age Score: 0 ?Gender Score: 1 ?  ?  ? ?    ? ?Physical Exam:   ? ?VS:  BP 132/76   Pulse 84   Ht 5\' 4"  (1.626 m)   Wt 148 lb (67.1 kg)   SpO2 99%   BMI 25.40 kg/m?    ? ?Wt Readings from Last 3 Encounters:  ?06/11/21 148 lb (67.1 kg)  ?09/29/20 147 lb 9.6 oz (67 kg)  ?  05/28/20 159 lb 6.4 oz (72.3 kg)  ?  ? ?GEN:  Well nourished, well developed in no acute distress ?HEENT: Normal ?NECK: No JVD; No carotid bruits ?LYMPHATICS: No lymphadenopathy ?CARDIAC: RRR, 2/6 outflow murmur at the right upper sternal border ?RESPIRATORY:  Clear to auscultation without rales, wheezing or rhonchi  ?ABDOMEN: Soft, non-tender, non-distended ?MUSCULOSKELETAL:  No edema; No deformity  ?SKIN: Warm and dry ?NEUROLOGIC:  Alert and oriented x 3 ?PSYCHIATRIC:  Normal affect  ? ?ASSESSMENT:   ? ?1. PAF (paroxysmal atrial fibrillation) (HCC)   ?2. Essential hypertension   ? ?PLAN:   ? ?In order of problems listed above: ? ?No recurrence in many years.  The patient has not been anticoagulated.  She continues on aspirin for antiplatelet therapy.  Remains asymptomatic and doing well in sinus rhythm. ?Blood pressure controlled.  She is forgetting her p.m. dose of carvedilol on a frequent basis.  Will change carvedilol 12.5 mg twice daily to metoprolol succinate 50 mg daily.  Otherwise no medication changes today.  We will update labs to include CBC, full metabolic panel, and TSH. ? ?   ?Medication Adjustments/Labs and Tests Ordered: ?Current medicines are reviewed at length with the patient today.  Concerns regarding medicines are outlined above.  ?Orders Placed This Encounter  ?Procedures  ? CBC  ? Comprehensive metabolic panel  ? TSH  ? EKG 12-Lead  ? ?Meds ordered this encounter  ?Medications  ? metoprolol succinate (TOPROL-XL) 50 MG 24 hr tablet   ?  Sig: Take 1 tablet (50 mg total) by mouth daily. Take with or immediately following a meal.  ?  Dispense:  90 tablet  ?  Refill:  3  ?  Replaces Carvedilol  ? ? ?Patient Instructions  ?Medication Instruction

## 2021-06-11 NOTE — Patient Instructions (Addendum)
Medication Instructions:  ?STOP Carvedilol ?START Metoprolol Succinate (Toprol XL) daily ?Your physician recommends that you continue on your current medications as directed. Please refer to the Current Medication list given to you today. ? ?*If you need a refill on your cardiac medications before your next appointment, please call your pharmacy* ? ? ?Lab Work: ?CBC, CMET, TSH today ?If you have labs (blood work) drawn today and your tests are completely normal, you will receive your results only by: ?MyChart Message (if you have MyChart) OR ?A paper copy in the mail ?If you have any lab test that is abnormal or we need to change your treatment, we will call you to review the results. ? ? ?Testing/Procedures: ?NONE ? ? ?Follow-Up: ?At [facility], you and your health needs are our priority.  As part of our continuing mission to provide you with exceptional heart care, we have created designated Provider Care Teams.  These Care Teams include your primary Cardiologist (physician) and Advanced Practice Providers (APPs -  Physician Assistants and Nurse Practitioners) who all work together to provide you with the care you need, when you need it. ? ?Your next appointment:   ?1 year(s) ? ?The format for your next appointment:   ?In Person ? ?Provider:   ?Tonny Bollman, MD   ? ? ?  ?

## 2021-06-12 LAB — COMPREHENSIVE METABOLIC PANEL
ALT: 13 IU/L (ref 0–32)
AST: 15 IU/L (ref 0–40)
Albumin/Globulin Ratio: 1.7 (ref 1.2–2.2)
Albumin: 4.1 g/dL (ref 3.8–4.8)
Alkaline Phosphatase: 65 IU/L (ref 44–121)
BUN/Creatinine Ratio: 15 (ref 12–28)
BUN: 15 mg/dL (ref 8–27)
Bilirubin Total: 0.5 mg/dL (ref 0.0–1.2)
CO2: 24 mmol/L (ref 20–29)
Calcium: 9.1 mg/dL (ref 8.7–10.3)
Chloride: 104 mmol/L (ref 96–106)
Creatinine, Ser: 1.01 mg/dL — ABNORMAL HIGH (ref 0.57–1.00)
Globulin, Total: 2.4 g/dL (ref 1.5–4.5)
Glucose: 86 mg/dL (ref 70–99)
Potassium: 3.8 mmol/L (ref 3.5–5.2)
Sodium: 143 mmol/L (ref 134–144)
Total Protein: 6.5 g/dL (ref 6.0–8.5)
eGFR: 63 mL/min/{1.73_m2} (ref >59)

## 2021-06-12 LAB — CBC
Hematocrit: 38.5 % (ref 34.0–46.6)
Hemoglobin: 12.8 g/dL (ref 11.1–15.9)
MCH: 28.6 pg (ref 26.6–33.0)
MCHC: 33.2 g/dL (ref 31.5–35.7)
MCV: 86 fL (ref 79–97)
Platelets: 281 10*3/uL (ref 150–450)
RBC: 4.47 x10E6/uL (ref 3.77–5.28)
RDW: 12.4 % (ref 11.7–15.4)
WBC: 8.3 10*3/uL (ref 3.4–10.8)

## 2021-06-12 LAB — TSH: TSH: 0.444 u[IU]/mL — ABNORMAL LOW (ref 0.450–4.500)

## 2021-06-18 NOTE — Progress Notes (Signed)
? ?Established Patient Office Visit ? ?Subjective:  ?Patient ID: [name], female    DOB: 05-Dec-1960  Age: 61 y.o. MRN: [id number] ? ?CC:  ?Chief Complaint  ?Patient presents with  ? Follow-up  ?  Follow up from recent cardiologist visit. Wants to see what she can do for low energy.  ? ? ?HPI ?[name] presents to follow up labs done by her Cardiologist; all labs were in range except for TSH mildly abnormal; her Cardiologist recommended that she follow-up with her primary physician to see if further testing such as fT4 indicated; states she works 5, 12 hour shifts a week and has some fatigue. ? ? ?Outpatient Medications Prior to Visit  ?Medication Sig Dispense Refill  ? amLODipine (NORVASC) 5 MG tablet TAKE 1 TABLET(5 MG) BY MOUTH DAILY 90 tablet 3  ? aspirin EC 81 MG tablet Take 1 tablet (81 mg total) by mouth daily. 90 tablet 3  ? hydrochlorothiazide (HYDRODIURIL) 25 MG tablet TAKE 1 TABLET(25 MG) BY MOUTH DAILY 90 tablet 3  ? levETIRAcetam (KEPPRA XR) 500 MG 24 hr tablet Take 2 tablets (1,000 mg total) by mouth daily. 180 tablet 3  ? losartan (COZAAR) 100 MG tablet Take 1 tablet (100 mg total) by mouth daily. 30 tablet 0  ? levETIRAcetam (KEPPRA) 500 MG tablet Take 1 tablet (500 mg total) by mouth 2 (two) times daily. 180 tablet 3  ? ALPRAZolam (XANAX) 0.25 MG tablet Take 1 tablet (0.25 mg total) by mouth 2 (two) times daily as needed for anxiety. (Patient not taking: Reported on 06/19/2021) 12 tablet 0  ? Benzocaine-Menthol (CEPACOL INSTAMAX) 15-20 MG LOZG Use as directed 1 lozenge in the mouth or throat 4 (four) times daily as needed (sore throat). (Patient not taking: Reported on 06/19/2021) 16 lozenge 0  ? metoprolol succinate (TOPROL-XL) 50 MG 24 hr tablet Take 1 tablet (50 mg total) by mouth daily. Take with or immediately following a meal. (Patient not taking: Reported on 06/19/2021) 90 tablet 3  ? Multiple Vitamins-Minerals (MULTIVITAMIN WITH MINERALS) tablet Take 1 tablet by mouth daily.  (Patient not taking: Reported on 06/19/2021)    ? potassium chloride SA (KLOR-CON) 20 MEQ tablet Take 1 tablet (20 mEq total) by mouth 2 (two) times daily. (Patient not taking: Reported on 06/19/2021) 180 tablet 3  ? promethazine-dextromethorphan (PROMETHAZINE-DM) 6.25-15 MG/5ML syrup Take 5 mLs by mouth 4 (four) times daily as needed for cough. (Patient not taking: Reported on 06/19/2021) 120 mL 0  ? ?No facility-administered medications prior to visit.  ? ? ?Allergies  ?Allergen Reactions  ? Penicillins Hives and Rash  ?  Has patient had a PCN reaction causing immediate rash, facial/tongue/throat swelling, SOB or lightheadedness with hypotension: yes ?Has patient had a PCN reaction causing severe rash involving mucus membranes or skin necrosis: No ?Has patient had a PCN reaction that required hospitalization No ?Has patient had a PCN reaction occurring within the last 10 years: No ?If all of the above answers are "NO", then may proceed with Cephalosporin use. ?  ? ? ?ROS ?Review of Systems  ?Constitutional:  Positive for fatigue. Negative for activity change and chills.  ?HENT:  Negative for congestion and voice change.   ?Eyes:  Negative for pain and redness.  ?Respiratory:  Negative for cough and wheezing.   ?Cardiovascular:  Negative for chest pain.  ?Gastrointestinal:  Negative for constipation, diarrhea, nausea and vomiting.  ?Endocrine: Negative for polyuria.  ?Genitourinary:  Negative for frequency.  ?Skin:  Negative for  color change and rash.  ?Allergic/Immunologic: Negative for immunocompromised state.  ?Neurological:  Negative for dizziness.  ?Psychiatric/Behavioral:  Negative for agitation.   ? ?  ?Objective:  ?  ?Physical Exam ?Vitals and nursing note reviewed.  ?Constitutional:   ?   General: She is not in acute distress. ?   Appearance: She is normal weight. She is not ill-appearing.  ?HENT:  ?   Head: Normocephalic and atraumatic.  ?   Right Ear: External ear normal.  ?   Left Ear: External ear normal.   ?Eyes:  ?   Extraocular Movements: Extraocular movements intact.  ?   Conjunctiva/sclera: Conjunctivae normal.  ?   Pupils: Pupils are equal, round, and reactive to light.  ?Cardiovascular:  ?   Rate and Rhythm: Normal rate and regular rhythm.  ?Pulmonary:  ?   Effort: Pulmonary effort is normal.  ?   Breath sounds: Normal breath sounds. No wheezing.  ?Abdominal:  ?   General: Bowel sounds are normal.  ?   Palpations: Abdomen is soft.  ?Musculoskeletal:     ?   General: Normal range of motion.  ?   Cervical back: Normal range of motion.  ?Skin: ?   General: Skin is warm and dry.  ?Neurological:  ?   Mental Status: She is alert and oriented to person, place, and time.  ?Psychiatric:     ?   Mood and Affect: Mood normal.  ? ? ?BP 110/70   Pulse 78   Ht 5\' 4"  (1.626 m)   Wt 146 lb 3.2 oz (66.3 kg)   SpO2 96%   BMI 25.10 kg/m?  ? ?BP Readings from Last 5 Encounters:  ?06/19/21 110/70  ?06/11/21 132/76  ?09/29/20 122/74  ?09/28/20 (!) 143/85  ?05/28/20 122/84  ? ? ?Wt Readings from Last 3 Encounters:  ?06/19/21 146 lb 3.2 oz (66.3 kg)  ?06/11/21 148 lb (67.1 kg)  ?09/29/20 147 lb 9.6 oz (67 kg)  ? ? ?Results for orders placed or performed in visit on 06/19/21  ?Thyroid Panel With TSH  ?Result Value Ref Range  ? TSH 0.622 0.450 - 4.500 uIU/mL  ? T4, Total 6.7 4.5 - 12.0 ug/dL  ? T3 Uptake Ratio 25 24 - 39 %  ? Free Thyroxine Index 1.7 1.2 - 4.9  ?  ? ?  ? ?The 10-year ASCVD risk score (Arnett DK, et al., 2019) is: 8.9% ? ?  ?Assessment & Plan:  ? ?Problem List Items Addressed This Visit   ? ?  ? Cardiovascular and Mediastinum  ? Essential hypertension, benign  ?  ? Other  ? Abnormal TSH - Primary  ? Relevant Orders  ? Thyroid Panel With TSH (Completed)  ? Body mass index (BMI) of 25.0 to 25.9 in adult  ? ?Other Visit Diagnoses   ? ? Malaise and fatigue      ? Relevant Orders  ? Thyroid Panel With TSH (Completed)  ? ?  ? ? ?No orders of the defined types were placed in this encounter. ? ? ?Follow-up: Return in about  3 months (around 09/18/2021) for Return for Annual Exam with PCP 09/20/2021.  ? ? ?Mayford Knife, PA-C ?

## 2021-06-19 ENCOUNTER — Ambulatory Visit
Admission: RE | Admit: 2021-06-19 | Discharge: 2021-06-19 | Disposition: A | Discharge status: Home / Self Care | Payer: BLUE CROSS/BLUE SHIELD | Source: Ambulatory Visit | Attending: Physician Assistant | Admitting: Physician Assistant

## 2021-06-19 ENCOUNTER — Encounter: Payer: Self-pay | Admitting: Physician Assistant

## 2021-06-19 ENCOUNTER — Ambulatory Visit (INDEPENDENT_AMBULATORY_CARE_PROVIDER_SITE_OTHER): Payer: BLUE CROSS/BLUE SHIELD | Admitting: Physician Assistant

## 2021-06-19 ENCOUNTER — Other Ambulatory Visit: Payer: Self-pay | Admitting: Physician Assistant

## 2021-06-19 VITALS — BP 110/70 | HR 78 | Ht 64.0 in | Wt 146.2 lb

## 2021-06-19 DIAGNOSIS — R5381 Other malaise: Secondary | ICD-10-CM

## 2021-06-19 DIAGNOSIS — R7989 Other specified abnormal findings of blood chemistry: Secondary | ICD-10-CM

## 2021-06-19 DIAGNOSIS — Z6825 Body mass index (BMI) 25.0-25.9, adult: Secondary | ICD-10-CM

## 2021-06-19 DIAGNOSIS — I10 Essential (primary) hypertension: Secondary | ICD-10-CM | POA: Diagnosis not present

## 2021-06-19 DIAGNOSIS — R5383 Other fatigue: Secondary | ICD-10-CM

## 2021-06-19 DIAGNOSIS — Z1231 Encounter for screening mammogram for malignant neoplasm of breast: Secondary | ICD-10-CM

## 2021-06-19 NOTE — Patient Instructions (Signed)
You can take an OTC multivitamin and a vitamin b complex to help with low energy ?

## 2021-06-20 LAB — THYROID PANEL WITH TSH
Free Thyroxine Index: 1.7 (ref 1.2–4.9)
T3 Uptake Ratio: 25 % (ref 24–39)
T4, Total: 6.7 ug/dL (ref 4.5–12.0)
TSH: 0.622 u[IU]/mL (ref 0.450–4.500)

## 2021-06-22 ENCOUNTER — Encounter: Payer: Self-pay | Admitting: Physician Assistant

## 2021-06-22 DIAGNOSIS — Z6825 Body mass index (BMI) 25.0-25.9, adult: Secondary | ICD-10-CM

## 2021-06-22 DIAGNOSIS — R7989 Other specified abnormal findings of blood chemistry: Secondary | ICD-10-CM | POA: Insufficient documentation

## 2021-06-22 HISTORY — DX: Body mass index (BMI) 25.0-25.9, adult: Z68.25

## 2021-07-02 ENCOUNTER — Other Ambulatory Visit: Payer: Self-pay | Admitting: Cardiovascular Disease

## 2021-07-24 ENCOUNTER — Other Ambulatory Visit: Payer: Self-pay | Admitting: Cardiovascular Disease

## 2021-07-27 NOTE — Telephone Encounter (Signed)
Outpatient Medication Detail   Disp Refills Start End   losartan (COZAAR) 100 MG tablet 90 tablet 3 07/02/2021    Sig: TAKE 1 TABLET(100 MG) BY MOUTH DAILY   Sent to pharmacy as: losartan (COZAAR) 100 MG tablet   E-Prescribing Status: Receipt confirmed by pharmacy (07/02/2021  3:41 PM EDT)    Pharmacy  [id number] - [place], [place] - [address] OF [address] & [facility]

## 2021-09-03 ENCOUNTER — Telehealth: Payer: Self-pay | Admitting: Cardiovascular Disease

## 2021-09-03 NOTE — Telephone Encounter (Signed)
Patient called and mentioned that she needs some paperwork filled out because they only let them use the restroom 3 times a days but since she's on the medication that make her use the bathroom, she needs documentation telling them she can go more than 3 times

## 2021-09-03 NOTE — Telephone Encounter (Signed)
Sent letter to patient via MyChart per request. Called and left message for patient to let her know.

## 2021-09-10 ENCOUNTER — Telehealth: Payer: Self-pay | Admitting: Cardiovascular Disease

## 2021-09-10 NOTE — Telephone Encounter (Signed)
Patient has paperwork that needs to be filled out for her job.  The letter that was written for her her job will not accept it.  She is planning to drop it off the forms either today or tomorrow.  She is leaving a note with the paperwork.  She would like a call when the paperwork is completed so she can pick them up.

## 2021-09-14 ENCOUNTER — Encounter: Payer: BLUE CROSS/BLUE SHIELD | Admitting: Physician Assistant

## 2021-09-15 ENCOUNTER — Encounter: Payer: Self-pay | Admitting: Cardiovascular Disease

## 2021-09-15 NOTE — Telephone Encounter (Signed)
Pt is calling requesting call back in regards to these forms for work. She states that she has been waiting for a call to let her know they are ready to be picked up. Please advise.

## 2021-09-16 ENCOUNTER — Telehealth: Payer: Self-pay | Admitting: Cardiovascular Disease

## 2021-09-16 DIAGNOSIS — Z0279 Encounter for issue of other medical certificate: Secondary | ICD-10-CM

## 2021-09-16 NOTE — Telephone Encounter (Signed)
Form and payment received. Form placed in Dr. Earmon Phoenix box.

## 2021-09-16 NOTE — Telephone Encounter (Signed)
Call patient regarding her medication that she is taking.  She wants to make sure that the Meloxicam 15mg  tabs are safe for her to take given her cardiac condition.

## 2021-09-17 NOTE — Telephone Encounter (Signed)
Supple, Megan E, RPH-CPP 4 hours ago (10:42 AM)    Pt also sent in MyChart message asking about this, PharmD response as follows: Ok to use if needed, she doesn't have a history of CAD. Does take low dose aspirin so should be on the lookout for any bruising/bleeding out of the ordinary, and should keep an eye on her blood pressure as chronic use of meloxicam can raise her blood pressure.      Left above information on voicemail for patient.

## 2021-09-17 NOTE — Telephone Encounter (Signed)
Thx. I'll sign when I'm back from vacation next week.

## 2021-09-17 NOTE — Telephone Encounter (Signed)
Forms received. Left message for patient letting her know that Dr Excell Seltzer will not be back in office until Monday the 15th to sign the forms.

## 2021-09-17 NOTE — Telephone Encounter (Signed)
Follow Up:     Patient is calling back to see what was decided about her taking the new medicine?

## 2021-09-17 NOTE — Telephone Encounter (Signed)
Pt also sent in MyChart message asking about this, PharmD response as follows: Ok to use if needed, she doesn't have a history of CAD. Does take low dose aspirin so should be on the lookout for any bruising/bleeding out of the ordinary, and should keep an eye on her blood pressure as chronic use of meloxicam can raise her blood pressure.

## 2021-09-21 ENCOUNTER — Other Ambulatory Visit: Payer: Self-pay

## 2021-09-21 MED ORDER — LEVETIRACETAM ER 500 MG PO TB24: 1000.0000 mg | ORAL_TABLET | Freq: Every day | ORAL | 3 refills | Status: DC

## 2021-09-21 NOTE — Telephone Encounter (Signed)
Forms completed. Faxed to [facility] [employer] [phone] - Attn: [phone].  Patient also asked that we email a copy to her at : [email or web address]. This done as well  on 09/21/21.  Forms placed to be scanned by 09/23/21.

## 2021-11-11 ENCOUNTER — Encounter: Payer: Self-pay | Admitting: Internal Medicine

## 2021-12-15 ENCOUNTER — Encounter: Payer: Self-pay | Admitting: Internal Medicine

## 2022-01-19 ENCOUNTER — Encounter: Payer: Self-pay | Admitting: Internal Medicine

## 2022-04-27 ENCOUNTER — Telehealth: Payer: Self-pay | Admitting: Cardiovascular Disease

## 2022-04-27 NOTE — Telephone Encounter (Signed)
Patient c/o Palpitations:  High priority if patient c/o lightheadedness, shortness of breath, or chest pain  How long have you had palpitations/irregular HR/ Afib? Are you having the symptoms now? Has been going on for about a month progressively worsening, is not having symptoms now  Are you currently experiencing lightheadedness, SOB or CP? No   Do you have a history of afib (atrial fibrillation) or irregular heart rhythm? Yes   Have you checked your BP or HR? (document readings if available): No   Are you experiencing any other symptoms? Burping and extremely tired    Patient is requesting an appt with Burt Knack regarding this. She states she believes it may be her job causing these symptoms due to working 10'12 hour days. She is requesting an appt for earlier morning due to having to be at work by 10:15. Please advise.

## 2022-04-28 NOTE — Telephone Encounter (Signed)
Patient states she is returning call. Please advise

## 2022-04-29 NOTE — Telephone Encounter (Signed)
Returned call to patient, no answer. Left voicemail asking that she call us back. The earliest I can accommodate on Dr Antionette Char schedule is 10am (couple of different date options) until July, or she can see Kathlen Mody, [place] and we can arrange an AM appt time. Awaiting call back.

## 2022-04-30 NOTE — Telephone Encounter (Signed)
Returned call to patient to get scheduled, left another message for her to call back when she gets the chance.

## 2022-04-30 NOTE — Telephone Encounter (Signed)
Patient called back in, scheduled for 1:20pm on 06/21/22. No further questions.

## 2022-04-30 NOTE — Telephone Encounter (Signed)
Pt is returning Sarah's call from yesterday. Pt doesn't want to be scheduled with a PA, and is insisting on seeing Dr. Burt Knack and states that Judson Roch told her she could see Dr. Burt Knack sooner than July.

## 2022-05-26 ENCOUNTER — Other Ambulatory Visit: Payer: Self-pay | Admitting: Cardiovascular Disease

## 2022-06-18 ENCOUNTER — Ambulatory Visit: Payer: BLUE CROSS/BLUE SHIELD | Admitting: Cardiovascular Disease

## 2022-06-21 ENCOUNTER — Ambulatory Visit: Payer: BLUE CROSS/BLUE SHIELD | Attending: Cardiovascular Disease | Admitting: Cardiovascular Disease

## 2022-06-21 ENCOUNTER — Encounter: Payer: Self-pay | Admitting: Cardiovascular Disease

## 2022-06-21 ENCOUNTER — Ambulatory Visit (INDEPENDENT_AMBULATORY_CARE_PROVIDER_SITE_OTHER): Payer: BLUE CROSS/BLUE SHIELD

## 2022-06-21 VITALS — BP 110/62 | HR 78 | Ht 64.5 in | Wt 159.6 lb

## 2022-06-21 DIAGNOSIS — I10 Essential (primary) hypertension: Secondary | ICD-10-CM

## 2022-06-21 DIAGNOSIS — I480 Paroxysmal atrial fibrillation: Secondary | ICD-10-CM

## 2022-06-21 NOTE — Progress Notes (Unsigned)
Enrolled for [employer] to mail a ZIO XT long term holter monitor to the patients address on file.  

## 2022-06-21 NOTE — Patient Instructions (Signed)
Medication Instructions:  Your physician recommends that you continue on your current medications as directed. Please refer to the Current Medication list given to you today.  *If you need a refill on your cardiac medications before your next appointment, please call your pharmacy*   Lab Work: CBC, CMET, TSH today If you have labs (blood work) drawn today and your tests are completely normal, you will receive your results only by: MyChart Message (if you have MyChart) OR A paper copy in the mail If you have any lab test that is abnormal or we need to change your treatment, we will call you to review the results.  Testing/Procedures: ECHO Your physician has requested that you have an echocardiogram. Echocardiography is a painless test that uses sound waves to create images of your heart. It provides your doctor with information about the size and shape of your heart and how well your heart's chambers and valves are working. This procedure takes approximately one hour. There are no restrictions for this procedure. Please do NOT wear cologne, perfume, aftershave, or lotions (deodorant is allowed). Please arrive 15 minutes prior to your appointment time.  Zio monitor x14 days Your physician has recommended that you wear an event monitor. Event monitors are medical devices that record the heart's electrical activity. Doctors most often [place] these monitors to diagnose arrhythmias. Arrhythmias are problems with the speed or rhythm of the heartbeat. The monitor is a small, portable device. You can wear one while you do your normal daily activities. This is usually used to diagnose what is causing palpitations/syncope (passing out).  Follow-Up: At [facility], you and your health needs are our priority.  As part of our continuing mission to provide you with exceptional heart care, we have created designated Provider Care Teams.  These Care Teams include your primary Cardiologist (physician) and  Advanced Practice Providers (APPs -  Physician Assistants and Nurse Practitioners) who all work together to provide you with the care you need, when you need it.  Your next appointment:   1 year(s)  Provider:   Tonny Bollman, MD      Other Instructions [name]- Long Term Monitor Instructions  Your physician has requested you wear a ZIO patch monitor for 14 days.  This is a single patch monitor. [employer] supplies one patch monitor per enrollment. Additional stickers are not available. Please do not apply patch if you will be having a Nuclear Stress Test,  Echocardiogram, Cardiac CT, MRI, or Chest Xray during the period you would be wearing the  monitor. The patch cannot be worn during these tests. You cannot remove and re-apply the  ZIO XT patch monitor.  Your ZIO patch monitor will be mailed 3 day USPS to your address on file. It may take 3-5 days  to receive your monitor after you have been enrolled.  Once you have received your monitor, please review the enclosed instructions. Your monitor  has already been registered assigning a specific monitor serial # to you.  Billing and Patient Assistance Program Information  We have supplied [employer] with any of your insurance information on file for billing purposes. [employer] offers a sliding scale Patient Assistance Program for patients that do not have  insurance, or whose insurance does not completely cover the cost of the ZIO monitor.  You must apply for the Patient Assistance Program to qualify for this discounted rate.  To apply, please call [employer] at [phone], select option 4, select option 2, ask to apply for  Patient Assistance Program. [name] will ask your household income, and how many people  are in your household. They will quote your out-of-pocket cost based on that information.  [employer] will also be able to set up a 54-month, interest-free payment plan if needed.  Applying the monitor   Shave hair from upper left chest.   Hold abrader disc by orange tab. Rub abrader in 40 strokes over the upper left chest as  indicated in your monitor instructions.  Clean area with 4 enclosed alcohol pads. Let dry.  Apply patch as indicated in monitor instructions. Patch will be placed under collarbone on left  side of chest with arrow pointing upward.  Rub patch adhesive wings for 2 minutes. Remove white label marked "1". Remove the white  label marked "2". Rub patch adhesive wings for 2 additional minutes.  While looking in a mirror, press and release button in center of patch. A small green light will  flash 3-4 times. This will be your only indicator that the monitor has been turned on.  Do not shower for the first 24 hours. You may shower after the first 24 hours.  Press the button if you feel a symptom. You will hear a small click. Record Date, Time and  Symptom in the Patient Logbook.  When you are ready to remove the patch, follow instructions on the last 2 pages of Patient  Logbook. Stick patch monitor onto the last page of Patient Logbook.  Place Patient Logbook in the blue and white box. Use locking tab on box and tape box closed  securely. The blue and white box has prepaid postage on it. Please place it in the mailbox as  soon as possible. Your physician should have your test results approximately 7 days after the  monitor has been mailed back to [facility].  Call [facility] Customer Care at [phone] if you have questions regarding  your ZIO XT patch monitor. Call them immediately if you see an orange light blinking on your  monitor.  If your monitor falls off in less than 4 days, contact our Monitor department at [phone].  If your monitor becomes loose or falls off after 4 days call [employer] at [phone] for  suggestions on securing your monitor

## 2022-06-21 NOTE — Progress Notes (Signed)
Cardiology Office Note:    Date:  06/21/2022   ID:  [name], DOB 1960-04-09, MRN [id number]  PCP:  Lexine Baton (Inactive)   [facility] Providers Cardiologist:  Tonny Bollman, MD     Referring MD: No ref. provider found   Chief Complaint  Patient presents with   Atrial Fibrillation    History of Present Illness:    [name] is a 62 y.o. female with a hx of paroxysmal atrial fibrillation, presenting for follow-up evaluation. Comorbidities include seizure disorder and hypertension.  She has not demonstrated any evidence of atrial fibrillation in several years, and with her seizure disorder she has not been anticoagulated.  Last year I changed her from carvedilol to metoprolol succinate to get her on a once daily beta-blocker due to her forgetting her second medication dose frequently.  Her labs were updated and were essentially within normal limits.  The patient is here alone today.  She has been having more heart palpitations.  She has been under a lot of stress at work and feels that this has made her have more heart palpitations.  She is feeling a lot of heart racing and fluttering.  She denies chest pain, chest pressure, or shortness of breath.  She has also been under more stress at home as her grown son is living with her now.  She has not had ankle swelling, orthopnea, or PND.  She has had no lightheadedness or syncope.  She still smoking cigarettes.  She is drinking about 2-3 beers per day.  Past Medical History:  Diagnosis Date   Atrial fibrillation    Back pain    History of echocardiogram    Echo 5/17 - mild concentric LVH, vigorous LVEF, EF 65-70%, no RWMA, Gr 1 DD, normal RVEF, normal bilat atrial size   Hypertension    Seizures    around 2014    Past Surgical History:  Procedure Laterality Date   CESAREAN SECTION      Current Medications: Current Meds  Medication Sig   amLODipine (NORVASC) 5 MG tablet TAKE 1 TABLET(5 MG) BY  MOUTH DAILY   hydrochlorothiazide (HYDRODIURIL) 25 MG tablet TAKE 1 TABLET(25 MG) BY MOUTH DAILY   HYDROcodone-acetaminophen (NORCO/VICODIN) 5-325 MG tablet as needed.   levETIRAcetam (KEPPRA XR) 500 MG 24 hr tablet Take 2 tablets (1,000 mg total) by mouth daily.   losartan (COZAAR) 100 MG tablet TAKE 1 TABLET(100 MG) BY MOUTH DAILY   metoprolol succinate (TOPROL-XL) 50 MG 24 hr tablet Take 50 mg by mouth daily.   [DISCONTINUED] aspirin EC 81 MG tablet Take 1 tablet (81 mg total) by mouth daily.     Allergies:   Penicillins   Social History   Socioeconomic History   Marital status: Single    Spouse name: Minerva Areola   Number of children: 2   Years of education: College   Highest education level: Not on file  Occupational History   Occupation: health care billing specialist    Employer: OTHER    Comment: [employer]  Tobacco Use   Smoking status: Former    Packs/day: 0.75    Years: 37.00    Additional pack years: 0.00    Total pack years: 27.75    Types: Cigarettes    Quit date: 10/12/2016    Years since quitting: 5.6   Smokeless tobacco: Never  Vaping Use   Vaping Use: Never used  Substance and Sexual Activity   Alcohol use: Yes  Alcohol/week: 0.0 standard drinks of alcohol    Comment: ocassionally on weekends    Drug use: No   Sexual activity: Yes    Partners: Male    Birth control/protection: Post-menopausal, Surgical    Comment: one partner since 2003  Other Topics Concern   Not on file  Social History Narrative   Patient lives with her daughter.   Her son lives in [place], independently.   Caffeine Use: 2-3 cups daily Coffee   Social Determinants of Health   Financial Resource Strain: Not on file  Food Insecurity: Not on file  Transportation Needs: Not on file  Physical Activity: Not on file  Stress: Not on file  Social Connections: Not on file     Family History: The patient's family history includes Heart attack (age of onset: 72) in her mother;  Hypertension in her brother, brother, brother, mother, and son; Seizures in her sister; Sickle cell anemia in her mother; Sickle cell trait in her sister and sister. There is no history of Breast cancer.  ROS:   Please see the history of present illness.    All other systems reviewed and are negative.  EKGs/Labs/Other Studies Reviewed:    The following studies were reviewed today: Cardiac Studies & Procedures       ECHOCARDIOGRAM  ECHOCARDIOGRAM COMPLETE 09/06/2017  Narrative *[facility]* [address] [place], [place] [address] [phone]  ------------------------------------------------------------------- Transthoracic Echocardiography  Patient:    [name], [name] MR #:       [id number] Study Date: 09/06/2017 Gender:     F Age:        73 Height:     162.6 cm Weight:     73 kg BSA:        1.84 m^2 Pt. Status: Room:  ATTENDING    Tonny Bollman, MD ORDERING     Tonny Bollman, MD REFERRING    Tonny Bollman, MD PERFORMING   [facility], Outpatient SONOGRAPHER  [facility], RDCS  cc:  ------------------------------------------------------------------- LV EF: 60% -   65%  ------------------------------------------------------------------- Indications:      Palpitations (R00.2).  ------------------------------------------------------------------- History:   PMH:   Dyspnea.  Atrial fibrillation.  Risk factors: Family history of coronary artery disease. Former tobacco use. Hypertension.  ------------------------------------------------------------------- Study Conclusions  - Left ventricle: The cavity size was normal. Wall thickness was normal. Systolic function was normal. The estimated ejection fraction was in the range of 60% to 65%. Wall motion was normal; there were no regional wall motion abnormalities. Features are consistent with a pseudonormal left ventricular filling pattern, with concomitant abnormal relaxation and increased  filling pressure (grade 2 diastolic dysfunction).  ------------------------------------------------------------------- Study data:  Comparison was made to the study of 07/11/2015.  Study status:  Routine.  Procedure:  Transthoracic echocardiography. Image quality was adequate.          Transthoracic echocardiography.  M-mode, complete 2D, spectral Doppler, and color Doppler.  Birthdate:  Patient birthdate: [birth year].  Age:  Patient is 62 yr old.  Sex:  Gender: female.    BMI: 27.6 kg/m^2.  Blood pressure:     124/74  Patient status:  Outpatient.  Study date: Study date: 09/06/2017. Study time: 03:49 PM.  Location:  [facility]  -------------------------------------------------------------------  ------------------------------------------------------------------- Left ventricle:  The cavity size was normal. Wall thickness was normal. Systolic function was normal. The estimated ejection fraction was in the range of 60% to 65%. Wall motion was normal; there were no regional wall motion abnormalities. Features are  consistent with a pseudonormal left ventricular filling pattern, with concomitant abnormal relaxation and increased filling pressure (grade 2 diastolic dysfunction).  ------------------------------------------------------------------- Aortic valve:   Mildly thickened leaflets. Cusp separation was normal.  Doppler:  Transvalvular velocity was within the normal range. There was no stenosis. There was no regurgitation.  ------------------------------------------------------------------- Aorta:  Aortic root: The aortic root was normal in size. Ascending aorta: The ascending aorta was normal in size.  ------------------------------------------------------------------- Mitral valve:   Structurally normal valve.   Leaflet separation was normal.  Doppler:  Transvalvular velocity was within the normal range. There was no evidence for stenosis. There was  no regurgitation.  ------------------------------------------------------------------- Left atrium:  The atrium was normal in size.  ------------------------------------------------------------------- Right ventricle:  The cavity size was normal. Systolic function was normal.  ------------------------------------------------------------------- Pulmonic valve:    The valve appears to be grossly normal. Doppler:  There was no significant regurgitation.  ------------------------------------------------------------------- Tricuspid valve:   The valve appears to be grossly normal. Doppler:  There was trivial regurgitation.  ------------------------------------------------------------------- Pulmonary artery:   Systolic pressure was within the normal range.  ------------------------------------------------------------------- Right atrium:  The atrium was normal in size.  ------------------------------------------------------------------- Pericardium:  There was no pericardial effusion.  ------------------------------------------------------------------- Systemic veins: Inferior vena cava: The vessel was normal in size. The respirophasic diameter changes were in the normal range (>= 50%), consistent with normal central venous pressure.  ------------------------------------------------------------------- Measurements  Left ventricle                         Value        Reference LV ID, ED, PLAX chordal                49.3  mm     43 - 52 LV ID, ES, PLAX chordal                27.7  mm     23 - 38 LV fx shortening, PLAX chordal         44    %      >=29 LV PW thickness, ED                    10.1  mm     ---------- IVS/LV PW ratio, ED                    0.84         <=1.3 Stroke volume, 2D                      66    ml     ---------- Stroke volume/bsa, 2D                  36    ml/m^2 ---------- LV e&', lateral                         8.05  cm/s   ---------- LV E/e&', lateral                        8.14         ---------- LV e&', medial                          7.18  cm/s   ---------- LV E/e&', medial  9.12         ---------- LV e&', average                         7.62  cm/s   ---------- LV E/e&', average                       8.6          ----------  Ventricular septum                     Value        Reference IVS thickness, ED                      8.52  mm     ----------  LVOT                                   Value        Reference LVOT ID, S                             18    mm     ---------- LVOT area                              2.54  cm^2   ---------- LVOT peak velocity, S                  124   cm/s   ---------- LVOT mean velocity, S                  68.7  cm/s   ---------- LVOT VTI, S                            26    cm     ---------- LVOT peak gradient, S                  6     mm Hg  ----------  Aorta                                  Value        Reference Aortic root ID, ED                     33    mm     ---------- Ascending aorta ID, A-P, S             34    mm     ----------  Left atrium                            Value        Reference LA ID, A-P, ES                         37    mm     ---------- LA ID/bsa, A-P  2.02  cm/m^2 <=2.2 LA volume, S                           52.9  ml     ---------- LA volume/bsa, S                       28.8  ml/m^2 ---------- LA volume, ES, 1-p A4C                 52.7  ml     ---------- LA volume/bsa, ES, 1-p A4C             28.7  ml/m^2 ---------- LA volume, ES, 1-p A2C                 49.4  ml     ---------- LA volume/bsa, ES, 1-p A2C             26.9  ml/m^2 ----------  Mitral valve                           Value        Reference Mitral E-wave peak velocity            65.5  cm/s   ---------- Mitral A-wave peak velocity            74.7  cm/s   ---------- Mitral deceleration time               204   ms     150 - 230 Mitral E/A ratio, peak                 0.9           ----------  Pulmonary arteries                     Value        Reference PA pressure, S, DP                     25    mm Hg  <=30  Tricuspid valve                        Value        Reference Tricuspid regurg peak velocity         236   cm/s   ---------- Tricuspid peak RV-RA gradient          22    mm Hg  ----------  Right atrium                           Value        Reference RA ID, S-I, ES, A4C            (H)     49.3  mm     34 - 49 RA area, ES, A4C                       15.9  cm^2   8.3 - 19.5 RA volume, ES, A/L                     43.7  ml     ---------- RA volume/bsa, ES, A/L  23.8  ml/m^2 ----------  Systemic veins                         Value        Reference Estimated CVP                          3     mm Hg  ----------  Right ventricle                        Value        Reference TAPSE                                  27.2  mm     ---------- RV pressure, S, DP                     25    mm Hg  <=30 RV s&', lateral, S                      10.7  cm/s   ----------  Legend: (L)  and  (H)  mark values outside specified reference range.  ------------------------------------------------------------------- Prepared and Electronically Authenticated by  Kristeen Miss, M.D. 2019-07-02T17:58:41              EKG:  EKG is ordered today.  The ekg ordered today demonstrates normal sinus rhythm 78 bpm, minimal voltage criteria for LVH may be normal variant  Recent Labs: No results found for requested labs within last 365 days.  Recent Lipid Panel    Component Value Date/Time   CHOL 184 03/13/2019 1044   TRIG 96 03/13/2019 1044   HDL 76 03/13/2019 1044   CHOLHDL 2.4 03/13/2019 1044   LDLCALC 91 03/13/2019 1044     Risk Assessment/Calculations:    CHA2DS2-VASc Score = 2   This indicates a 2.2% annual risk of stroke. The patient's score is based upon: CHF History: 0 HTN History: 1 Diabetes History: 0 Stroke History: 0 Vascular Disease History:  0 Age Score: 0 Gender Score: 1                Physical Exam:    VS:  BP 110/62   Pulse 78   Ht 5' 4.5" (1.638 m)   Wt 159 lb 9.6 oz (72.4 kg)   SpO2 97%   BMI 26.97 kg/m     Wt Readings from Last 3 Encounters:  06/21/22 159 lb 9.6 oz (72.4 kg)  06/19/21 146 lb 3.2 oz (66.3 kg)  06/11/21 148 lb (67.1 kg)     GEN:  Well nourished, well developed in no acute distress HEENT: Normal NECK: No JVD; No carotid bruits LYMPHATICS: No lymphadenopathy CARDIAC: RRR, 2/6 systolic ejection murmur at the right upper sternal border RESPIRATORY:  Clear to auscultation without rales, wheezing or rhonchi  ABDOMEN: Soft, non-tender, non-distended MUSCULOSKELETAL:  No edema; No deformity  SKIN: Warm and dry NEUROLOGIC:  Alert and oriented x 3 PSYCHIATRIC:  Normal affect   ASSESSMENT:    1. PAF (paroxysmal atrial fibrillation)   2. Essential hypertension    PLAN:    In order of problems listed above:  The patient is having recurrent symptoms suggestive of atrial fibrillation.  She is in sinus rhythm today.  She relates a lot of this to  job and home stress.  I advised her that we should update some testing and check labs to include a CBC, metabolic panel, and TSH.  We will check an echo to evaluate LV function, presence of any valvular disease, and atrial size.  We will check a 2 ZIO monitor to better define her arrhythmia and evaluate her heart palpitations.  She is advised to reduce alcohol and caffeine intake. Blood pressure is very well-controlled on a combination of amlodipine, hydrochlorothiazide, losartan, and metoprolol succinate.     Medication Adjustments/Labs and Tests Ordered: Current medicines are reviewed at length with the patient today.  Concerns regarding medicines are outlined above.  Orders Placed This Encounter  Procedures   CBC   Comprehensive metabolic panel   TSH   LONG TERM MONITOR (3-14 DAYS)   EKG 12-Lead   ECHOCARDIOGRAM COMPLETE   No orders of the  defined types were placed in this encounter.   Patient Instructions  Medication Instructions:  Your physician recommends that you continue on your current medications as directed. Please refer to the Current Medication list given to you today.  *If you need a refill on your cardiac medications before your next appointment, please call your pharmacy*   Lab Work: CBC, CMET, TSH today If you have labs (blood work) drawn today and your tests are completely normal, you will receive your results only by: MyChart Message (if you have MyChart) OR A paper copy in the mail If you have any lab test that is abnormal or we need to change your treatment, we will call you to review the results.  Testing/Procedures: ECHO Your physician has requested that you have an echocardiogram. Echocardiography is a painless test that uses sound waves to create images of your heart. It provides your doctor with information about the size and shape of your heart and how well your heart's chambers and valves are working. This procedure takes approximately one hour. There are no restrictions for this procedure. Please do NOT wear cologne, perfume, aftershave, or lotions (deodorant is allowed). Please arrive 15 minutes prior to your appointment time.  Zio monitor x14 days Your physician has recommended that you wear an event monitor. Event monitors are medical devices that record the heart's electrical activity. Doctors most often [place] these monitors to diagnose arrhythmias. Arrhythmias are problems with the speed or rhythm of the heartbeat. The monitor is a small, portable device. You can wear one while you do your normal daily activities. This is usually used to diagnose what is causing palpitations/syncope (passing out).  Follow-Up: At [facility], you and your health needs are our priority.  As part of our continuing mission to provide you with exceptional heart care, we have created designated Provider Care  Teams.  These Care Teams include your primary Cardiologist (physician) and Advanced Practice Providers (APPs -  Physician Assistants and Nurse Practitioners) who all work together to provide you with the care you need, when you need it.  Your next appointment:   1 year(s)  Provider:   Tonny Bollman, MD      Other Instructions Christena Deem- Long Term Monitor Instructions  Your physician has requested you wear a ZIO patch monitor for 14 days.  This is a single patch monitor. [employer] supplies one patch monitor per enrollment. Additional stickers are not available. Please do not apply patch if you will be having a Nuclear Stress Test,  Echocardiogram, Cardiac CT, MRI, or Chest Xray during the period you would be wearing the  monitor. The patch cannot be worn during these tests. You cannot remove and re-apply the  ZIO XT patch monitor.  Your ZIO patch monitor will be mailed 3 day USPS to your address on file. It may take 3-5 days  to receive your monitor after you have been enrolled.  Once you have received your monitor, please review the enclosed instructions. Your monitor  has already been registered assigning a specific monitor serial # to you.  Billing and Patient Assistance Program Information  We have supplied [employer] with any of your insurance information on file for billing purposes. [employer] offers a sliding scale Patient Assistance Program for patients that do not have  insurance, or whose insurance does not completely cover the cost of the ZIO monitor.  You must apply for the Patient Assistance Program to qualify for this discounted rate.  To apply, please call [employer] at [phone], select option 4, select option 2, ask to apply for  Patient Assistance Program. Meredeth Ide will ask your household income, and how many people  are in your household. They will quote your out-of-pocket cost based on that information.  [employer] will also be able to set up a 1-month, interest-free payment  plan if needed.  Applying the monitor   Shave hair from upper left chest.  Hold abrader disc by orange tab. Rub abrader in 40 strokes over the upper left chest as  indicated in your monitor instructions.  Clean area with 4 enclosed alcohol pads. Let dry.  Apply patch as indicated in monitor instructions. Patch will be placed under collarbone on left  side of chest with arrow pointing upward.  Rub patch adhesive wings for 2 minutes. Remove white label marked "1". Remove the white  label marked "2". Rub patch adhesive wings for 2 additional minutes.  While looking in a mirror, press and release button in center of patch. A small green light will  flash 3-4 times. This will be your only indicator that the monitor has been turned on.  Do not shower for the first 24 hours. You may shower after the first 24 hours.  Press the button if you feel a symptom. You will hear a small click. Record Date, Time and  Symptom in the Patient Logbook.  When you are ready to remove the patch, follow instructions on the last 2 pages of Patient  Logbook. Stick patch monitor onto the last page of Patient Logbook.  Place Patient Logbook in the blue and white box. Use locking tab on box and tape box closed  securely. The blue and white box has prepaid postage on it. Please place it in the mailbox as  soon as possible. Your physician should have your test results approximately 7 days after the  monitor has been mailed back to [facility].  Call [facility] Customer Care at [phone] if you have questions regarding  your ZIO XT patch monitor. Call them immediately if you see an orange light blinking on your  monitor.  If your monitor falls off in less than 4 days, contact our Monitor department at [phone].  If your monitor becomes loose or falls off after 4 days call [employer] at [phone] for  suggestions on securing your monitor    Signed, Tonny Bollman, MD  06/21/2022 5:19 PM    [facility]

## 2022-06-22 ENCOUNTER — Ambulatory Visit (HOSPITAL_COMMUNITY): Payer: BLUE CROSS/BLUE SHIELD | Attending: Internal Medicine

## 2022-06-22 DIAGNOSIS — I480 Paroxysmal atrial fibrillation: Secondary | ICD-10-CM | POA: Diagnosis present

## 2022-06-22 DIAGNOSIS — I10 Essential (primary) hypertension: Secondary | ICD-10-CM

## 2022-06-22 LAB — COMPREHENSIVE METABOLIC PANEL
ALT: 17 IU/L (ref 0–32)
AST: 17 IU/L (ref 0–40)
Albumin/Globulin Ratio: 1.8 (ref 1.2–2.2)
Albumin: 4.4 g/dL (ref 3.9–4.9)
Alkaline Phosphatase: 77 IU/L (ref 44–121)
BUN/Creatinine Ratio: 15 (ref 12–28)
BUN: 14 mg/dL (ref 8–27)
Bilirubin Total: 0.4 mg/dL (ref 0.0–1.2)
CO2: 24 mmol/L (ref 20–29)
Calcium: 9.6 mg/dL (ref 8.7–10.3)
Chloride: 102 mmol/L (ref 96–106)
Creatinine, Ser: 0.93 mg/dL (ref 0.57–1.00)
Globulin, Total: 2.4 g/dL (ref 1.5–4.5)
Glucose: 123 mg/dL — ABNORMAL HIGH (ref 70–99)
Potassium: 3.5 mmol/L (ref 3.5–5.2)
Sodium: 140 mmol/L (ref 134–144)
Total Protein: 6.8 g/dL (ref 6.0–8.5)
eGFR: 69 mL/min/{1.73_m2} (ref >59)

## 2022-06-22 LAB — CBC
Hematocrit: 39.2 % (ref 34.0–46.6)
Hemoglobin: 12.5 g/dL (ref 11.1–15.9)
MCH: 27.5 pg (ref 26.6–33.0)
MCHC: 31.9 g/dL (ref 31.5–35.7)
MCV: 86 fL (ref 79–97)
Platelets: 252 10*3/uL (ref 150–450)
RBC: 4.54 x10E6/uL (ref 3.77–5.28)
RDW: 12.1 % (ref 11.7–15.4)
WBC: 8.1 10*3/uL (ref 3.4–10.8)

## 2022-06-22 LAB — ECHOCARDIOGRAM COMPLETE
Area-P 1/2: 3.97 cm2
P 1/2 time: 426 msec
S' Lateral: 2.8 cm

## 2022-06-22 LAB — TSH: TSH: 0.7 u[IU]/mL (ref 0.450–4.500)

## 2022-06-23 DIAGNOSIS — I480 Paroxysmal atrial fibrillation: Secondary | ICD-10-CM | POA: Diagnosis not present

## 2022-06-23 DIAGNOSIS — I10 Essential (primary) hypertension: Secondary | ICD-10-CM

## 2022-06-29 ENCOUNTER — Other Ambulatory Visit: Payer: Self-pay | Admitting: Cardiovascular Disease

## 2022-07-24 ENCOUNTER — Other Ambulatory Visit: Payer: Self-pay | Admitting: Cardiovascular Disease

## 2022-09-09 ENCOUNTER — Other Ambulatory Visit: Payer: Self-pay | Admitting: Cardiovascular Disease

## 2022-09-21 ENCOUNTER — Other Ambulatory Visit: Payer: Self-pay

## 2022-09-29 ENCOUNTER — Telehealth: Payer: Self-pay | Admitting: Family Medicine

## 2022-09-29 MED ORDER — LEVETIRACETAM ER 500 MG PO TB24: 1000.0000 mg | ORAL_TABLET | Freq: Every day | ORAL | 0 refills | Status: DC

## 2022-09-29 NOTE — Telephone Encounter (Signed)
  Dispenses    Dispensed Days Supply Quantity Provider Pharmacy  LEVETIRACETAM ER 500MG  TABLETS 08/25/2022 30 60 each Lomax, Amy, NP [facility] #...  LEVETIRACETAM ER 500MG  TABLETS 07/28/2022 30 60 each Lomax, Amy, NP [facility] #...  LEVETIRACETAM ER 500MG  TABLETS 06/21/2022 30 60 each Lomax, Amy, NP [facility] #...  LEVETIRACETAM ER 500MG  TABLETS 05/22/2022 30 60 each Lomax, Amy, NP [facility] #...  LEVETIRACETAM ER 500MG  TABLETS 04/23/2022 30 60 each Lomax, Amy, NP [facility] #...  LEVETIRACETAM ER 500MG  TABLETS 03/23/2022 30 60 each Lomax, Amy, NP [facility] #...  LEVETIRACETAM ER 500MG  TABLETS 02/23/2022 30 60 each Lomax, Amy, NP [facility] #...  LEVETIRACETAM ER 500MG  TABLETS 01/25/2022 30 60 each Lomax, Amy, NP [facility] #...  LEVETIRACETAM ER 500MG  TABLETS 12/23/2021 30 60 each Lomax, Amy, NP [facility] #...  LEVETIRACETAM ER 500MG  TABLETS 11/19/2021 30 60 each Lomax, Amy, NP [facility] #...  LEVETIRACETAM ER 500MG  TABLETS 10/20/2021 30 60 each Lomax, Amy, NP [facility] #.Marland KitchenMarland Kitchen

## 2022-09-29 NOTE — Telephone Encounter (Signed)
Pt is requesting a refill for levETIRAcetam (KEPPRA) 500 MG tablet.  Pharmacy: [id number] Pt has scheduled her 1 yr f/u and is on wait list, pt asking if she can get enough medication called in until her appointment

## 2022-10-10 ENCOUNTER — Other Ambulatory Visit: Payer: Self-pay | Admitting: Cardiovascular Disease

## 2022-12-13 ENCOUNTER — Other Ambulatory Visit: Payer: Self-pay | Admitting: Nurse Practitioner

## 2022-12-13 ENCOUNTER — Encounter: Payer: Self-pay | Admitting: Family Medicine

## 2022-12-13 DIAGNOSIS — Z1231 Encounter for screening mammogram for malignant neoplasm of breast: Secondary | ICD-10-CM

## 2023-01-02 ENCOUNTER — Other Ambulatory Visit: Payer: Self-pay | Admitting: Cardiovascular Disease

## 2023-01-03 ENCOUNTER — Other Ambulatory Visit: Payer: Self-pay

## 2023-01-03 MED ORDER — LEVETIRACETAM ER 500 MG PO TB24: 1000.0000 mg | ORAL_TABLET | Freq: Every day | ORAL | 0 refills | Status: DC

## 2023-01-06 ENCOUNTER — Ambulatory Visit
Admission: RE | Admit: 2023-01-06 | Discharge: 2023-01-06 | Disposition: A | Discharge status: Home / Self Care | Payer: BLUE CROSS/BLUE SHIELD | Source: Ambulatory Visit | Attending: Nurse Practitioner | Admitting: Nurse Practitioner

## 2023-01-06 DIAGNOSIS — Z1231 Encounter for screening mammogram for malignant neoplasm of breast: Secondary | ICD-10-CM

## 2023-01-25 ENCOUNTER — Encounter: Payer: Self-pay | Admitting: Nurse Practitioner

## 2023-01-25 ENCOUNTER — Encounter: Payer: Self-pay | Admitting: Family Medicine

## 2023-01-25 ENCOUNTER — Ambulatory Visit: Payer: BLUE CROSS/BLUE SHIELD | Admitting: Family Medicine

## 2023-01-25 VITALS — BP 138/88 | HR 72 | Ht 64.5 in | Wt 162.6 lb

## 2023-01-25 DIAGNOSIS — M545 Low back pain, unspecified: Secondary | ICD-10-CM

## 2023-01-25 MED ORDER — CARISOPRODOL 350 MG PO TABS: 350.0000 mg | ORAL_TABLET | Freq: Four times a day (QID) | ORAL | 0 refills | Status: DC | PRN

## 2023-01-25 MED ORDER — HYDROCODONE-ACETAMINOPHEN 7.5-325 MG PO TABS: 1.0000 | ORAL_TABLET | Freq: Four times a day (QID) | ORAL | 0 refills | Status: DC | PRN

## 2023-01-25 NOTE — Progress Notes (Signed)
   Subjective:    Patient ID: [name], female    DOB: 12-Nov-1960, 62 y.o.   MRN: [id number]  HPI She is here for consult concerning reoccurrence of low back pain.  Started in the low back and now has proceeded superiorly but no numbness, tingling or weakness.  She did try 1 Aleve with no success.  She has a previous history of low back pain and has been involved with pain management through Dr. Elisha Ponder.  She states that he apparently did some nerve blocking as well as epidural injections.  She was last being seen there about a year and a half ago.   Review of Systems     Objective:    Physical Exam She complains of pain with any motion.  No palpable tenderness.  Normal hip motion.  Knee DTRs difficult to assess but ankles with normal.  Normal hip motion with negative straight leg raising.       Assessment & Plan:  Acute midline low back pain without sciatica - Plan: carisoprodol (SOMA) 350 MG tablet, HYDROcodone-acetaminophen (NORCO) 7.5-325 MG tablet, Ambulatory referral to Pain Clinic Since this seems to be a reoccurrence I explained that I think the best thing would be to get reconnected with Dr. Elisha Ponder in spite of the fact that he is now in [place].  Explained that it was difficult to get her in with a new pain management person someone here in town.

## 2023-01-25 NOTE — Patient Instructions (Addendum)
take 2 Aleve twice per day regularly .take the other pain medicine and the muscle relaxers as needed on the prescription.  Will set you up to get an appointment with Dr. Jordan Likes

## 2023-01-26 NOTE — Progress Notes (Deleted)
PATIENT: [name] DOB: 1961-01-24  REASON FOR VISIT: follow up HISTORY FROM: patient  No chief complaint on file.    HISTORY OF PRESENT ILLNESS:  01/26/23 ALL: [name] returns for follow up for seizures. She was last seen 09/2020 and we switched her to lev XR 1000mg  daily. Since, .   09/23/20 ALL (Mychart): [name] is a 62 y.o. female here today for follow up for seizures. She continues levetiracetam 500mg  BID. She is tolerating medication well and denies recent seizure activity. She has a new job working nights and admits that she occasionally misses her evening dose of levetiracetam. She feels she would do better with once daily dosing. She continues regular follow up with PCP.   08/15/2019 ALL: [name] is a 61 y.o. female here today for follow up for seizures. She continues levetiracetam 500mg  twice daily. She denies seizure activity. She does note intermittent dizziness. BP has been up and down. She is followed by cardiology closely. She is under more stress at work. PCP advised sertraline and alprazolam. She has not started these medications.   HISTORY: (copied from my note on 06/15/2018)  [name] is a 62 y.o. female for follow up of seizure.  She reports that she is doing very well on levetiracetam 500 mg twice daily.  She does admit that she has taken the second dose of medication later than she should have.  She denies missed doses.  She denies any seizure activity. She denies any concerns at this time.     HISTORY (copied from [employer] note on 05/04/2017)    [name], [name] female returns for follow-up with history of complex partial seizure disorder.  Last seizure activity September 2013.  She is currently well controlled on Levetiracetam  500 mg twice daily.  She denies side effects to the medication.  No other medical issues since last seen.  She returns for reevaluation   UPDATE 11/11/15: Since last visit, doing well. No sz. Patient may be  moving to [place]. Otherwise doing well.    UPDATE 05/22/14: Since last visit, doing well. No seizures. Some intermittent eyelid and lip twitching. Int toe spasms. Overall doing well.    UPDATE 05/22/13: Doing well. No seizures since 11/11/11. Now on better schedule of LEV 500mg  BID, without missing doses. 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 This is challenging and has caused her increased stress. Also, her younger brother also diagnosed with seizure disorder now.   UPDATE 11/15/11:  Felt on Thurs (11/11/11) had feelings of having seizure; she felt "out of body".  She was in the car and she had someone to come and pick her up.  She was aware of the situation.  She has missed an average of 3 hs doses/wk of LEV 500mg .  She has had increased stress with her mother's declining health.  Having difficulty with putting thoughts together and repeating words.  She is also having increased anxiety.     UPDATE 01/05/11: Doing well.  Tolerating meds.  Back to driving.  No seizure since Jun 24, 2009.   UPDATE 11/04/09: Doing well; no further seizures.  Having intermittent twitching in toes up to thighs; sometimes bilateral, sometimes unilateral; no triggers.  Still with short temper.   PRIOR HPI (07/22/09): 62 year old right-handed female with hypertension and atrial fibrillation presenting for evaluation of possible seizure disorder. The patient has had 3 episodes in her life of altered or loss  of consciousness.  The first episode occurred in the 1990s when she was a mall and a "strange feeling" with pain over her. She felt as though she was about to pass out. She was able to sit down and did not lose consciousness. She was somewhat confused. Her brother came to pick her up and she did not seek medical attention. The second episode occurred in 2004 when she woke up in the middle of the night feeling very hot. She went to the kitchen and the next thing  she remembers is on the floor with her daughter standing over her. She was evaluated in the hospital MRI of the brain revealed a right parietal periventricular focus of gliosis (which I reviewed and agree with report).  Followup MRIs demonstrated stability of this lesion. June 24, 2009 the patient was at the beauty salon, when she all of a sudden she began to profusely sweating. She felt as though she was about to go into the base. In PT she gave her a walker she was able to drink and she ended up going into a "stare" and that her eyes rolled back and she began to shake all over. Unclear what the duration of this event was. She did not get her tongue. He did not have urinary or bowel incontinence. Paramedics were called to the scene and reportedly she had a "weak pulse" (no documentation of this).  She was evaluated at [facility] emergency room with CT scan and MRI of the brain, which showed stability of the right parietal gliosis (I reviewed images and agree). I discussed case with the emergency room clinicians in the evening patient was started on levetiracetam 500 mg BID. Since discharge she is had no further events. She does have some fatigue and mood swings with this medication. In retrospect patient has been having episodes of dj vu and out of body experience. She denies olfactory hallucinations. She has a family history of seizure (sister).  She denies any prior head trauma. Denies meningitis or encephalitis. She did well in school (honor roll).  She is not driving presently   REVIEW OF SYSTEMS: Out of a complete 14 system review of symptoms, the patient complains only of the following symptoms, dizziness, seizure and all other reviewed systems are negative.  ALLERGIES: Allergies  Allergen Reactions   Penicillins Hives and Rash    Has patient had a PCN reaction causing immediate rash, facial/tongue/throat swelling, SOB or lightheadedness with hypotension: yes Has patient had a PCN reaction  causing severe rash involving mucus membranes or skin necrosis: No Has patient had a PCN reaction that required hospitalization No Has patient had a PCN reaction occurring within the last 10 years: No If all of the above answers are "NO", then may proceed with Cephalosporin use.     HOME MEDICATIONS: Outpatient Medications Prior to Visit  Medication Sig Dispense Refill   amLODipine (NORVASC) 5 MG tablet TAKE 1 TABLET BY MOUTH EVERY DAY 90 tablet 1   carisoprodol (SOMA) 350 MG tablet Take 1 tablet (350 mg total) by mouth 4 (four) times daily as needed for muscle spasms. 30 tablet 0   hydrochlorothiazide (HYDRODIURIL) 25 MG tablet TAKE 1 TABLET BY MOUTH EVERY DAY 90 tablet 2   HYDROcodone-acetaminophen (NORCO) 7.5-325 MG tablet Take 1 tablet by mouth every 6 (six) hours as needed for moderate pain (pain score 4-6). 30 tablet 0   HYDROcodone-acetaminophen (NORCO/VICODIN) 5-325 MG tablet as needed. (Patient not taking: Reported on 01/25/2023)  ibuprofen (ADVIL) 200 MG tablet Take 200 mg by mouth every 6 (six) hours as needed.     levETIRAcetam (KEPPRA XR) 500 MG 24 hr tablet Take 2 tablets (1,000 mg total) by mouth daily. 180 tablet 0   losartan (COZAAR) 100 MG tablet TAKE 1 TABLET(100 MG) BY MOUTH DAILY 90 tablet 3   metoprolol succinate (TOPROL-XL) 50 MG 24 hr tablet TAKE 1 TABLET(50 MG) BY MOUTH DAILY WITH OR IMMEDIATELY FOLLOWING A MEAL 90 tablet 3   No facility-administered medications prior to visit.    PAST MEDICAL HISTORY: Past Medical History:  Diagnosis Date   Atrial fibrillation (HCC)    Back pain    History of echocardiogram    Echo 5/17 - mild concentric LVH, vigorous LVEF, EF 65-70%, no RWMA, Gr 1 DD, normal RVEF, normal bilat atrial size   Hypertension    Seizures (HCC)    around 2014    PAST SURGICAL HISTORY: Past Surgical History:  Procedure Laterality Date   CESAREAN SECTION      FAMILY HISTORY: Family History  Problem Relation Age of Onset   Sickle cell  anemia Mother    Hypertension Mother    Heart attack Mother 31   Sickle cell trait Sister    Seizures Sister    Sickle cell trait Sister    Hypertension Brother    Hypertension Brother    Hypertension Brother    Hypertension Son    Breast cancer Neg Hx     SOCIAL HISTORY: Social History   Socioeconomic History   Marital status: Single    Spouse name: Minerva Areola   Number of children: 2   Years of education: College   Highest education level: Not on file  Occupational History   Occupation: health care billing specialist    Employer: OTHER    Comment: [employer]  Tobacco Use   Smoking status: Former    Current packs/day: 0.00    Average packs/day: 0.8 packs/day for 37.0 years (27.8 ttl pk-yrs)    Types: Cigarettes    Start date: 10/13/1979    Quit date: 10/12/2016    Years since quitting: 6.2   Smokeless tobacco: Never  Vaping Use   Vaping status: Never Used  Substance and Sexual Activity   Alcohol use: Yes    Alcohol/week: 0.0 standard drinks of alcohol    Comment: ocassionally on weekends    Drug use: No   Sexual activity: Yes    Partners: Male    Birth control/protection: Post-menopausal, Surgical    Comment: one partner since 2003  Other Topics Concern   Not on file  Social History Narrative   Patient lives with her daughter.   Her son lives in [place], independently.   Caffeine Use: 2-3 cups daily Coffee   Social Determinants of Health   Financial Resource Strain: Not on file  Food Insecurity: Not on file  Transportation Needs: Not on file  Physical Activity: Not on file  Stress: Not on file  Social Connections: Not on file  Intimate Partner Violence: Not on file      PHYSICAL EXAM  There were no vitals filed for this visit.  There is no height or weight on file to calculate BMI.  Generalized: Well developed, in no acute distress  Cardiology: normal rate and rhythm, no murmur noted Respiratory: clear to auscultation bilaterally  Neurological  examination  Mentation: Alert oriented to time, place, history taking. Follows all commands speech and language fluent Cranial nerve II-XII: Pupils were equal round  reactive to light. Extraocular movements were full, visual field were full on confrontational test. Facial sensation and strength were normal. Head turning and shoulder shrug  were normal and symmetric. Motor: The motor testing reveals 5 over 5 strength of all 4 extremities. Good symmetric motor tone is noted throughout.  Sensory: Sensory testing is intact to soft touch on all 4 extremities. No evidence of extinction is noted.  Coordination: Cerebellar testing reveals good finger-nose-finger and heel-to-shin bilaterally.  Gait and station: Gait is normal.   DIAGNOSTIC DATA (LABS, IMAGING, TESTING) - I reviewed patient records, labs, notes, testing and imaging myself where available.      No data to display           Lab Results  Component Value Date   WBC 8.1 06/21/2022   HGB 12.5 06/21/2022   HCT 39.2 06/21/2022   MCV 86 06/21/2022   PLT 252 06/21/2022      Component Value Date/Time   NA 140 06/21/2022 1403   K 3.5 06/21/2022 1403   CL 102 06/21/2022 1403   CO2 24 06/21/2022 1403   GLUCOSE 123 (H) 06/21/2022 1403   GLUCOSE 102 (H) 09/22/2016 1314   BUN 14 06/21/2022 1403   CREATININE 0.93 06/21/2022 1403   CREATININE 0.83 06/23/2015 1233   CALCIUM 9.6 06/21/2022 1403   PROT 6.8 06/21/2022 1403   ALBUMIN 4.4 06/21/2022 1403   AST 17 06/21/2022 1403   ALT 17 06/21/2022 1403   ALKPHOS 77 06/21/2022 1403   BILITOT 0.4 06/21/2022 1403   GFRNONAA 72 03/13/2019 1044   GFRAA 83 03/13/2019 1044   Lab Results  Component Value Date   CHOL 184 03/13/2019   HDL 76 03/13/2019   LDLCALC 91 03/13/2019   TRIG 96 03/13/2019   CHOLHDL 2.4 03/13/2019   No results found for: "HGBA1C" No results found for: "VITAMINB12" Lab Results  Component Value Date   TSH 0.700 06/21/2022       ASSESSMENT AND PLAN 62 y.o.  year old female  has a past medical history of Atrial fibrillation (HCC), Back pain, History of echocardiogram, Hypertension, and Seizures (HCC). here with   No diagnosis found.   [name] is doing well today.  She continues to tolerate levetiracetam 500 mg twice daily.  We will continue current treatment plan.  She was encouraged to avoid missed doses of medication.  We have discussed concerns of dizziness.  Blood pressure in the office was initially 159/97.  Recheck is 130/80.  Dizziness seems to be consistent with variable blood pressures.  She is followed closely by cardiology.  She monitors her blood pressures closely at home.  She was encouraged to discuss this with primary care or cardiology should she feel that symptoms continue.  We have discussed concerns of anxiety.  I have educated her on appropriate use of Zoloft and Xanax.  I encouraged her to consider these as directed by primary care.  We have also discussed trying melatonin or valerian root over-the-counter.  She is encouraged to stay well-hydrated.  Regular exercise advised.  She will follow-up with me in 1 year, sooner if needed.  She verbalizes understanding and agreement with this plan.   No orders of the defined types were placed in this encounter.    No orders of the defined types were placed in this encounter.     I spent 20 minutes with the patient. 50% of this time was spent counseling and educating patient on plan of care and medications.  Shawnie Dapper, FNP-C 01/26/2023, 4:06 PM [facility] [address] [place], [place] [address] [phone]

## 2023-01-31 ENCOUNTER — Ambulatory Visit: Payer: BLUE CROSS/BLUE SHIELD | Admitting: Family Medicine

## 2023-01-31 ENCOUNTER — Telehealth: Payer: Self-pay | Admitting: Family Medicine

## 2023-01-31 NOTE — Telephone Encounter (Signed)
Patient last seen on 09/24/22 Follow up scheduled on 03/07/23 Last filled on 01/31/23 #60 tablets(30 day supply)  I called patient and informed her that Rx was just filled today.

## 2023-01-31 NOTE — Telephone Encounter (Signed)
Pt request refill for levETIRAcetam (KEPPRA XR) 500 MG 24 hr tablet send to [id number]  Have rescheduled cancelled appt due to NP out on 12/830/24 at 8:00 am.

## 2023-02-02 ENCOUNTER — Encounter: Payer: BLUE CROSS/BLUE SHIELD | Admitting: Nurse Practitioner

## 2023-03-06 NOTE — Patient Instructions (Signed)
Below is our plan:  We will continue levetiracetam 510m twice daily   Please make sure you are consistent with timing of seizure medication. I recommend annual visit with primary care provider (PCP) for complete physical and routine blood work. I recommend daily intake of vitamin D (400-800iu) and calcium (800-1000mg ) for bone health. Discuss Dexa screening with PCP.   According to [place] law, you can not drive unless you are seizure / syncope free for at least 6 months and under physician's care.  Please maintain precautions. Do not participate in activities where a loss of awareness could harm you or someone else. No swimming alone, no tub bathing, no hot tubs, no driving, no operating motorized vehicles (cars, ATVs, motocycles, etc), lawnmowers, power tools or firearms. No standing at heights, such as rooftops, ladders or stairs. Avoid hot objects such as stoves, heaters, open fires. Wear a helmet when riding a bicycle, scooter, skateboard, etc. and avoid areas of traffic. Set your water heater to 120 degrees or less.  SUDEP is the sudden, unexpected death of someone with epilepsy, who was otherwise healthy. In SUDEP cases, no other cause of death is found when an autopsy is done. Each year, more than 1 in 1,000 people with epilepsy die from SUDEP. This is the leading cause of death in people with uncontrolled seizures. Until further answers are available, the best way to prevent SUDEP is to lower your risk by controlling seizures. Research has found that people with all types of epilepsy that experience convulsive seizures can be at risk.   Please make sure you are staying well hydrated. I recommend 50-60 ounces daily. Well balanced diet and regular exercise encouraged. Consistent sleep schedule with 6-8 hours recommended.   Please continue follow up with care team as directed.   Follow up with me in 1 year   You may receive a survey regarding today's visit. I encourage you to leave honest feed  back as I do use this information to improve patient care. Thank you for seeing me today!

## 2023-03-06 NOTE — Progress Notes (Unsigned)
PATIENT: [name] DOB: 1960-07-22  REASON FOR VISIT: follow up HISTORY FROM: patient  Chief Complaint  Patient presents with   Room 1    Pt is here Alone. Pt states that things have been going good with her Seizure disorder since her last appointment. Pt denies any new or recent Seizure Activity.      HISTORY OF PRESENT ILLNESS:  03/07/23 ALL: [name] returns for follow up for seizures. She was last seen 09/2020 and doing well on levetiracetam 500mg  BID. She admitted to missing dose regularly and was switched to XR dosing. Since, she reports doing well. She is tolerating med well without obvious adverse effects. No seizure events. She does have word finding difficulty at times. She doesn't sleep well. 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 She drives without difficulty. She is able to manage home, finances and medications.   09/23/20 ALL (Mychart): [name] is a 62 y.o. female here today for follow up for seizures. She continues levetiracetam 500mg  BID. She is tolerating medication well and denies recent seizure activity. She has a new job working nights and admits that she occasionally misses her evening dose of levetiracetam. She feels she would do better with once daily dosing. She continues regular follow up with PCP.   08/15/2019 ALL: [name] is a 62 y.o. female here today for follow up for seizures. She continues levetiracetam 500mg  twice daily. She denies seizure activity. She does note intermittent dizziness. BP has been up and down. She is followed by cardiology closely. She is under more stress at work. PCP advised sertraline and alprazolam. She has not started these medications.   HISTORY: (copied from my note on 06/15/2018)  [name] is a 62 y.o. female for follow up of seizure.  She reports that she is doing very well on levetiracetam 500 mg twice daily.  She does admit that she has taken the second dose of medication later than she should  have.  She denies missed doses.  She denies any seizure activity. She denies any concerns at this time.     HISTORY (copied from [employer] note on 05/04/2017)    Ms. Miyako, [place] female returns for follow-up with history of complex partial seizure disorder.  Last seizure activity September 2013.  She is currently well controlled on Levetiracetam  500 mg twice daily.  She denies side effects to the medication.  No other medical issues since last seen.  She returns for reevaluation   UPDATE 11/11/15: Since last visit, doing well. No sz. Patient may be moving to [place]. Otherwise doing well.    UPDATE 05/22/14: Since last visit, doing well. No seizures. Some intermittent eyelid and lip twitching. Int toe spasms. Overall doing well.    UPDATE 05/22/13: Doing well. No seizures since 11/11/11. Now on better schedule of LEV 500mg  BID, without missing doses. 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 This is challenging and has caused her increased stress. Also, her younger brother also diagnosed with seizure disorder now.   UPDATE 11/15/11:  Felt on Thurs (11/11/11) had feelings of having seizure; she felt "out of body".  She was in the car and she had someone to come and pick her up.  She was aware of the situation.  She has missed an average of 3 hs doses/wk of LEV 500mg .  She has had increased stress with her mother's declining health.  Having difficulty with  putting thoughts together and repeating words.  She is also having increased anxiety.     UPDATE 01/05/11: Doing well.  Tolerating meds.  Back to driving.  No seizure since Jun 24, 2009.   UPDATE 11/04/09: Doing well; no further seizures.  Having intermittent twitching in toes up to thighs; sometimes bilateral, sometimes unilateral; no triggers.  Still with short temper.   PRIOR HPI (07/22/09): 62 year old right-handed female with hypertension and atrial fibrillation presenting  for evaluation of possible seizure disorder. The patient has had 3 episodes in her life of altered or loss of consciousness.  The first episode occurred in the 1990s when she was a mall and a "strange feeling" with pain over her. She felt as though she was about to pass out. She was able to sit down and did not lose consciousness. She was somewhat confused. Her brother came to pick her up and she did not seek medical attention. The second episode occurred in 2004 when she woke up in the middle of the night feeling very hot. She went to the kitchen and the next thing she remembers is on the floor with her daughter standing over her. She was evaluated in the hospital MRI of the brain revealed a right parietal periventricular focus of gliosis (which I reviewed and agree with report).  Followup MRIs demonstrated stability of this lesion. June 24, 2009 the patient was at the beauty salon, when she all of a sudden she began to profusely sweating. She felt as though she was about to go into the base. In PT she gave her a walker she was able to drink and she ended up going into a "stare" and that her eyes rolled back and she began to shake all over. Unclear what the duration of this event was. She did not get her tongue. He did not have urinary or bowel incontinence. Paramedics were called to the scene and reportedly she had a "weak pulse" (no documentation of this).  She was evaluated at [facility] emergency room with CT scan and MRI of the brain, which showed stability of the right parietal gliosis (I reviewed images and agree). I discussed case with the emergency room clinicians in the evening patient was started on levetiracetam 500 mg BID. Since discharge she is had no further events. She does have some fatigue and mood swings with this medication. In retrospect patient has been having episodes of dj vu and out of body experience. She denies olfactory hallucinations. She has a family history of seizure (sister).   She denies any prior head trauma. Denies meningitis or encephalitis. She did well in school (honor roll).  She is not driving presently   REVIEW OF SYSTEMS: Out of a complete 14 system review of symptoms, the patient complains only of the following symptoms, insomnia, word finding difficutly and all other reviewed systems are negative.  ALLERGIES: Allergies  Allergen Reactions   Penicillins Hives and Rash    Has patient had a PCN reaction causing immediate rash, facial/tongue/throat swelling, SOB or lightheadedness with hypotension: yes Has patient had a PCN reaction causing severe rash involving mucus membranes or skin necrosis: No Has patient had a PCN reaction that required hospitalization No Has patient had a PCN reaction occurring within the last 10 years: No If all of the above answers are "NO", then may proceed with Cephalosporin use.     HOME MEDICATIONS: Outpatient Medications Prior to Visit  Medication Sig Dispense Refill   amLODipine (NORVASC) 5 MG tablet TAKE  1 TABLET BY MOUTH EVERY DAY 90 tablet 1   carisoprodol (SOMA) 350 MG tablet Take 1 tablet (350 mg total) by mouth 4 (four) times daily as needed for muscle spasms. 30 tablet 0   hydrochlorothiazide (HYDRODIURIL) 25 MG tablet TAKE 1 TABLET BY MOUTH EVERY DAY 90 tablet 2   HYDROcodone-acetaminophen (NORCO) 7.5-325 MG tablet Take 1 tablet by mouth every 6 (six) hours as needed for moderate pain (pain score 4-6). 30 tablet 0   ibuprofen (ADVIL) 200 MG tablet Take 200 mg by mouth every 6 (six) hours as needed.     losartan (COZAAR) 100 MG tablet TAKE 1 TABLET(100 MG) BY MOUTH DAILY 90 tablet 3   metoprolol succinate (TOPROL-XL) 50 MG 24 hr tablet TAKE 1 TABLET(50 MG) BY MOUTH DAILY WITH OR IMMEDIATELY FOLLOWING A MEAL 90 tablet 3   levETIRAcetam (KEPPRA XR) 500 MG 24 hr tablet Take 2 tablets (1,000 mg total) by mouth daily. 180 tablet 0   No facility-administered medications prior to visit.    PAST MEDICAL HISTORY: Past  Medical History:  Diagnosis Date   Atrial fibrillation (HCC)    Back pain    History of echocardiogram    Echo 5/17 - mild concentric LVH, vigorous LVEF, EF 65-70%, no RWMA, Gr 1 DD, normal RVEF, normal bilat atrial size   Hypertension    Seizures (HCC)    around 2014    PAST SURGICAL HISTORY: Past Surgical History:  Procedure Laterality Date   CESAREAN SECTION      FAMILY HISTORY: Family History  Problem Relation Age of Onset   Sickle cell anemia Mother    Hypertension Mother    Heart attack Mother 53   Sickle cell trait Sister    Seizures Sister    Sickle cell trait Sister    Hypertension Brother    Seizures Brother    Hypertension Brother    Hypertension Brother    Hypertension Son    Breast cancer Neg Hx     SOCIAL HISTORY: Social History   Socioeconomic History   Marital status: Single    Spouse name: Minerva Areola   Number of children: 2   Years of education: College   Highest education level: Not on file  Occupational History   Occupation: health care billing specialist    Employer: OTHER    Comment: [employer]  Tobacco Use   Smoking status: Former    Current packs/day: 0.00    Average packs/day: 0.8 packs/day for 37.0 years (27.8 ttl pk-yrs)    Types: Cigarettes    Start date: 10/13/1979    Quit date: 10/12/2016    Years since quitting: 6.4   Smokeless tobacco: Never  Vaping Use   Vaping status: Never Used  Substance and Sexual Activity   Alcohol use: Yes    Alcohol/week: 0.0 standard drinks of alcohol    Comment: ocassionally on weekends    Drug use: No   Sexual activity: Yes    Partners: Male    Birth control/protection: Post-menopausal, Surgical    Comment: one partner since 2003  Other Topics Concern   Not on file  Social History Narrative   Patient lives with her daughter.   Her son lives in [place], independently.   Caffeine Use: 2-3 cups daily Coffee   Social Drivers of Corporate investment banker Strain: Not on file  Food Insecurity: Not  on file  Transportation Needs: Not on file  Physical Activity: Not on file  Stress: Not on file  Social  Connections: Not on file  Intimate Partner Violence: Not on file      PHYSICAL EXAM  Vitals:   03/07/23 0752  BP: 135/87  Pulse: 82  Weight: 165 lb (74.8 kg)  Height: 5\' 4"  (1.626 m)    Body mass index is 28.32 kg/m.  Generalized: Well developed, in no acute distress  Cardiology: normal rate and rhythm, no murmur noted Respiratory: clear to auscultation bilaterally  Neurological examination  Mentation: Alert oriented to time, place, history taking. Follows all commands speech and language fluent Cranial nerve II-XII: Pupils were equal round reactive to light. Extraocular movements were full, visual field were full on confrontational test. Facial sensation and strength were normal. Head turning and shoulder shrug  were normal and symmetric. Motor: The motor testing reveals 5 over 5 strength of all 4 extremities. Good symmetric motor tone is noted throughout.  Sensory: Sensory testing is intact to soft touch on all 4 extremities. No evidence of extinction is noted.  Coordination: Cerebellar testing reveals good finger-nose-finger and heel-to-shin bilaterally.  Gait and station: Gait is normal.   DIAGNOSTIC DATA (LABS, IMAGING, TESTING) - I reviewed patient records, labs, notes, testing and imaging myself where available.      No data to display           Lab Results  Component Value Date   WBC 8.1 06/21/2022   HGB 12.5 06/21/2022   HCT 39.2 06/21/2022   MCV 86 06/21/2022   PLT 252 06/21/2022      Component Value Date/Time   NA 140 06/21/2022 1403   K 3.5 06/21/2022 1403   CL 102 06/21/2022 1403   CO2 24 06/21/2022 1403   GLUCOSE 123 (H) 06/21/2022 1403   GLUCOSE 102 (H) 09/22/2016 1314   BUN 14 06/21/2022 1403   CREATININE 0.93 06/21/2022 1403   CREATININE 0.83 06/23/2015 1233   CALCIUM 9.6 06/21/2022 1403   PROT 6.8 06/21/2022 1403   ALBUMIN 4.4  06/21/2022 1403   AST 17 06/21/2022 1403   ALT 17 06/21/2022 1403   ALKPHOS 77 06/21/2022 1403   BILITOT 0.4 06/21/2022 1403   GFRNONAA 72 03/13/2019 1044   GFRAA 83 03/13/2019 1044   Lab Results  Component Value Date   CHOL 184 03/13/2019   HDL 76 03/13/2019   LDLCALC 91 03/13/2019   TRIG 96 03/13/2019   CHOLHDL 2.4 03/13/2019   No results found for: "HGBA1C" No results found for: "VITAMINB12" Lab Results  Component Value Date   TSH 0.700 06/21/2022       ASSESSMENT AND PLAN 62 y.o. year old female  has a past medical history of Atrial fibrillation (HCC), Back pain, History of echocardiogram, Hypertension, and Seizures (HCC). here with     ICD-10-CM   1. Seizure disorder (HCC)  G40.909        Sherron is doing well today.  She will continue levetiracetam XR 1000mg  daily. I have encouraged her to increase physical activity. We have also discussed trying melatonin or valerian root over-the-counter.  She is encouraged to stay well-hydrated.  She will follow-up with me in 1 year, sooner if needed.  She verbalizes understanding and agreement with this plan.   No orders of the defined types were placed in this encounter.    Meds ordered this encounter  Medications   levETIRAcetam (KEPPRA XR) 500 MG 24 hr tablet    Sig: Take 2 tablets (1,000 mg total) by mouth daily.    Dispense:  180 tablet    Refill:  3    Supervising Provider:   Anson Fret [[id number]]      Shawnie Dapper, FNP-C 03/07/2023, 8:26 AM [facility] [address], [address] [place], [place] [address] [phone]

## 2023-03-07 ENCOUNTER — Encounter: Payer: Self-pay | Admitting: Family Medicine

## 2023-03-07 ENCOUNTER — Ambulatory Visit: Payer: BLUE CROSS/BLUE SHIELD | Admitting: Family Medicine

## 2023-03-07 VITALS — BP 135/87 | HR 82 | Ht 64.0 in | Wt 165.0 lb

## 2023-03-07 DIAGNOSIS — G40909 Epilepsy, unspecified, not intractable, without status epilepticus: Secondary | ICD-10-CM | POA: Diagnosis not present

## 2023-03-07 MED ORDER — LEVETIRACETAM ER 500 MG PO TB24: 1000.0000 mg | ORAL_TABLET | Freq: Every day | ORAL | 3 refills | Status: DC

## 2023-03-10 ENCOUNTER — Encounter: Payer: Self-pay | Admitting: Nurse Practitioner

## 2023-03-10 ENCOUNTER — Ambulatory Visit: Payer: BLUE CROSS/BLUE SHIELD | Admitting: Nurse Practitioner

## 2023-03-10 VITALS — BP 128/82 | HR 78 | Wt 165.0 lb

## 2023-03-10 DIAGNOSIS — M545 Low back pain, unspecified: Secondary | ICD-10-CM | POA: Diagnosis not present

## 2023-03-10 DIAGNOSIS — I10 Essential (primary) hypertension: Secondary | ICD-10-CM

## 2023-03-10 DIAGNOSIS — H60311 Diffuse otitis externa, right ear: Secondary | ICD-10-CM

## 2023-03-10 DIAGNOSIS — I480 Paroxysmal atrial fibrillation: Secondary | ICD-10-CM

## 2023-03-10 DIAGNOSIS — F418 Other specified anxiety disorders: Secondary | ICD-10-CM

## 2023-03-10 DIAGNOSIS — M170 Bilateral primary osteoarthritis of knee: Secondary | ICD-10-CM

## 2023-03-10 DIAGNOSIS — Z0000 Encounter for general adult medical examination without abnormal findings: Secondary | ICD-10-CM

## 2023-03-10 DIAGNOSIS — G40909 Epilepsy, unspecified, not intractable, without status epilepticus: Secondary | ICD-10-CM

## 2023-03-10 MED ORDER — BUSPIRONE HCL 5 MG PO TABS: 5.0000 mg | ORAL_TABLET | Freq: Three times a day (TID) | ORAL | 1 refills | Status: AC | PRN

## 2023-03-10 MED ORDER — CARISOPRODOL 350 MG PO TABS: 350.0000 mg | ORAL_TABLET | Freq: Four times a day (QID) | ORAL | 1 refills | Status: AC | PRN

## 2023-03-10 MED ORDER — HYDROCORTISONE-ACETIC ACID 1-2 % OT SOLN: 3.0000 [drp] | Freq: Three times a day (TID) | OTIC | 1 refills | Status: DC

## 2023-03-10 MED ORDER — HYDROCODONE-ACETAMINOPHEN 7.5-325 MG PO TABS: 1.0000 | ORAL_TABLET | Freq: Four times a day (QID) | ORAL | 0 refills | Status: AC | PRN

## 2023-03-10 NOTE — Patient Instructions (Signed)
 For all adult patients, I recommend A well balanced diet low in saturated fats, cholesterol, and moderation in carbohydrates.   This can be as simple as monitoring portion sizes and cutting back on sugary beverages such as soda and juice to start with.    Daily water consumption of at least 64 ounces.  Physical activity at least 180 minutes per week, if just starting out.   This can be as simple as taking the stairs instead of the elevator and walking 2-3 laps around the office  purposefully every day.   STD protection, partner selection, and regular testing if high risk.  Limited consumption of alcoholic beverages if alcohol is consumed.  For women, I recommend no more than 7 alcoholic beverages per week, spread out throughout the week.  Avoid binge drinking or consuming large quantities of alcohol in one setting.   Please let me know if you feel you may need help with reduction or quitting alcohol consumption.   Avoidance of nicotine, if used.  Please let me know if you feel you may need help with reduction or quitting nicotine use.   Daily mental health attention.  This can be in the form of 5 minute daily meditation, prayer, journaling, yoga, reflection, etc.   Purposeful attention to your emotions and mental state can significantly improve your overall wellbeing  and  Health.  Please know that I am here to help you with all of your health care goals and am happy to work with you to find a solution that works best for you.  The greatest advice I have received with any changes in life are to take it one step at a time, that even means if all you can focus on is the next 60 seconds, then do that and celebrate your victories.  With any changes in life, you will have set backs, and that is OK. The important thing to remember is, if you have a set back, it is not a failure, it is an opportunity to try again!  Health Maintenance Recommendations Screening Testing Mammogram Every 1 -2  years based on history and risk factors Starting at age 15 Pap Smear Ages 21-39 every 3 years Ages 9-65 every 5 years with HPV testing More frequent testing may be required based on results and history Colon Cancer Screening Every 1-10 years based on test performed, risk factors, and history Starting at age 56 Bone Density Screening Every 2-10 years based on history Starting at age 55 for women Recommendations for men differ based on medication usage, history, and risk factors AAA Screening One time ultrasound Men 102-69 years old who have every smoked Lung Cancer Screening Low Dose Lung CT every 12 months Age 76-80 years with a 30 pack-year smoking history who still smoke or who have quit within the last 15 years  Screening Labs Routine  Labs: Complete Blood Count (CBC), Complete Metabolic Panel (CMP), Cholesterol (Lipid Panel) Every 6-12 months based on history and medications May be recommended more frequently based on current conditions or previous results Hemoglobin A1c Lab Every 3-12 months based on history and previous results Starting at age 11 or earlier with diagnosis of diabetes, high cholesterol, BMI >26, and/or risk factors Frequent monitoring for patients with diabetes to ensure blood sugar control Thyroid Panel (TSH w/ T3 & T4) Every 6 months based on history, symptoms, and risk factors May be repeated more often if on medication HIV One time testing for all patients 85 and older May be  repeated more frequently for patients with increased risk factors or exposure Hepatitis C One time testing for all patients 18 and older May be repeated more frequently for patients with increased risk factors or exposure Gonorrhea, Chlamydia Every 12 months for all sexually active persons 13-24 years Additional monitoring may be recommended for those who are considered high risk or who have symptoms PSA Men 60-66 years old with risk factors Additional screening may be  recommended from age 30-69 based on risk factors, symptoms, and history  Vaccine Recommendations Tetanus Booster All adults every 10 years Flu Vaccine All patients 6 months and older every year COVID Vaccine All patients 12 years and older Initial dosing with booster May recommend additional booster based on age and health history HPV Vaccine 2 doses all patients age 29-26 Dosing may be considered for patients over 26 Shingles Vaccine (Shingrix) 2 doses all adults 55 years and older Pneumonia (Pneumovax 23) All adults 65 years and older May recommend earlier dosing based on health history Pneumonia (Prevnar 12) All adults 65 years and older Dosed 1 year after Pneumovax 23  Additional Screening, Testing, and Vaccinations may be recommended on an individualized basis based on family history, health history, risk factors, and/or exposure.

## 2023-03-10 NOTE — Progress Notes (Signed)
 [name], DNP, AGNP-c [facility] [address] [place], [place] [address] Main Office [phone]  BP 128/82   Pulse 78   Wt 165 lb (74.8 kg)   BMI 28.32 kg/m    Subjective:    Patient ID: [name], female    DOB: August 19, 1960, 63 y.o.   MRN: [id number]  HPI: [name] is a 63 y.o. female presenting on 03/10/2023 for comprehensive medical examination.   History of Present Illness Heavenly presents for her annual exam. 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 She reports feeling dread upon waking up in the morning knowing she has to go to work, and this anxiety persists throughout her long work hours, which can extend up to 16 hours a day. The patient also reports feeling constantly tired, which she attributes to her long work hours. She expresses a desire to retire soon, hoping that this will alleviate her anxiety and fatigue.   The patient also reports experiencing anxiety when driving on the highway. She expresses a desire for a medication that can be taken as needed to help manage these episodes of anxiety, rather than a daily medication.  In addition to anxiety, the patient reports experiencing ear pain for about a month, which she believes may be due to water trapped in the ear from washing her hair. The ear pain is intermittent, with periods of feeling stopped up and requiring manual manipulation to pop and clear it. At the time of the consultation, the patient reports the ear is currently feeling fine.   She also mentions having a bad knee and a bad back, which have led to discontinuation of her previous exercise routine of walking four miles daily.  The patient is currently on multiple medications, including amlodipine , hydrochlorothiazide , losartan , metoprolol , Soma , and hydrocodone . She also takes Keppra  for her AFib and has not had any recent seizures. She reports needing to  occasionally take hydrocodone  for pain related to her knee and back issues. The patient also mentions having a hair bump that is not currently painful but is waiting to come to a head. She denies any changes in bowel or bladder habits but does report some stomach rumbling and nasal congestion.  Pertinent items are noted in HPI.  IMMUNIZATIONS:   Flu Vaccine: Flu vaccine completed elsewhere this season. Record updated. Tetanus: Tetanus completed in the last 10 years COVID: Declined today. Information on where to obtain the vaccine was provided.  RSV: No  HEALTH MAINTENANCE: Pap Smear HM Status: is due and will be scheduled by patient in the near future Mammogram HM Status: is up to date Colon Cancer Screening HM Status: is up to date Bone Density HM Status: N/A Lung CT HM Status: was declined   Concerns with vision, hearing, or dentition: Yes: water in ears  Most Recent Depression Screen:     03/10/2023    3:21 PM 06/19/2021    2:49 PM 03/29/2019    9:19 AM 03/13/2019    9:47 AM 04/14/2017    8:32 AM  Depression screen PHQ 2/9  Decreased Interest 0 0 0 0 0  Down, Depressed, Hopeless 0 0 0 0 0  PHQ - 2 Score 0 0 0 0 0   Most Recent Anxiety Screen:     03/10/2023    3:22 PM 03/29/2019    9:19 AM  GAD 7 : Generalized Anxiety Score  Nervous, Anxious, on Edge 3 3  Control/stop worrying 3 3  Worry too much - different things 2 1  Trouble relaxing 3 3  Restless 1 0  Easily annoyed or irritable 1 1  Afraid - awful might happen 0 0  Total GAD 7 Score 13 11  Anxiety Difficulty Not difficult at all Not difficult at all   Most Recent Fall Screen:    03/10/2023    3:20 PM 06/19/2021    2:48 PM 03/13/2019    9:47 AM 11/11/2015    1:09 PM  Fall Risk   Falls in the past year? 0 0 0 No  Number falls in past yr: 0 0 0   Injury with Fall? 0 0 0   Risk for fall due to : No Fall Risks No Fall Risks    Follow up Falls evaluation completed Falls evaluation completed      Past medical history,  surgical history, medications, allergies, family history and social history reviewed with patient today and changes made to appropriate areas of the chart.  Past Medical History:  Past Medical History:  Diagnosis Date   Atrial fibrillation (HCC)    Back pain    Body mass index (BMI) of 25.0 to 25.9 in adult 06/22/2021   History of echocardiogram    Echo 5/17 - mild concentric LVH, vigorous LVEF, EF 65-70%, no RWMA, Gr 1 DD, normal RVEF, normal bilat atrial size   Hypertension    Seizures (HCC)    around 2014   Medications:  Current Outpatient Medications on File Prior to Visit  Medication Sig   amLODipine  (NORVASC ) 5 MG tablet TAKE 1 TABLET BY MOUTH EVERY DAY   hydrochlorothiazide  (HYDRODIURIL ) 25 MG tablet TAKE 1 TABLET BY MOUTH EVERY DAY   ibuprofen  (ADVIL ) 200 MG tablet Take 200 mg by mouth every 6 (six) hours as needed.   levETIRAcetam  (KEPPRA  XR) 500 MG 24 hr tablet Take 2 tablets (1,000 mg total) by mouth daily.   losartan  (COZAAR ) 100 MG tablet TAKE 1 TABLET(100 MG) BY MOUTH DAILY   metoprolol  succinate (TOPROL -XL) 50 MG 24 hr tablet TAKE 1 TABLET(50 MG) BY MOUTH DAILY WITH OR IMMEDIATELY FOLLOWING A MEAL   No current facility-administered medications on file prior to visit.   Surgical History:  Past Surgical History:  Procedure Laterality Date   CESAREAN SECTION     Allergies:  Allergies  Allergen Reactions   Penicillins Hives and Rash    Has patient had a PCN reaction causing immediate rash, facial/tongue/throat swelling, SOB or lightheadedness with hypotension: yes Has patient had a PCN reaction causing severe rash involving mucus membranes or skin necrosis: No Has patient had a PCN reaction that required hospitalization No Has patient had a PCN reaction occurring within the last 10 years: No If all of the above answers are NO, then may proceed with Cephalosporin use.    Family History:  Family History  Problem Relation Age of Onset   Sickle cell anemia Mother     Hypertension Mother    Heart attack Mother 25   Sickle cell trait Sister    Seizures Sister    Sickle cell trait Sister    Hypertension Brother    Seizures Brother    Hypertension Brother    Hypertension Brother    Hypertension Son    Breast cancer Neg Hx        Objective:    BP 128/82   Pulse 78   Wt 165 lb (74.8 kg)   BMI 28.32 kg/m   Wt Readings from Last 3 Encounters:  03/10/23 165 lb (74.8 kg)  03/07/23 165 lb (74.8 kg)  01/25/23 162 lb 9.6 oz (73.8 kg)    Physical Exam Vitals and nursing note reviewed.  HENT:     Head: Normocephalic.     Right Ear: Swelling and tenderness present. A middle ear effusion is present.     Left Ear: Hearing normal.  No middle ear effusion.     Nose: Nose normal.     Mouth/Throat:     Mouth: Mucous membranes are moist.     Pharynx: Posterior oropharyngeal erythema present.  Eyes:     Conjunctiva/sclera: Conjunctivae normal.  Neck:     Vascular: No carotid bruit.  Cardiovascular:     Rate and Rhythm: Normal rate and regular rhythm.     Pulses: Normal pulses.     Heart sounds: Murmur heard.  Pulmonary:     Effort: Pulmonary effort is normal.     Breath sounds: Normal breath sounds. No wheezing.  Abdominal:     General: Bowel sounds are normal. There is no distension.     Palpations: Abdomen is soft.     Tenderness: There is no abdominal tenderness. There is no right CVA tenderness, left CVA tenderness or guarding.  Musculoskeletal:        General: Swelling present.     Cervical back: Normal range of motion and neck supple.     Right lower leg: No edema.     Left lower leg: No edema.     Comments: Right knee swelling- chronic  Lymphadenopathy:     Cervical: No cervical adenopathy.  Skin:    General: Skin is warm and dry.     Capillary Refill: Capillary refill takes less than 2 seconds.  Neurological:     Mental Status: She is oriented to person, place, and time.     Sensory: No sensory deficit.     Motor: No weakness.      Gait: Gait normal.  Psychiatric:        Behavior: Behavior normal.      Results for orders placed or performed in visit on 03/10/23  CBC with Differential/Platelet   Collection Time: 03/10/23  4:36 PM  Result Value Ref Range   WBC 8.9 3.4 - 10.8 x10E3/uL   RBC 4.48 3.77 - 5.28 x10E6/uL   Hemoglobin 12.8 11.1 - 15.9 g/dL   Hematocrit 61.0 65.9 - 46.6 %   MCV 87 79 - 97 fL   MCH 28.6 26.6 - 33.0 pg   MCHC 32.9 31.5 - 35.7 g/dL   RDW 87.7 88.2 - 84.5 %   Platelets 255 150 - 450 x10E3/uL   Neutrophils 59 Not Estab. %   Lymphs 31 Not Estab. %   Monocytes 8 Not Estab. %   Eos 2 Not Estab. %   Basos 0 Not Estab. %   Neutrophils Absolute 5.2 1.4 - 7.0 x10E3/uL   Lymphocytes Absolute 2.8 0.7 - 3.1 x10E3/uL   Monocytes Absolute 0.7 0.1 - 0.9 x10E3/uL   EOS (ABSOLUTE) 0.2 0.0 - 0.4 x10E3/uL   Basophils Absolute 0.0 0.0 - 0.2 x10E3/uL   Immature Granulocytes 0 Not Estab. %   Immature Grans (Abs) 0.0 0.0 - 0.1 x10E3/uL  CMP14+EGFR   Collection Time: 03/10/23  4:36 PM  Result Value Ref Range   Glucose 95 70 - 99 mg/dL   BUN 18 8 - 27 mg/dL   Creatinine, Ser 9.11 0.57 - 1.00 mg/dL   eGFR 73 >40 fO/fpw/8.26   BUN/Creatinine Ratio 20  12 - 28   Sodium 140 134 - 144 mmol/L   Potassium 3.8 3.5 - 5.2 mmol/L   Chloride 102 96 - 106 mmol/L   CO2 25 20 - 29 mmol/L   Calcium  9.7 8.7 - 10.3 mg/dL   Total Protein 7.1 6.0 - 8.5 g/dL   Albumin 4.4 3.9 - 4.9 g/dL   Globulin, Total 2.7 1.5 - 4.5 g/dL   Bilirubin Total 0.4 0.0 - 1.2 mg/dL   Alkaline Phosphatase 63 44 - 121 IU/L   AST 15 0 - 40 IU/L   ALT 13 0 - 32 IU/L  Hemoglobin A1c   Collection Time: 03/10/23  4:36 PM  Result Value Ref Range   Hgb A1c MFr Bld 5.3 4.8 - 5.6 %   Est. average glucose Bld gHb Est-mCnc 105 mg/dL  LP+LDL Direct   Collection Time: 03/10/23  4:36 PM  Result Value Ref Range   Cholesterol, Total 168 100 - 199 mg/dL   Triglycerides 80 0 - 149 mg/dL   HDL 75 >60 mg/dL   VLDL Cholesterol Cal 15 5 - 40 mg/dL    LDL Chol Calc (NIH) 78 0 - 99 mg/dL   LDL CALC COMMENT: Comment    LDL Direct 73 0 - 99 mg/dL       Assessment & Plan:   Problem List Items Addressed This Visit     Essential hypertension, benign   On amlodipine , losartan , and hydrochlorothiazide  for blood pressure management. BP well controlled at this time. - Continue current antihypertensive medications.      Relevant Orders   CBC with Differential/Platelet (Completed)   CMP14+EGFR (Completed)   Hemoglobin A1c (Completed)   LP+LDL Direct (Completed)   PAF (paroxysmal atrial fibrillation) (HCC)   Not currently on anticoagulation. HRRR today with no alarm symptoms present. Management with BB. Will continue to monitor. - continue your metoprolol   - follow-up with cardiology as directed      Relevant Orders   CBC with Differential/Platelet (Completed)   CMP14+EGFR (Completed)   Hemoglobin A1c (Completed)   LP+LDL Direct (Completed)   Seizure disorder (HCC)   On Keppra  for seizure management. No recent seizures reported. - Continue Keppra .       Encounter for annual physical exam - Primary   CPE completed today. Review of HM activities and recommendations discussed and provided on AVS. Anticipatory guidance, diet, and exercise recommendations provided. Medications, allergies, and hx reviewed and updated as necessary. Orders placed as listed below.  Plan: - Labs ordered. Will make changes as necessary based on results.  - I will review these results and send recommendations via MyChart or a telephone call.  - F/U with CPE in 1 year or sooner for acute/chronic health needs as directed.        Relevant Orders   CBC with Differential/Platelet (Completed)   CMP14+EGFR (Completed)   Hemoglobin A1c (Completed)   LP+LDL Direct (Completed)   Acute diffuse otitis externa of right ear   Ear pain and sensation of water in the ear following hair washing a month ago. Examination reveals an ear infection likely due to retained  moisture. Discussed use of ear drops to clear infection and prophylactic use when washing hair. - Prescribe ear drops to be used three times a day for 5-7 days. - Advise prophylactic use of ear drops when washing hair.      Relevant Medications   acetic acid -hydrocortisone  (VOSOL -HC) OTIC solution   Acute midline low back pain without sciatica   Ongoing concern  with back pain. Previously evaluated by my colleague several months ago and prescribed soma  and hydrocodone . Patient reports her pain persists and has stopped her from exercising regularly.  - I recommend stretching and low back rehab on AVS  - If pain persists in 4-6 weeks, I recommend further evaluation with spinal specialist - use medication as little as possible due to very high risks associated with ongoing use.       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 Experiences anxiety particularly in the mornings before work and while driving on the highway. Prefers as-needed medication over daily medication like Zoloft . Discussed Buspar  (buspirone ) as a non-sedating option. - Prescribe buspirone  (Buspar ) to be taken up to three times a day as needed.      Relevant Medications   busPIRone  (BUSPAR ) 5 MG tablet   Primary osteoarthritis of both knees   Chronic knee pain with swelling, exacerbated by prolonged walking and activity. Receives gel shots and occasional drainage for relief. Discussed continuing current management. - Continue gel shots and drainage as needed.      Relevant Medications   carisoprodol  (SOMA ) 350 MG tablet   HYDROcodone -acetaminophen  (NORCO) 7.5-325 MG tablet      Follow up plan: Return in about 1 year (around 03/09/2024) for CPE.  NEXT PREVENTATIVE PHYSICAL DUE IN 1 YEAR.  PATIENT COUNSELING PROVIDED FOR ALL ADULT  PATIENTS: A well balanced diet low in saturated fats, cholesterol, and moderation in carbohydrates.  This can be as simple as monitoring portion sizes and cutting back on sugary beverages such as soda and juice to start with.    Daily water consumption of at least 64 ounces.  Physical activity at least 180 minutes per week.  If just starting out, start 10 minutes a day and work your way up.   This can be as simple as taking the stairs instead of the elevator and walking 2-3 laps around the office  purposefully every day.   STD protection, partner selection, and regular testing if high risk.  Limited consumption of alcoholic beverages if alcohol is consumed. For men, I recommend no more than 14 alcoholic beverages per week, spread out throughout the week (max 2 per day). Avoid binge drinking or consuming large quantities of alcohol in one setting.  Please let me know if you feel you may need help with reduction or quitting alcohol consumption.   Avoidance of nicotine, if used. Please let me know if you feel you may need help with reduction or quitting nicotine use.   Daily mental health attention. This can be in the form of 5 minute daily meditation, prayer, journaling, yoga, reflection, etc.  Purposeful attention to your emotions and mental state can significantly improve your overall wellbeing  and  Health.  Please know that I am here to help you with all of your health care goals and am happy to work with you to find a solution that works best for you.  The greatest advice I have received with any changes in life are to take it one step at a time, that even means if all you can focus on is the next 60 seconds, then do that and celebrate your victories.  With any changes in life, you will have set backs, and that is OK. The important thing to remember is, if you have a  set back, it is not a failure, it is an opportunity to try again! Screening Testing Mammogram Every 1 -2 years based  on history and risk factors Starting at age 76 Pap Smear Ages 21-39 every 3 years Ages 57-65 every 5 years with HPV testing More frequent testing may be required based on results and history Colon Cancer Screening Every 1-10 years based on test performed, risk factors, and history Starting at age 40 Bone Density Screening Every 2-10 years based on history Starting at age 5 for women Recommendations for men differ based on medication usage, history, and risk factors AAA Screening One time ultrasound Men 64-57 years old who have every smoked Lung Cancer Screening Low Dose Lung CT every 12 months Age 58-80 years with a 30 pack-year smoking history who still smoke or who have quit within the last 15 years   Screening Labs Routine  Labs: Complete Blood Count (CBC), Complete Metabolic Panel (CMP), Cholesterol (Lipid Panel) Every 6-12 months based on history and medications May be recommended more frequently based on current conditions or previous results Hemoglobin A1c Lab Every 3-12 months based on history and previous results Starting at age 60 or earlier with diagnosis of diabetes, high cholesterol, BMI >26, and/or risk factors Frequent monitoring for patients with diabetes to ensure blood sugar control Thyroid  Panel (TSH) Every 6 months based on history, symptoms, and risk factors May be repeated more often if on medication HIV One time testing for all patients 67 and older May be repeated more frequently for patients with increased risk factors or exposure Hepatitis C One time testing for all patients 38 and older May be repeated more frequently for patients with increased risk factors or exposure Gonorrhea, Chlamydia Every 12 months for all sexually active persons 13-24 years Additional monitoring may be recommended for those who are considered high risk or who have symptoms Every 12 months for any woman on birth control, regardless of sexual activity PSA Men 21-54 years  old with risk factors Additional screening may be recommended from age 17-69 based on risk factors, symptoms, and history  Vaccine Recommendations Tetanus Booster All adults every 10 years Flu Vaccine All patients 6 months and older every year COVID Vaccine All patients 12 years and older Initial dosing with booster May recommend additional booster based on age and health history HPV Vaccine 2 doses all patients age 36-26 Dosing may be considered for patients over 26 Shingles Vaccine (Shingrix) 2 doses all adults 55 years and older Pneumonia (Pneumovax 78) All adults 65 years and older May recommend earlier dosing based on health history One year apart from Prevnar 16 Pneumonia (Prevnar 73) All adults 65 years and older Dosed 1 year after Pneumovax 23 Pneumonia (Prevnar 20) One time alternative to the two dosing of 13 and 23 For all adults with initial dose of 23, 20 is recommended 1 year later For all adults with initial dose of 13, 23 is still recommended as second option 1 year later

## 2023-03-11 ENCOUNTER — Other Ambulatory Visit (HOSPITAL_COMMUNITY): Payer: Self-pay

## 2023-03-11 ENCOUNTER — Telehealth: Payer: Self-pay

## 2023-03-11 LAB — CMP14+EGFR
ALT: 13 [IU]/L (ref 0–32)
AST: 15 [IU]/L (ref 0–40)
Albumin: 4.4 g/dL (ref 3.9–4.9)
Alkaline Phosphatase: 63 [IU]/L (ref 44–121)
BUN/Creatinine Ratio: 20 (ref 12–28)
BUN: 18 mg/dL (ref 8–27)
Bilirubin Total: 0.4 mg/dL (ref 0.0–1.2)
CO2: 25 mmol/L (ref 20–29)
Calcium: 9.7 mg/dL (ref 8.7–10.3)
Chloride: 102 mmol/L (ref 96–106)
Creatinine, Ser: 0.88 mg/dL (ref 0.57–1.00)
Globulin, Total: 2.7 g/dL (ref 1.5–4.5)
Glucose: 95 mg/dL (ref 70–99)
Potassium: 3.8 mmol/L (ref 3.5–5.2)
Sodium: 140 mmol/L (ref 134–144)
Total Protein: 7.1 g/dL (ref 6.0–8.5)
eGFR: 73 mL/min/{1.73_m2} (ref >59)

## 2023-03-11 LAB — CBC WITH DIFFERENTIAL/PLATELET
Basophils Absolute: 0 10*3/uL (ref 0.0–0.2)
Basos: 0 %
EOS (ABSOLUTE): 0.2 10*3/uL (ref 0.0–0.4)
Eos: 2 %
Hematocrit: 38.9 % (ref 34.0–46.6)
Hemoglobin: 12.8 g/dL (ref 11.1–15.9)
Immature Grans (Abs): 0 10*3/uL (ref 0.0–0.1)
Immature Granulocytes: 0 %
Lymphocytes Absolute: 2.8 10*3/uL (ref 0.7–3.1)
Lymphs: 31 %
MCH: 28.6 pg (ref 26.6–33.0)
MCHC: 32.9 g/dL (ref 31.5–35.7)
MCV: 87 fL (ref 79–97)
Monocytes Absolute: 0.7 10*3/uL (ref 0.1–0.9)
Monocytes: 8 %
Neutrophils Absolute: 5.2 10*3/uL (ref 1.4–7.0)
Neutrophils: 59 %
Platelets: 255 10*3/uL (ref 150–450)
RBC: 4.48 x10E6/uL (ref 3.77–5.28)
RDW: 12.2 % (ref 11.7–15.4)
WBC: 8.9 10*3/uL (ref 3.4–10.8)

## 2023-03-11 LAB — HEMOGLOBIN A1C
Est. average glucose Bld gHb Est-mCnc: 105 mg/dL
Hgb A1c MFr Bld: 5.3 % (ref 4.8–5.6)

## 2023-03-11 LAB — LP+LDL DIRECT
Cholesterol, Total: 168 mg/dL (ref 100–199)
HDL: 75 mg/dL (ref >39)
LDL Chol Calc (NIH): 78 mg/dL (ref 0–99)
LDL Direct: 73 mg/dL (ref 0–99)
Triglycerides: 80 mg/dL (ref 0–149)
VLDL Cholesterol Cal: 15 mg/dL (ref 5–40)

## 2023-03-11 NOTE — Telephone Encounter (Addendum)
 Pharmacy Patient Advocate Encounter   Received notification from CoverMyMeds /[facility] that prior authorization for HYDROcodone -Acetaminophen  7.5-325MG  tablets  is required/requested.   Insurance verification completed.   The patient is insured through [facility] .   Per test claim: PA required; PA submitted to above mentioned insurance via CoverMyMeds Key/confirmation #/[id number] HYDROcodone -Acetaminophen  7.5-325MG  tablets Status is pending

## 2023-03-11 NOTE — Telephone Encounter (Signed)
 Pharmacy Patient Advocate Encounter  Received notification from [facility] that Prior Authorization for HYDROcodone -Acetaminophen  7.5-325MG  tablets has been APPROVED from 1.3.25 to 725. Ran test claim, Copay is $6.05. This test claim was processed through [facility]- copay amounts may vary at other pharmacies due to pharmacy/plan contracts, or as the patient moves through the different stages of their insurance plan.   PA #/Case ID/Reference #: (Key: [id number])

## 2023-03-16 ENCOUNTER — Encounter: Payer: Self-pay | Admitting: Nurse Practitioner

## 2023-03-16 DIAGNOSIS — M170 Bilateral primary osteoarthritis of knee: Secondary | ICD-10-CM | POA: Insufficient documentation

## 2023-03-16 NOTE — Assessment & Plan Note (Signed)
 Not currently on anticoagulation. HRRR today with no alarm symptoms present. Management with BB. Will continue to monitor. - continue your metoprolol  - follow-up with cardiology as directed

## 2023-03-16 NOTE — Assessment & Plan Note (Signed)
 Ear pain and sensation of water in the ear following hair washing a month ago. Examination reveals an ear infection likely due to retained moisture. Discussed use of ear drops to clear infection and prophylactic use when washing hair. - Prescribe ear drops to be used three times a day for 5-7 days. - Advise prophylactic use of ear drops when washing hair.

## 2023-03-16 NOTE — Assessment & Plan Note (Signed)
 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 Experiences anxiety particularly in the mornings before work and while driving on the highway. Prefers as-needed medication over daily medication like Zoloft . Discussed Buspar  (buspirone ) as a non-sedating option. - Prescribe buspirone  (Buspar ) to be taken up to three times a day as needed.

## 2023-03-16 NOTE — Assessment & Plan Note (Signed)
 On Keppra for seizure management. No recent seizures reported. - Continue Keppra.

## 2023-03-16 NOTE — Assessment & Plan Note (Signed)

## 2023-03-16 NOTE — Assessment & Plan Note (Signed)
 Ongoing concern with back pain. Previously evaluated by my colleague several months ago and prescribed soma  and hydrocodone . Patient reports her pain persists and has stopped her from exercising regularly.  - I recommend stretching and low back rehab on AVS  - If pain persists in 4-6 weeks, I recommend further evaluation with spinal specialist - use medication as little as possible due to very high risks associated with ongoing use.

## 2023-03-16 NOTE — Assessment & Plan Note (Signed)
 On amlodipine, losartan, and hydrochlorothiazide for blood pressure management. BP well controlled at this time. - Continue current antihypertensive medications.

## 2023-03-16 NOTE — Assessment & Plan Note (Signed)
 Chronic knee pain with swelling, exacerbated by prolonged walking and activity. Receives gel shots and occasional drainage for relief. Discussed continuing current management. - Continue gel shots and drainage as needed.

## 2023-04-11 ENCOUNTER — Ambulatory Visit: Payer: BLUE CROSS/BLUE SHIELD | Admitting: Medical

## 2023-04-11 VITALS — BP 110/64 | HR 82 | Temp 97.0°F | Wt 166.4 lb

## 2023-04-11 DIAGNOSIS — H6993 Unspecified Eustachian tube disorder, bilateral: Secondary | ICD-10-CM | POA: Diagnosis not present

## 2023-04-11 DIAGNOSIS — H938X1 Other specified disorders of right ear: Secondary | ICD-10-CM | POA: Diagnosis not present

## 2023-04-11 DIAGNOSIS — H1031 Unspecified acute conjunctivitis, right eye: Secondary | ICD-10-CM

## 2023-04-11 MED ORDER — CLARITHROMYCIN 500 MG PO TABS: 500.0000 mg | ORAL_TABLET | Freq: Two times a day (BID) | ORAL | 0 refills | Status: DC

## 2023-04-11 NOTE — Patient Instructions (Signed)
Recommendations: Drink plenty of water over the next few days which helps open up the eustachian tubes Use warm compresses with a warm moist rag over the eye 20 minutes at a time Begin Biaxin oral antibiotic x 7 to 10 days Use either over-the-counter plain Mucinex or allergy medicine over the next few days to help with congestion in the eustachian tubes Consider over-the-counter Flonase nasal spray to help reduce inflammation in the sinuses and ear canals, do this daily for the next week If not much improved by Thursday let me know

## 2023-04-11 NOTE — Progress Notes (Signed)
Subjective:  [name] is a 63 y.o. female who presents for Chief Complaint  Patient presents with   right ear    Right ear infection. Suppose to be having surgery on mouth but has to have her ear clear before surgery     Here for possible right ear infection.  She notes having ear infection early January, was treated then, but still has some symptoms    She notes ringing in ear, some pain in right ear intermittent, right eye is draining some pus.     No sore throat, no cough, no fever, no body aches or chills.   No sinus pain.   Has intermittent teeth pain but is having some dental work done as well.    Has dental implant procedure for march, trying to get ear issue cleared up before then.  No other aggravating or relieving factors.    No other c/o.  Past Medical History:  Diagnosis Date   Atrial fibrillation (HCC)    Back pain    Body mass index (BMI) of 25.0 to 25.9 in adult 06/22/2021   History of echocardiogram    Echo 5/17 - mild concentric LVH, vigorous LVEF, EF 65-70%, no RWMA, Gr 1 DD, normal RVEF, normal bilat atrial size   Hypertension    Seizures (HCC)    around 2014   Current Outpatient Medications on File Prior to Visit  Medication Sig Dispense Refill   amLODipine (NORVASC) 5 MG tablet TAKE 1 TABLET BY MOUTH EVERY DAY 90 tablet 1   busPIRone (BUSPAR) 5 MG tablet Take 1-2 tablets (5-10 mg total) by mouth 3 (three) times daily as needed (anxiety during the day). 90 tablet 1   carisoprodol (SOMA) 350 MG tablet Take 1 tablet (350 mg total) by mouth 4 (four) times daily as needed for muscle spasms. 60 tablet 1   hydrochlorothiazide (HYDRODIURIL) 25 MG tablet TAKE 1 TABLET BY MOUTH EVERY DAY 90 tablet 2   levETIRAcetam (KEPPRA XR) 500 MG 24 hr tablet Take 2 tablets (1,000 mg total) by mouth daily. 180 tablet 3   losartan (COZAAR) 100 MG tablet TAKE 1 TABLET(100 MG) BY MOUTH DAILY 90 tablet 3   metoprolol succinate (TOPROL-XL) 50 MG 24 hr tablet TAKE 1 TABLET(50 MG)  BY MOUTH DAILY WITH OR IMMEDIATELY FOLLOWING A MEAL 90 tablet 3   acetic acid-hydrocortisone (VOSOL-HC) OTIC solution Place 3 drops into the right ear 3 (three) times daily. (Patient not taking: Reported on 04/11/2023) 10 mL 1   HYDROcodone-acetaminophen (NORCO) 7.5-325 MG tablet Take 1 tablet by mouth every 6 (six) hours as needed for moderate pain (pain score 4-6). (Patient not taking: Reported on 04/11/2023) 60 tablet 0   ibuprofen (ADVIL) 200 MG tablet Take 200 mg by mouth every 6 (six) hours as needed. (Patient not taking: Reported on 04/11/2023)     No current facility-administered medications on file prior to visit.    The following portions of the patient's history were reviewed and updated as appropriate: allergies, current medications, past family history, past medical history, past social history, past surgical history and problem list.  ROS Otherwise as in subjective above  Objective: BP 110/64   Pulse 82   Temp (!) 97 F (36.1 C)   Wt 166 lb 6.4 oz (75.5 kg)   BMI 28.56 kg/m   General appearance: alert, no distress, well developed, well nourished HEENT: normocephalic, sclerae anicteric, conjunctiva pink and moist, TMs with decreased light reflex, decreased contours, mild erythema on the right,  nares patent, no discharge or erythema, pharynx normal Oral cavity: MMM, no lesions, palate with torus Neck: supple, no lymphadenopathy, no thyromegaly, no masses Lungs: CTA bilaterally, no wheezes, rhonchi, or rales    Assessment: Encounter Diagnoses  Name Primary?   Eustachian tube dysfunction, bilateral Yes   Ear pressure, right    Acute conjunctivitis of right eye, unspecified acute conjunctivitis type      Plan: We discussed symptoms and concerns and recommendations as below.  Recommendations: Drink plenty of water over the next few days which helps open up the eustachian tubes Use warm compresses with a warm moist rag over the eye 20 minutes at a time Begin Biaxin oral  antibiotic x 7 to 10 days Use either over-the-counter plain Mucinex or allergy medicine over the next few days to help with congestion in the eustachian tubes Consider over-the-counter Flonase nasal spray to help reduce inflammation in the sinuses and ear canals, do this daily for the next week If not much improved by Thursday let me know  [name] was seen today for right ear.  Diagnoses and all orders for this visit:  Eustachian tube dysfunction, bilateral  Ear pressure, right  Acute conjunctivitis of right eye, unspecified acute conjunctivitis type  Other orders -     clarithromycin (BIAXIN) 500 MG tablet; Take 1 tablet (500 mg total) by mouth 2 (two) times daily.    Follow up: prn

## 2023-04-25 ENCOUNTER — Ambulatory Visit: Payer: BLUE CROSS/BLUE SHIELD | Admitting: Nurse Practitioner

## 2023-04-28 ENCOUNTER — Ambulatory Visit: Payer: Self-pay | Admitting: Nurse Practitioner

## 2023-04-28 NOTE — Telephone Encounter (Signed)
Noted. Looks like pt is scheduled already

## 2023-04-28 NOTE — Telephone Encounter (Signed)
Copied from CRM [id number]. Topic: Clinical - Red Word Triage >> Apr 28, 2023  8:15 AM Elle L wrote: Red Word that prompted transfer to Nurse Triage: The patient was seen on 2/3 for ear pain. She has finished the antibiotics. However, it is not better. Reason for Disposition  Ear congestion present > 48 hours  Answer Assessment - Initial Assessment Questions 1. LOCATION: "Which ear is involved?"       R ear   2. SENSATION: "Describe how the ear feels." (e.g. stuffy, full, plugged)."      R side feel congested/plugged  3. ONSET:  "When did the ear symptoms start?"       At least a few weeks ago, seen on 2/3  4. PAIN: "Do you also have an earache?" If Yes, ask: "How bad is it?" (Scale 1-10; or mild, moderate, severe)     No pain  5. URI: "Do you have a runny nose or cough?"      Runny nose  Protocols used: Ear - Congestion-A-AH   Chief Complaint: Ear Congestion Disposition: [] ED /[] Urgent Care (no appt availability in office) / [x] Appointment(In office/virtual)/ []  [facility]/ [] Home Care/ [] Refused Recommended Disposition /[] [facility]/ []  Follow-up with PCP Additional Notes:  Patient was recently seen for ear congestion, pain. She was given prescription for antibiotics at the time. She completed the antibiotics last Sunday and she has been taking allergy medication. There has been some improvement. She no longer has ear pain, but the congested feeling is still there. Patient stated she is supposed to have mouth surgery on March 7, and she needs to make sure this problem has cleared beforehand. Offered to schedule appointment for today or tomorrow, but the available times do not work with patient's schedule so she declined. Appointment scheduled for Monday. Advised patient that another option is urgent care if she desires. Patient decided to wait until Monday.

## 2023-05-02 ENCOUNTER — Ambulatory Visit: Payer: BLUE CROSS/BLUE SHIELD | Admitting: Medical

## 2023-05-02 ENCOUNTER — Encounter: Payer: Self-pay | Admitting: Medical

## 2023-05-02 VITALS — BP 120/74 | HR 68 | Wt 164.4 lb

## 2023-05-02 DIAGNOSIS — H68001 Unspecified Eustachian salpingitis, right ear: Secondary | ICD-10-CM | POA: Diagnosis not present

## 2023-05-02 DIAGNOSIS — H938X1 Other specified disorders of right ear: Secondary | ICD-10-CM | POA: Diagnosis not present

## 2023-05-02 MED ORDER — PSEUDOEPHEDRINE HCL 30 MG PO TABS: 30.0000 mg | ORAL_TABLET | Freq: Two times a day (BID) | ORAL | 0 refills | Status: AC

## 2023-05-02 MED ORDER — PREDNISONE 10 MG PO TABS: ORAL_TABLET | ORAL | 0 refills | Status: AC

## 2023-05-02 NOTE — Patient Instructions (Signed)
 Recommendations Continue with good water intake at least 80 to 100 ounces of water daily Over the next week you can use a combination of allergy pill such as Zyrtec or Benadryl in the evening and you can continue some Mucinex if desired Another option would be low-dose Sudafed over-the-counter such as 30 to 60 mg no more than twice daily for the next 5 days to help with decongestant.  You would need to monitor your blood pressure while you are on this just to make sure is not running elevated.  If so then stop the Sudafed.  I think you can probably tolerate a lower dose such as 30 mg twice a day for a few days to help with decongestant You can do nasal saline flush to help clear mucus out of the sinuses Do start back on Flonase nasal spray and use this for at least the next 2 weeks If you are not doing the remedy above and you still do not feel any improvement over the next 72 hours then begin round of prednisone that I sent to the pharmacy today

## 2023-05-02 NOTE — Progress Notes (Signed)
 Subjective:  [name] is a 63 y.o. female who presents for Chief Complaint  Patient presents with   ear muffled    Right ear muffled. Needed this to pop     Here for ear issue.  I saw her 04/11/2023 for eustachian tube dysfunction.  After that visit she ended up doing a round of Bactrim antibiotic, Flonase for a week and some Mucinex.  She got a lot better after that time but she still was having some right ear muffled.  Feels like it needs to pop or reduce pressure.  She has some drainage, some popping in the ear, cough some but no fever, no sore throat.  No nausea vomiting or dizziness  She has not used anything in the last week or 2 for the symptoms  She has had some facial sinus pain but improved with prior treatment above  No other aggravating or relieving factors.    No other c/o.  Past Medical History:  Diagnosis Date   Atrial fibrillation (HCC)    Back pain    Body mass index (BMI) of 25.0 to 25.9 in adult 06/22/2021   History of echocardiogram    Echo 5/17 - mild concentric LVH, vigorous LVEF, EF 65-70%, no RWMA, Gr 1 DD, normal RVEF, normal bilat atrial size   Hypertension    Seizures (HCC)    around 2014   Current Outpatient Medications on File Prior to Visit  Medication Sig Dispense Refill   amLODipine (NORVASC) 5 MG tablet TAKE 1 TABLET BY MOUTH EVERY DAY 90 tablet 1   busPIRone (BUSPAR) 5 MG tablet Take 1-2 tablets (5-10 mg total) by mouth 3 (three) times daily as needed (anxiety during the day). 90 tablet 1   carisoprodol (SOMA) 350 MG tablet Take 1 tablet (350 mg total) by mouth 4 (four) times daily as needed for muscle spasms. 60 tablet 1   hydrochlorothiazide (HYDRODIURIL) 25 MG tablet TAKE 1 TABLET BY MOUTH EVERY DAY 90 tablet 2   HYDROcodone-acetaminophen (NORCO) 7.5-325 MG tablet Take 1 tablet by mouth every 6 (six) hours as needed for moderate pain (pain score 4-6). 60 tablet 0   ibuprofen (ADVIL) 200 MG tablet Take 200 mg by mouth every 6 (six) hours  as needed.     levETIRAcetam (KEPPRA XR) 500 MG 24 hr tablet Take 2 tablets (1,000 mg total) by mouth daily. 180 tablet 3   losartan (COZAAR) 100 MG tablet TAKE 1 TABLET(100 MG) BY MOUTH DAILY 90 tablet 3   metoprolol succinate (TOPROL-XL) 50 MG 24 hr tablet TAKE 1 TABLET(50 MG) BY MOUTH DAILY WITH OR IMMEDIATELY FOLLOWING A MEAL 90 tablet 3   No current facility-administered medications on file prior to visit.    The following portions of the patient's history were reviewed and updated as appropriate: allergies, current medications, past family history, past medical history, past social history, past surgical history and problem list.  ROS Otherwise as in subjective above     Objective: BP 120/74   Pulse 68   Wt 164 lb 6.4 oz (74.6 kg)   BMI 28.22 kg/m   General appearance: alert, no distress, well developed, well nourished HEENT: normocephalic, sclerae anicteric, conjunctiva pink and moist, moderate cerumen right ear canal but what limited area I can see of the TM appears normal, left TM normal, nares patent, no discharge or erythema, pharynx normal Oral cavity: MMM, no lesions Neck: supple, no lymphadenopathy, no thyromegaly, no masses Lungs: CTA bilaterally, no wheezes, rhonchi, or rales  Assessment: Encounter Diagnoses  Name Primary?   Ear pressure, right Yes   Salpingitis of right eustachian tube       Plan: Discussed findings.  Discussed risk/benefits of procedure and patient agrees to procedure. Successfully used warm water lavage to remove impacted cerumen from right ear canal. Patient tolerated procedure well. Advised they avoid using any cotton swabs or other devices to clean the ear canals.  Use basic hygiene as discussed.  Follow up prn.   Recommendations Continue with good water intake at least 80 to 100 ounces of water daily Over the next week you can use a combination of allergy pill such as Zyrtec or Benadryl in the evening and you can continue some  Mucinex if desired Another option would be low-dose Sudafed over-the-counter such as 30 to 60 mg no more than twice daily for the next 5 days to help with decongestant.  You would need to monitor your blood pressure while you are on this just to make sure is not running elevated.  If so then stop the Sudafed.  I think you can probably tolerate a lower dose such as 30 mg twice a day for a few days to help with decongestant You can do nasal saline flush to help clear mucus out of the sinuses Do start back on Flonase nasal spray and use this for at least the next 2 weeks If you are not doing the remedy above and you still do not feel any improvement over the next 72 hours then begin round of prednisone that I sent to the pharmacy today  [name] December "Sher-ron" was seen today for ear muffled.  Diagnoses and all orders for this visit:  Ear pressure, right  Salpingitis of right eustachian tube  Other orders -     predniSONE (DELTASONE) 10 MG tablet; 6 tablets all together day 1, 5 tablets day 2, 4 tablets day 3, 3 tablets day 4, 2 tablets day 5, 1 tablet day 6. -     pseudoephedrine (SUDAFED) 30 MG tablet; Take 1 tablet (30 mg total) by mouth in the morning and at bedtime.    Follow up: prn

## 2023-05-09 ENCOUNTER — Other Ambulatory Visit: Payer: Self-pay | Admitting: Cardiovascular Disease

## 2023-06-09 ENCOUNTER — Other Ambulatory Visit: Payer: Self-pay | Admitting: Cardiovascular Disease

## 2023-06-16 ENCOUNTER — Other Ambulatory Visit: Payer: Self-pay | Admitting: Cardiovascular Disease

## 2023-07-11 NOTE — Progress Notes (Unsigned)
 Cardiology Office Note    Patient Name: [name] Date of Encounter: 07/12/2023  Primary Care Provider:  Annella Kief, NP Primary Cardiologist:  Arnoldo Lapping, MD Primary Electrophysiologist: None   Past Medical History    Past Medical History:  Diagnosis Date   Atrial fibrillation (HCC)    Back pain    Body mass index (BMI) of 25.0 to 25.9 in adult 06/22/2021   History of echocardiogram    Echo 5/17 - mild concentric LVH, vigorous LVEF, EF 65-70%, no RWMA, Gr 1 DD, normal RVEF, normal bilat atrial size   Hypertension    Seizures (HCC)    around 2014    History of Present Illness  [name] is a 63 y.o. female with a PMH of paroxysmal AF, HTN, seizures, tobacco abuse who presents today for 1 year follow-up.  Ms. Steinhart was initially followed by Dr. Julane Ny and is currently followed by Dr. Arlester Ladd for management of AF and HTN.  She underwent a DCCV in 2008 and was seen initially by Dr. Arlester Ladd in 01/18/2012 and noted ongoing tobacco use of 1 pack/day.  She denies chest pain and shortness of breath during her visit.  She reported complaints of palpitations in 08/2017 and wore an event monitor that showed dominant sinus rhythm with rare PACs and PVCs and 1 brief run of atrial tachycardia.  She completed a 2D echo 03/2022 with normal EF of 60 to 65% and mild LVH and no significant valve abnormalities. She was last seen by Dr. Arlester Ladd on 06/21/2022 and endorsed ongoing heart palpitations.  She noted being under a lot of stress at work and denied any chest pain.  She was still smoking and drinking 2-3 beers per day.  She wore a ZIO monitor that showed predominantly sinus rhythm with no evidence of atrial fibrillation or pathologic pauses or bradycardia events.  Ms. Drawhorn presents today for annual follow-up.  During today's visit she reports no new complaints of chest pain, chest tightness, or new cardiac complaints since her last visit. Her current medications include metoprolol ,  losartan , hydrochlorothiazide , and Norvasc . She has a significant smoking history, having smoked since she was 15 or 63 years old. She has reduced her smoking to about a pack every two days, down from a pack a day. Stress contributes to her smoking habits, and she has attempted to quit in the past, notably when dental work made smoking painful. Her alcohol consumption has decreased from two to three beers daily to two beers or a glass of wine on particularly stressful days. She consumes about two cups of coffee daily, which she acknowledges may contribute to her palpitations. She has a history of a torn meniscus in her knee, for which she received a gel injection. The knee remains stiff and swollen, particularly after walking too much, which limits her ability to exercise. 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 She reports occasional cramps in her legs and toes, which she attributes to insufficient water intake. No pain in her legs while walking, except for the knee issue. Patient denies chest pain, palpitations, dyspnea, PND, orthopnea, nausea, vomiting, dizziness, syncope, edema, weight gain, or early satiety.  Discussed the use of AI scribe software for clinical note transcription with the patient, who gave verbal consent to proceed.  History of Present Illness      Review of Systems  Please see the history of present illness.    All  other systems reviewed and are otherwise negative except as noted above.  Physical Exam    Wt Readings from Last 3 Encounters:  07/12/23 167 lb 3.2 oz (75.8 kg)  05/02/23 164 lb 6.4 oz (74.6 kg)  04/11/23 166 lb 6.4 oz (75.5 kg)   VS: Vitals:   07/12/23 1316  BP: 128/70  Pulse: 75  SpO2: 95%  ,Body mass index is 28.7 kg/m. GEN: Well nourished, well developed in no acute distress Neck: No JVD; No carotid bruits Pulmonary: Clear to auscultation without rales, wheezing or rhonchi   Cardiovascular: Normal rate. Regular rhythm. Normal S1. Normal S2.   Murmurs: There is no murmur.  ABDOMEN: Soft, non-tender, non-distended EXTREMITIES:  No edema; No deformity   EKG/LABS/ Recent Cardiac Studies   ECG personally reviewed by me today -sinus rhythm with rate of 60 bpm and no acute changes consistent with previous EKG.  Risk Assessment/Calculations:    CHA2DS2-VASc Score = 4   This indicates a 4.8% annual risk of stroke. The patient's score is based upon: CHF History: 0 HTN History: 1 Diabetes History: 0 Stroke History: 0 Vascular Disease History: 1 Age Score: 1 Gender Score: 1         Lab Results  Component Value Date   WBC 8.9 03/10/2023   HGB 12.8 03/10/2023   HCT 38.9 03/10/2023   MCV 87 03/10/2023   PLT 255 03/10/2023   Lab Results  Component Value Date   CREATININE 0.88 03/10/2023   BUN 18 03/10/2023   NA 140 03/10/2023   K 3.8 03/10/2023   CL 102 03/10/2023   CO2 25 03/10/2023   Lab Results  Component Value Date   CHOL 168 03/10/2023   HDL 75 03/10/2023   LDLCALC 78 03/10/2023   LDLDIRECT 73 03/10/2023   TRIG 80 03/10/2023   CHOLHDL 2.4 03/13/2019    Lab Results  Component Value Date   HGBA1C 5.3 03/10/2023   Assessment & Plan    Assessment and Plan Assessment & Plan Palpitations Intermittent palpitations, often stress-induced. Less frequent. Caffeine and chocolate may contribute. - Advise reducing caffeine intake. - Advise reducing chocolate consumption.  Hypertension Blood pressure well-controlled. Current medications include metoprolol , losartan , hydrochlorothiazide , and Norvasc . No swelling with Norvasc . - Continue Norvasc  5 mg, HCTZ 25 mg, losartan  100 mg and Toprol  50 mg daily  Nicotine dependence Long history of smoking, currently 12 cigarettes per day. Discussed importance of quitting and potential health benefits. Not ready to quit. Discussed Wellbutrin for smoking cessation and anxiety management. Encouraged to  contact 1-800-QUIT-NOW for support. Discussed need for CT of the chest to screen for lung nodules and CT calcium score for coronary artery plaque assessment. - Encourage contacting 1-800-QUIT-NOW for smoking cessation support. - Discuss Wellbutrin with primary care physician for smoking cessation and anxiety management. - Order CT of the chest without contrast to screen for lung nodules. - Order CT calcium score to assess coronary artery plaque.  Knee osteoarthritis Right knee osteoarthritis with history of torn meniscus. Gel injections provide some relief. Reports stiffness and swelling with excessive walking. Exercise limited due to knee pain. - Encourage low-impact exercises such as recumbent biking. - Advise fitting exercise into routine despite busy schedule.    1.  Paroxysmal AF: - Sinus rhythm today with rate of 75 bpm with reportedly no current episodes of AF occasional reports of palpitations. -Intermittent palpitations, often stress-induced. Less frequent. Caffeine and chocolate may contribute. -Continue Toprol  50 mg daily - Advise reducing caffeine intake. - Advise reducing  chocolate consumption  2.  Essential hypertension: - Patient's blood pressure today was 128/70 -Continue Norvasc  5 mg, HCTZ 25 mg, losartan  100 mg and Toprol  50 mg daily  3.  Multiple coronary risk factors: - Patient has long history of tobacco abuse along with hypertension and atrial fibrillation - Order CT calcium score to assess coronary artery plaque.  4.  Tobacco abuse: -Long history of smoking, currently 12 cigarettes per day. Discussed importance of quitting and potential health benefits.  Encouraged to contact 1-800-QUIT-NOW for support.  - Order CT of the chest without contrast to screen for lung nodules.  Disposition: Follow-up with Arnoldo Lapping, MD or APP in 12 months    Signed, Francene Ing, Retha Cast, NP 07/12/2023, 5:45 PM [facility]

## 2023-07-12 ENCOUNTER — Other Ambulatory Visit: Payer: Self-pay | Admitting: Cardiovascular Disease

## 2023-07-12 ENCOUNTER — Ambulatory Visit: Attending: Nurse Practitioner | Admitting: Nurse Practitioner

## 2023-07-12 ENCOUNTER — Encounter: Payer: Self-pay | Admitting: Nurse Practitioner

## 2023-07-12 VITALS — BP 128/70 | HR 75 | Ht 64.0 in | Wt 167.2 lb

## 2023-07-12 DIAGNOSIS — F418 Other specified anxiety disorders: Secondary | ICD-10-CM | POA: Diagnosis not present

## 2023-07-12 DIAGNOSIS — I10 Essential (primary) hypertension: Secondary | ICD-10-CM

## 2023-07-12 DIAGNOSIS — Z720 Tobacco use: Secondary | ICD-10-CM | POA: Diagnosis not present

## 2023-07-12 DIAGNOSIS — I480 Paroxysmal atrial fibrillation: Secondary | ICD-10-CM | POA: Diagnosis not present

## 2023-07-12 DIAGNOSIS — Z9189 Other specified personal risk factors, not elsewhere classified: Secondary | ICD-10-CM

## 2023-07-12 NOTE — Patient Instructions (Signed)
 Medication Instructions:  Your physician recommends that you continue on your current medications as directed. Please refer to the Current Medication list given to you today. *If you need a refill on your cardiac medications before your next appointment, please call your pharmacy*  Lab Work: None Ordered If you have labs (blood work) drawn today and your tests are completely normal, you will receive your results only by: MyChart Message (if you have MyChart) OR A paper copy in the mail If you have any lab test that is abnormal or we need to change your treatment, we will call you to review the results.  Testing/Procedures: Cardiac CT Calcium Score Chest CT (wo contrast)  Follow-Up: At [facility], you and your health needs are our priority.  As part of our continuing mission to provide you with exceptional heart care, our providers are all part of one team.  This team includes your primary Cardiologist (physician) and Advanced Practice Providers or APPs (Physician Assistants and Nurse Practitioners) who all work together to provide you with the care you need, when you need it.  Your next appointment:   12 month(s)  Provider:   Arnoldo Lapping, MD    We recommend signing up for the patient portal called "MyChart".  Sign up information is provided on this After Visit Summary.  MyChart is used to connect with patients for Virtual Visits (Telemedicine).  Patients are able to view lab/test results, encounter notes, upcoming appointments, etc.  Non-urgent messages can be sent to your provider as well.   To learn more about what you can do with MyChart, go to [email or web address].   Other Instructions 1-800-QUIT-NOW

## 2023-07-15 NOTE — Addendum Note (Signed)
 Addended by: Jamal Mays on: 07/15/2023 07:20 AM   Modules accepted: Orders

## 2023-07-22 ENCOUNTER — Other Ambulatory Visit: Payer: Self-pay | Admitting: Cardiovascular Disease

## 2023-08-08 ENCOUNTER — Other Ambulatory Visit: Payer: Self-pay | Admitting: Cardiovascular Disease

## 2023-08-09 ENCOUNTER — Other Ambulatory Visit: Payer: Self-pay | Admitting: Cardiovascular Disease

## 2023-08-10 ENCOUNTER — Ambulatory Visit (HOSPITAL_COMMUNITY)
Admission: RE | Admit: 2023-08-10 | Source: Ambulatory Visit | Attending: Nurse Practitioner | Admitting: Nurse Practitioner

## 2023-08-17 ENCOUNTER — Ambulatory Visit (HOSPITAL_COMMUNITY)

## 2023-08-30 ENCOUNTER — Ambulatory Visit (HOSPITAL_COMMUNITY)
Admission: RE | Admit: 2023-08-30 | Discharge: 2023-08-30 | Disposition: A | Discharge status: Home / Self Care | Payer: Self-pay | Source: Ambulatory Visit | Attending: Nurse Practitioner | Admitting: Nurse Practitioner

## 2023-08-30 ENCOUNTER — Ambulatory Visit (HOSPITAL_COMMUNITY)
Admission: RE | Admit: 2023-08-30 | Discharge: 2023-08-30 | Disposition: A | Discharge status: Home / Self Care | Source: Ambulatory Visit | Attending: Nurse Practitioner | Admitting: Nurse Practitioner

## 2023-08-30 DIAGNOSIS — F418 Other specified anxiety disorders: Secondary | ICD-10-CM | POA: Diagnosis present

## 2023-08-30 DIAGNOSIS — I10 Essential (primary) hypertension: Secondary | ICD-10-CM | POA: Insufficient documentation

## 2023-08-30 DIAGNOSIS — I480 Paroxysmal atrial fibrillation: Secondary | ICD-10-CM | POA: Insufficient documentation

## 2023-08-30 DIAGNOSIS — Z720 Tobacco use: Secondary | ICD-10-CM | POA: Insufficient documentation

## 2023-08-30 DIAGNOSIS — Z9189 Other specified personal risk factors, not elsewhere classified: Secondary | ICD-10-CM | POA: Insufficient documentation

## 2023-09-01 ENCOUNTER — Ambulatory Visit: Payer: Self-pay | Admitting: Nurse Practitioner

## 2023-09-01 ENCOUNTER — Other Ambulatory Visit: Payer: Self-pay

## 2023-09-01 DIAGNOSIS — Z79899 Other long term (current) drug therapy: Secondary | ICD-10-CM

## 2023-09-01 MED ORDER — ROSUVASTATIN CALCIUM 5 MG PO TABS: 5.0000 mg | ORAL_TABLET | Freq: Every evening | ORAL | 3 refills | Status: AC

## 2023-09-01 MED ORDER — ASPIRIN 81 MG PO TBEC: 81.0000 mg | DELAYED_RELEASE_TABLET | Freq: Every day | ORAL | Status: AC

## 2023-09-01 NOTE — Progress Notes (Signed)
 Prescription has been sent to preferred pharmacy. Lab orders placed.

## 2023-09-05 IMAGING — MG MM DIGITAL SCREENING BILAT W/ TOMO AND CAD
8 series · 9 of 24 positions shown · non-contrast
Comparison: Previous exam(s).

CLINICAL DATA: Screening.

EXAM:
DIGITAL SCREENING BILATERAL MAMMOGRAM WITH TOMOSYNTHESIS AND CAD
TECHNIQUE: Bilateral screening digital craniocaudal and mediolateral oblique
mammograms were obtained. Bilateral screening digital breast
tomosynthesis was performed. The images were evaluated with
computer-aided detection.

[R MLO synth-2D]
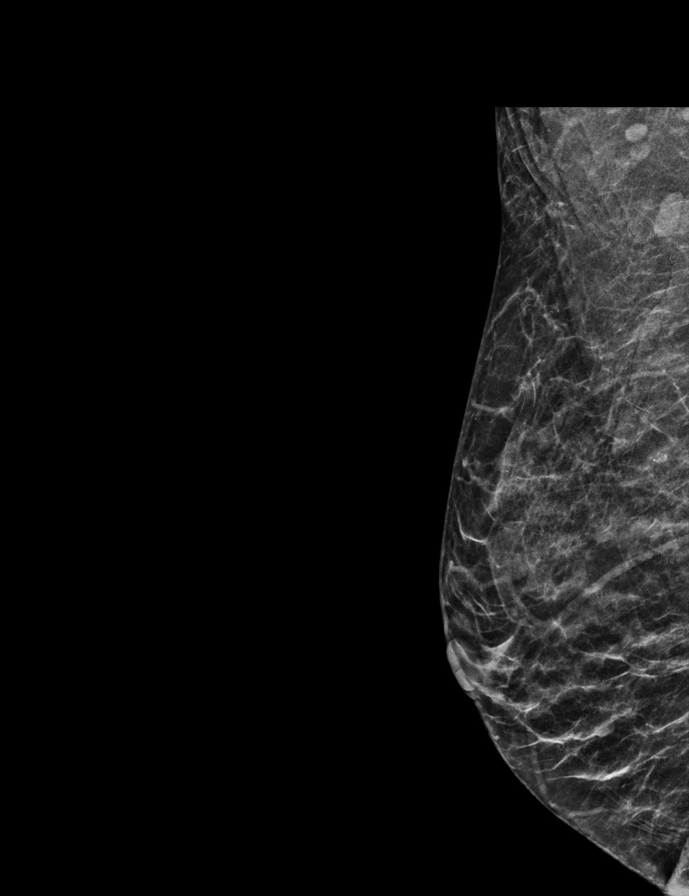

[L MLO synth-2D]
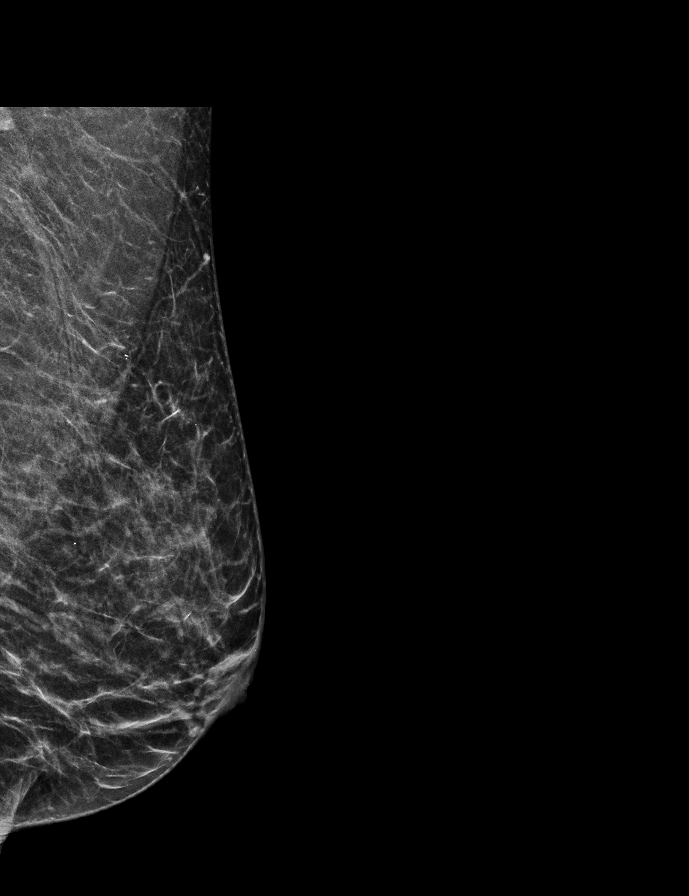

[R CC synth-2D]
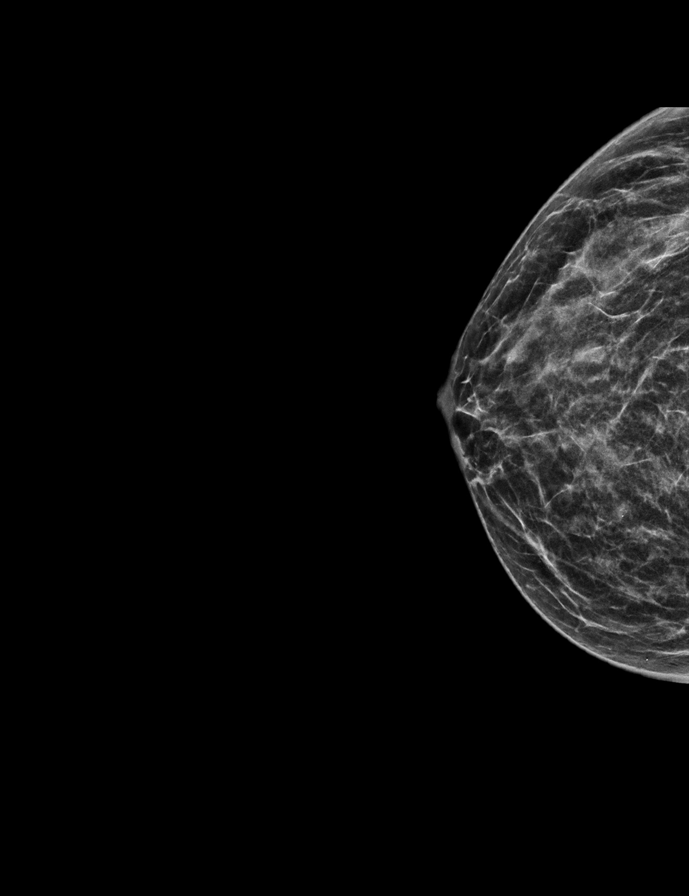

[L CC synth-2D]
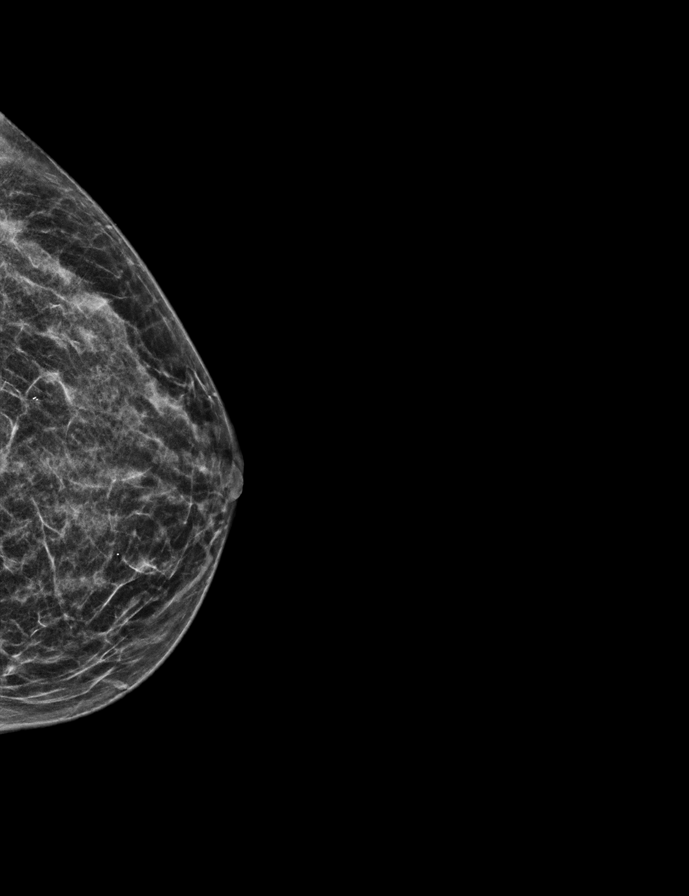

[L MLO tomo · 2 of 40 frames shown]
[frame 13/40]
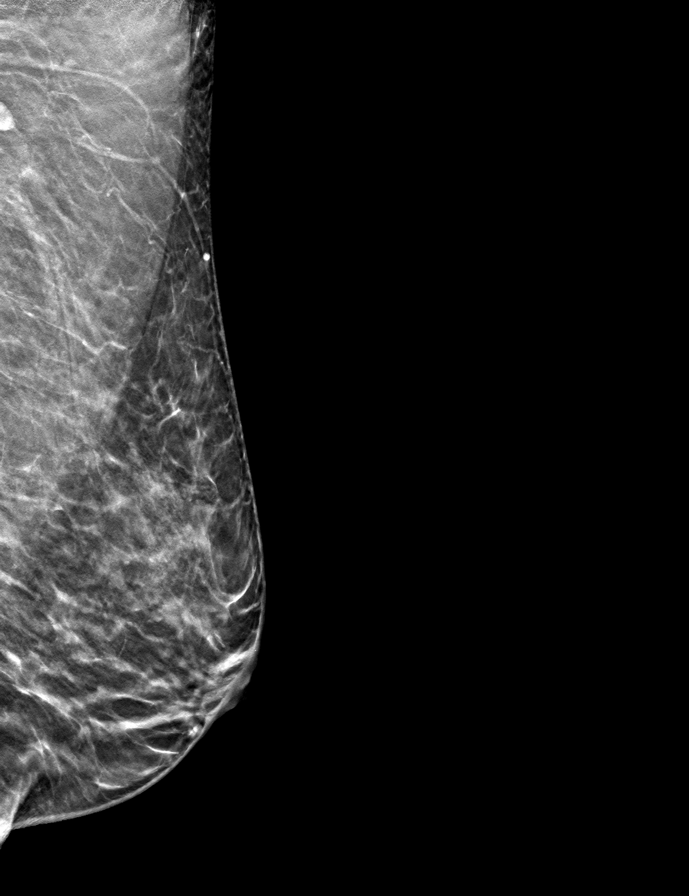
[frame 21/40]
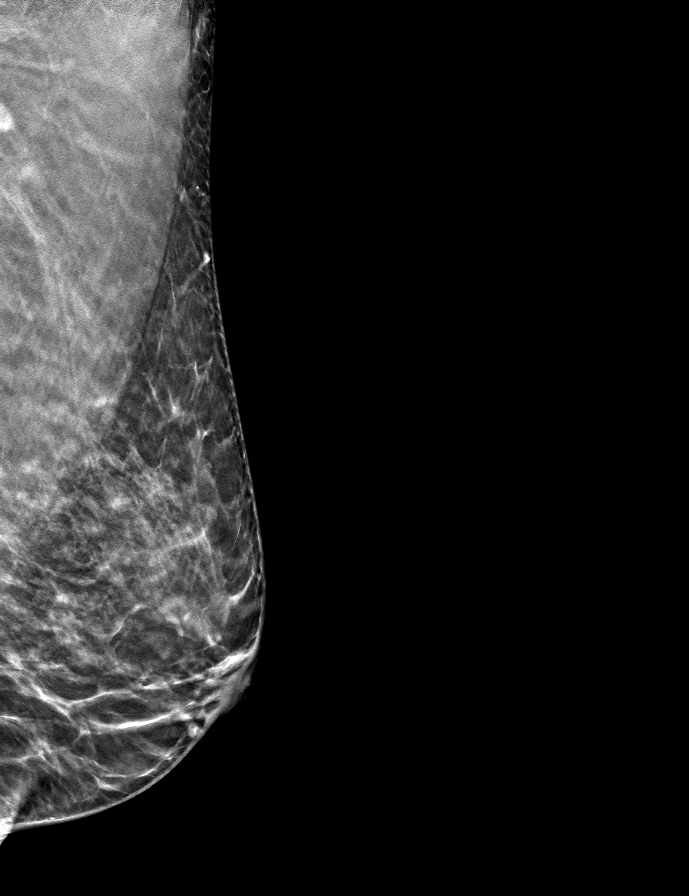

[R CC tomo · tomo slice 21/40.0]
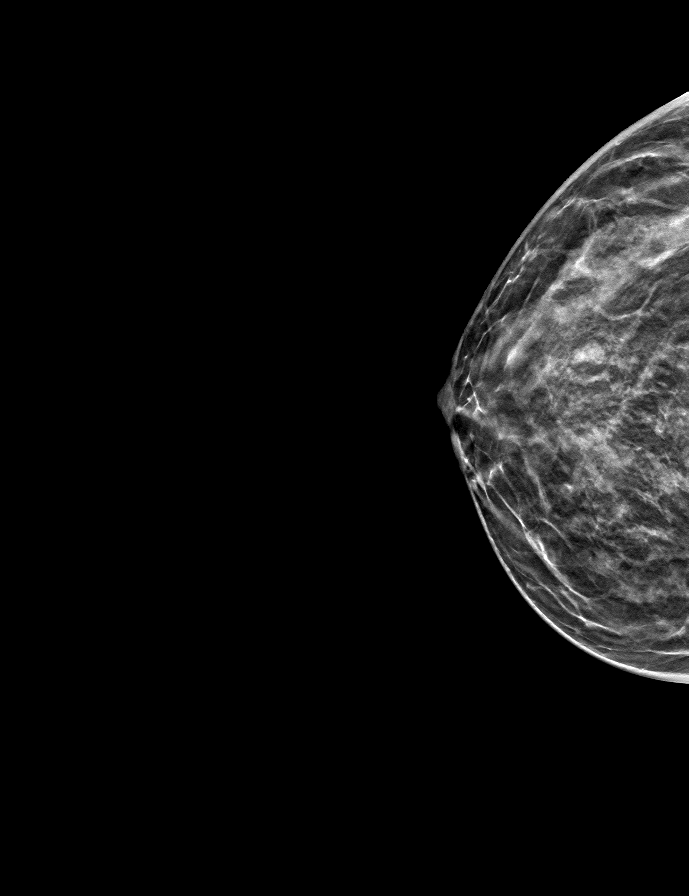

[R MLO tomo · tomo slice 22/43.0]
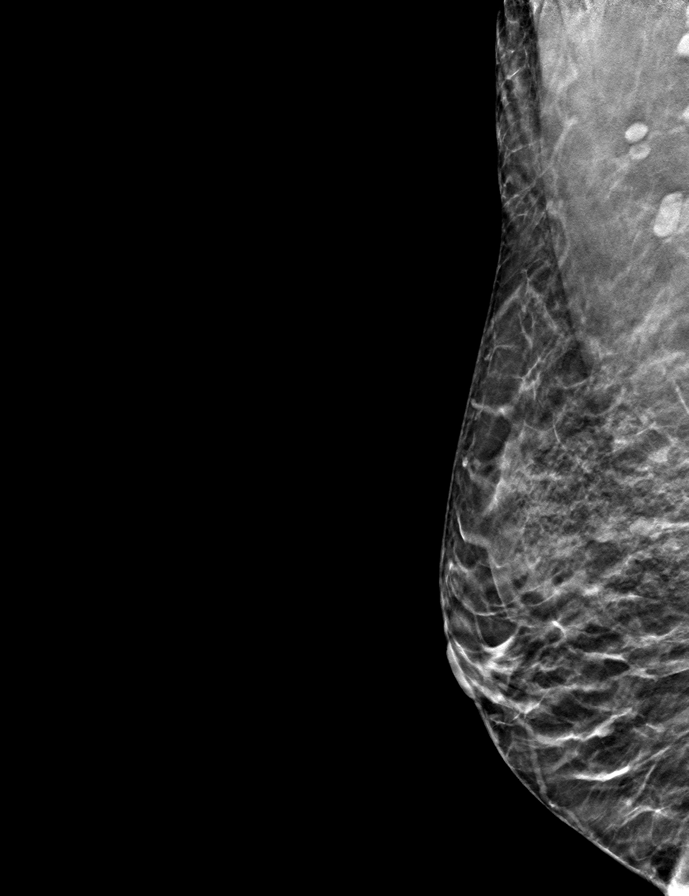

[L CC tomo · tomo slice 21/40.0]
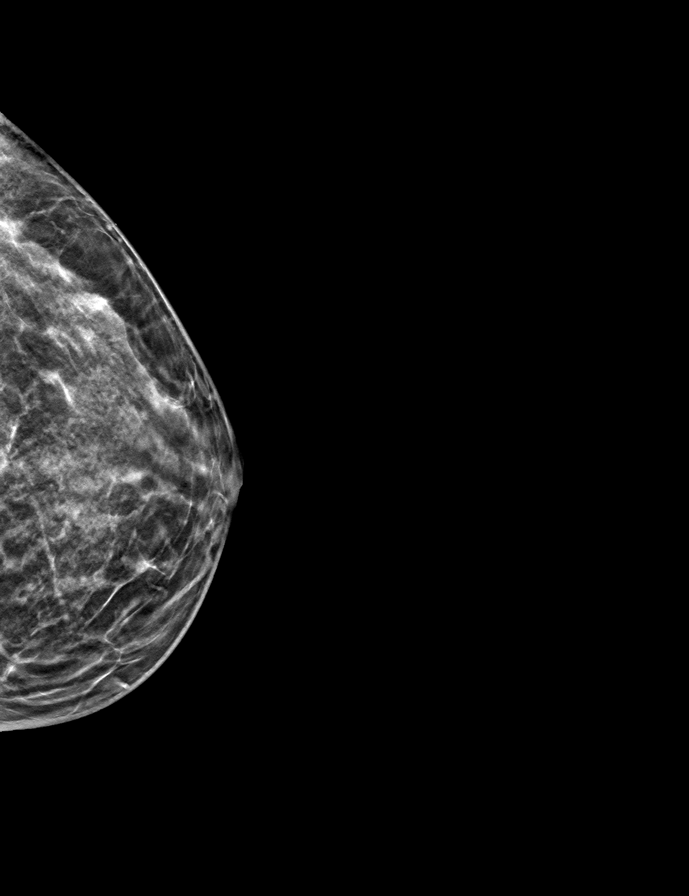

[9 of 24 positions shown; findings below may reference images not displayed]

ACR Breast Density Category c: The breast tissue is heterogeneously
dense, which may obscure small masses.
FINDINGS: There are no findings suspicious for malignancy.
IMPRESSION: No mammographic evidence of malignancy. A result letter of this
screening mammogram will be mailed directly to the patient.

RECOMMENDATION:
Screening mammogram in one year. (Code:Q3-W-BC3)

BI-RADS CATEGORY  1: Negative.

## 2023-09-07 ENCOUNTER — Other Ambulatory Visit: Payer: Self-pay

## 2023-09-07 DIAGNOSIS — Z79899 Other long term (current) drug therapy: Secondary | ICD-10-CM

## 2023-09-07 NOTE — Telephone Encounter (Signed)
 Pt is returning call to a nurse for results

## 2023-09-08 ENCOUNTER — Other Ambulatory Visit: Payer: Self-pay | Admitting: Cardiovascular Disease

## 2023-10-24 ENCOUNTER — Other Ambulatory Visit: Payer: Self-pay | Admitting: Cardiovascular Disease

## 2023-12-15 ENCOUNTER — Other Ambulatory Visit: Payer: Self-pay | Admitting: Cardiovascular Disease

## 2024-03-05 NOTE — Progress Notes (Unsigned)
 "  PATIENT: [name] DOB: Nov 12, 1960  REASON FOR VISIT: follow up HISTORY FROM: patient  Virtual Visit via Mychart Video Note  I connected with [name] on 03/06/2024 at  8:00 AM EST by Mychart Video and verified that I am speaking with the correct person using two identifiers.   I discussed the limitations, risks, security and privacy concerns of performing an evaluation and management service by video and the availability of in person appointments. I also discussed with the patient that there may be a patient responsible charge related to this service. The patient expressed understanding and agreed to proceed.   History of Present Illness:  03/06/2024 ALL: [name] returns for follow up for history of seizures. She continues to do well. She is tolerating lev XR 1000mg  daily. She denies missed doses. No seizure activity. She drives without difficulty. She works full time. She lives alone. Able to manage home and finances. She is followed by Camie Doing, NP, regularly.   03/07/23 ALL: [name] returns for follow up for seizures. She was last seen 09/2020 and doing well on levetiracetam  500mg  BID. She admitted to missing dose regularly and was switched to XR dosing. Since, she reports doing well. She is tolerating med well without obvious adverse effects. No seizure events. She does have word finding difficulty at times. She doesn't sleep well. 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 She drives without difficulty. She is able to manage home, finances and medications.   09/23/2020 ALL (Mychart):  [name] is a 63 y.o. female here today for follow up for seizures. She continues levetiracetam  500mg  BID. She is tolerating medication well and denies recent seizure activity. She has a new job working nights and admits that she occasionally misses her evening dose of levetiracetam . She feels she would do better with once daily dosing. She continues regular follow up with PCP.    08/15/19 ALL:  [name] is a 63 y.o. female here today for follow up for seizures. She continues levetiracetam  500mg  twice daily. She denies seizure activity. She does note intermittent dizziness. BP has been up and down. She is followed by cardiology closely. She is under more stress at work. PCP advised sertraline  and alprazolam . She has not started these medications.    Observations/Objective:  Generalized: Well developed, in no acute distress  Mentation: Alert oriented to time, place, history taking. Follows all commands speech and language fluent   Assessment and Plan:  63 y.o. year old female  has a past medical history of Atrial fibrillation (HCC), Back pain, Body mass index (BMI) of 25.0 to 25.9 in adult (06/22/2021), History of echocardiogram, Hypertension, and Seizures (HCC). here with    ICD-10-CM   1. Situational anxiety  F41.8       [name] is doing well, today. We will continue levetiracetam  ER 1000mg  daily. She is aware to take two 500mg  tablets every morning. Healthy lifestyle habits and seizure precautions reviewed. She will follow up regularly with PCP for annula CPE. She will return to see neurology in 1 year.   No orders of the defined types were placed in this encounter.    Meds ordered this encounter  Medications   levETIRAcetam  (KEPPRA  XR) 500 MG 24 hr tablet    Sig: Take 2 tablets (1,000 mg total) by mouth daily.    Dispense:  180 tablet    Refill:  3    Supervising Provider:   YAN, YIJUN [phone]     Follow  Up Instructions:  I discussed the assessment and treatment plan with the patient. The patient was provided an opportunity to ask questions and all were answered. The patient agreed with the plan and demonstrated an understanding of the instructions.   The patient was advised to call back or seek an in-person evaluation if the symptoms worsen or if the condition fails to improve as anticipated.  I provided 15 minutes of non-face-to-face time  during this encounter. Patient located at their place of residence during Mychart visit. Provider is in the office.    Harvis Mabus, NP   "

## 2024-03-06 ENCOUNTER — Encounter: Payer: Self-pay | Admitting: Family Medicine

## 2024-03-06 ENCOUNTER — Telehealth (INDEPENDENT_AMBULATORY_CARE_PROVIDER_SITE_OTHER): Payer: BLUE CROSS/BLUE SHIELD | Admitting: Family Medicine

## 2024-03-06 DIAGNOSIS — F418 Other specified anxiety disorders: Secondary | ICD-10-CM

## 2024-03-06 MED ORDER — LEVETIRACETAM ER 500 MG PO TB24: 1000.0000 mg | ORAL_TABLET | Freq: Every day | ORAL | 3 refills | Status: AC

## 2024-03-06 NOTE — Patient Instructions (Signed)
 Below is our plan:  We will continue levetiracetam  XR 1000mg  daily.   Please make sure you are consistent with timing of seizure medication. I recommend annual visit with primary care provider (PCP) for complete physical and routine blood work. I recommend daily intake of vitamin D (400-800iu) and calcium (800-1000mg ) for bone health. Discuss Dexa screening with PCP.   According to [place] law, you can not drive unless you are seizure / syncope free for at least 6 months and under physician's care.  Please maintain precautions. Do not participate in activities where a loss of awareness could harm you or someone else. No swimming alone, no tub bathing, no hot tubs, no driving, no operating motorized vehicles (cars, ATVs, motocycles, etc), lawnmowers, power tools or firearms. No standing at heights, such as rooftops, ladders or stairs. Avoid hot objects such as stoves, heaters, open fires. Wear a helmet when riding a bicycle, scooter, skateboard, etc. and avoid areas of traffic. Set your water heater to 120 degrees or less.  SUDEP is the sudden, unexpected death of someone with epilepsy, who was otherwise healthy. In SUDEP cases, no other cause of death is found when an autopsy is done. Each year, more than 1 in 1,000 people with epilepsy die from SUDEP. This is the leading cause of death in people with uncontrolled seizures. Until further answers are available, the best way to prevent SUDEP is to lower your risk by controlling seizures. Research has found that people with all types of epilepsy that experience convulsive seizures can be at risk.  Please make sure you are staying well hydrated. I recommend 50-60 ounces daily. Well balanced diet and regular exercise encouraged. Consistent sleep schedule with 6-8 hours recommended.   Please continue follow up with care team as directed.   Follow up with me in 1 year   You may receive a survey regarding today's visit. I encourage you to leave honest feed back  as I do use this information to improve patient care. Thank you for seeing me today!

## 2024-03-14 ENCOUNTER — Encounter: Payer: BLUE CROSS/BLUE SHIELD | Admitting: Nurse Practitioner

## 2024-03-16 ENCOUNTER — Encounter: Payer: Self-pay | Admitting: Nurse Practitioner

## 2024-09-13 ENCOUNTER — Encounter: Admitting: Nurse Practitioner

## 2025-03-19 ENCOUNTER — Telehealth: Admitting: Family Medicine
